# Patient Record
Sex: Female | Born: 1950
Health system: Southern US, Community
[De-identification: ages and names within clinical notes are randomized; demographics above are authoritative.]

## PROBLEM LIST (undated history)

## (undated) ENCOUNTER — Emergency Department (HOSPITAL_BASED_OUTPATIENT_CLINIC_OR_DEPARTMENT_OTHER): Admission: EM | Payer: PPO | Source: Home / Self Care

## (undated) DIAGNOSIS — Z8543 Personal history of malignant neoplasm of ovary: Secondary | ICD-10-CM

## (undated) DIAGNOSIS — F4322 Adjustment disorder with anxiety: Secondary | ICD-10-CM

## (undated) DIAGNOSIS — F419 Anxiety disorder, unspecified: Secondary | ICD-10-CM

## (undated) DIAGNOSIS — M8580 Other specified disorders of bone density and structure, unspecified site: Secondary | ICD-10-CM

## (undated) DIAGNOSIS — R911 Solitary pulmonary nodule: Secondary | ICD-10-CM

## (undated) DIAGNOSIS — K219 Gastro-esophageal reflux disease without esophagitis: Secondary | ICD-10-CM

## (undated) DIAGNOSIS — H269 Unspecified cataract: Secondary | ICD-10-CM

## (undated) DIAGNOSIS — Z8509 Personal history of malignant neoplasm of other digestive organs: Secondary | ICD-10-CM

## (undated) DIAGNOSIS — M1990 Unspecified osteoarthritis, unspecified site: Secondary | ICD-10-CM

## (undated) DIAGNOSIS — K2270 Barrett's esophagus without dysplasia: Secondary | ICD-10-CM

## (undated) DIAGNOSIS — E785 Hyperlipidemia, unspecified: Secondary | ICD-10-CM

## (undated) DIAGNOSIS — C801 Malignant (primary) neoplasm, unspecified: Secondary | ICD-10-CM

## (undated) DIAGNOSIS — R011 Cardiac murmur, unspecified: Secondary | ICD-10-CM

## (undated) HISTORY — DX: Malignant (primary) neoplasm, unspecified: C80.1

## (undated) HISTORY — DX: Gastro-esophageal reflux disease without esophagitis: K21.9

## (undated) HISTORY — DX: Unspecified cataract: H26.9

## (undated) HISTORY — DX: Solitary pulmonary nodule: R91.1

## (undated) HISTORY — PX: ARTHROSCOPIC REPAIR ACL: SUR80

## (undated) HISTORY — DX: Barrett's esophagus without dysplasia: K22.70

## (undated) HISTORY — DX: Unspecified osteoarthritis, unspecified site: M19.90

## (undated) HISTORY — PX: TONSILLECTOMY: SUR1361

## (undated) HISTORY — DX: Cardiac murmur, unspecified: R01.1

## (undated) HISTORY — DX: Hyperlipidemia, unspecified: E78.5

## (undated) HISTORY — PX: BREAST EXCISIONAL BIOPSY: SUR124

## (undated) HISTORY — DX: Other specified disorders of bone density and structure, unspecified site: M85.80

## (undated) HISTORY — PX: ABDOMINAL HYSTERECTOMY: SHX81

## (undated) HISTORY — DX: Personal history of malignant neoplasm of other digestive organs: Z85.09

## (undated) HISTORY — DX: Personal history of malignant neoplasm of ovary: Z85.43

## (undated) HISTORY — DX: Anxiety disorder, unspecified: F41.9

## (undated) HISTORY — PX: FEMUR FRACTURE SURGERY: SHX633

## (undated) HISTORY — DX: Adjustment disorder with anxiety: F43.22

---
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
## 2001-04-09 HISTORY — PX: ANKLE SURGERY: SHX546

## 2007-07-09 HISTORY — PX: VULVAR LESION REMOVAL: SHX5391

## 2007-11-08 HISTORY — PX: INGUINAL HERNIA REPAIR: SHX194

## 2016-04-09 DIAGNOSIS — C569 Malignant neoplasm of unspecified ovary: Secondary | ICD-10-CM

## 2016-04-09 HISTORY — DX: Malignant neoplasm of unspecified ovary: C56.9

## 2016-04-09 HISTORY — PX: APPENDECTOMY: SHX54

## 2016-10-03 LAB — BASIC METABOLIC PANEL
Glucose: 156
Glucose: 172
Potassium: 3.5 (ref 3.4–5.3)
Potassium: 3.9 (ref 3.4–5.3)
Sodium: 137 (ref 137–147)
Sodium: 139 (ref 137–147)

## 2016-10-03 LAB — CBC AND DIFFERENTIAL: Hemoglobin: 12.2 (ref 12.0–16.0)

## 2016-10-04 LAB — CBC AND DIFFERENTIAL
HCT: 36 (ref 36–46)
Hemoglobin: 11.7 — AB (ref 12.0–16.0)
Platelets: 192 (ref 150–399)
WBC: 8.9

## 2016-10-04 LAB — BASIC METABOLIC PANEL
BUN: 12 (ref 4–21)
Creatinine: 0.6 (ref 0.5–1.1)
Glucose: 109
Potassium: 3.6 (ref 3.4–5.3)
Sodium: 137 (ref 137–147)

## 2016-10-05 LAB — BASIC METABOLIC PANEL: Potassium: 3.5 (ref 3.4–5.3)

## 2016-10-06 LAB — BASIC METABOLIC PANEL
BUN: 11 (ref 4–21)
BUN: 11 (ref 4–21)
Creatinine: 0.5 (ref 0.5–1.1)
Creatinine: 0.5 (ref 0.5–1.1)
Glucose: 80
Glucose: 80
Potassium: 3.4 (ref 3.4–5.3)
Sodium: 138 (ref 137–147)
Sodium: 138 (ref 137–147)

## 2016-10-06 LAB — CBC AND DIFFERENTIAL
HCT: 35 — AB (ref 36–46)
Hemoglobin: 11.5 — AB (ref 12.0–16.0)
Neutrophils Absolute: 6
Platelets: 164 (ref 150–399)
WBC: 7.7

## 2016-10-07 LAB — BASIC METABOLIC PANEL
BUN: 11 (ref 4–21)
Creatinine: 0.5 (ref 0.5–1.1)
Glucose: 82
Potassium: 3.4 (ref 3.4–5.3)
Sodium: 138 (ref 137–147)

## 2016-10-07 LAB — CBC AND DIFFERENTIAL
HCT: 35 — AB (ref 36–46)
Hemoglobin: 11.5 — AB (ref 12.0–16.0)
Neutrophils Absolute: 7
Platelets: 202 (ref 150–399)
WBC: 8

## 2016-10-08 LAB — BASIC METABOLIC PANEL: Potassium: 3.7 (ref 3.4–5.3)

## 2016-10-09 LAB — BASIC METABOLIC PANEL: Potassium: 3.3 — AB (ref 3.4–5.3)

## 2017-05-11 LAB — CBC AND DIFFERENTIAL
HCT: 42 (ref 36–46)
Hemoglobin: 13.5 (ref 12.0–16.0)
Neutrophils Absolute: 2
Platelets: 206 (ref 150–399)
WBC: 4.1

## 2017-05-11 LAB — LIPID PANEL
Cholesterol: 215 — AB (ref 0–200)
HDL: 103 — AB (ref 35–70)
LDL Cholesterol: 105
LDl/HDL Ratio: 2.1
Triglycerides: 37 — AB (ref 40–160)

## 2017-05-11 LAB — HEPATIC FUNCTION PANEL
ALT: 22 (ref 7–35)
AST: 26 (ref 13–35)
Alkaline Phosphatase: 54 (ref 25–125)
Bilirubin, Total: 0.7

## 2017-05-11 LAB — BASIC METABOLIC PANEL
BUN: 21 (ref 4–21)
Creatinine: 0.7 (ref 0.5–1.1)
Glucose: 111
Potassium: 4.4 (ref 3.4–5.3)
Sodium: 141 (ref 137–147)

## 2017-05-11 LAB — HEMOGLOBIN A1C: Hemoglobin A1C: 6

## 2018-05-21 LAB — BASIC METABOLIC PANEL
BUN: 19 (ref 4–21)
Creatinine: 0.8 (ref 0.5–1.1)
Glucose: 101
Potassium: 4.1 (ref 3.4–5.3)
Sodium: 142 (ref 137–147)

## 2018-05-21 LAB — HEPATIC FUNCTION PANEL
ALT: 12 (ref 7–35)
AST: 18 (ref 13–35)
Alkaline Phosphatase: 47 (ref 25–125)
Bilirubin, Total: 0.7

## 2018-05-21 LAB — HEMOGLOBIN A1C: Hemoglobin A1C: 6.1

## 2018-05-21 LAB — CBC AND DIFFERENTIAL
HCT: 43 (ref 36–46)
Hemoglobin: 13.6 (ref 12.0–16.0)
Neutrophils Absolute: 3
Platelets: 218 (ref 150–399)
WBC: 4.5

## 2018-09-11 ENCOUNTER — Telehealth: Payer: Self-pay

## 2018-09-11 NOTE — Telephone Encounter (Signed)
Okay to schedule patient for a new patient appointment with Dr. Deborra Medina, thank you!

## 2018-09-11 NOTE — Telephone Encounter (Signed)
Copied from [place] [id number]. Topic: Appointment Scheduling - Scheduling Inquiry for Clinic >> Sep 10, 2018  4:03 PM Erick Blinks wrote: Reason for CRM: Pt is requesting to be scheduled with Dr. Deborra Medina, she is a medicare Health advantage, VM available. Virtual visit is  [phone]

## 2018-09-11 NOTE — Telephone Encounter (Signed)
Yes okay to schedule next week.

## 2018-09-16 ENCOUNTER — Encounter: Payer: Self-pay | Admitting: Family Medicine

## 2018-09-16 ENCOUNTER — Ambulatory Visit (INDEPENDENT_AMBULATORY_CARE_PROVIDER_SITE_OTHER): Payer: PPO | Admitting: Family Medicine

## 2018-09-16 VITALS — BP 110/90 | Ht 64.0 in | Wt 138.0 lb

## 2018-09-16 DIAGNOSIS — E782 Mixed hyperlipidemia: Secondary | ICD-10-CM | POA: Insufficient documentation

## 2018-09-16 DIAGNOSIS — C181 Malignant neoplasm of appendix: Secondary | ICD-10-CM | POA: Diagnosis not present

## 2018-09-16 DIAGNOSIS — E785 Hyperlipidemia, unspecified: Secondary | ICD-10-CM | POA: Diagnosis not present

## 2018-09-16 DIAGNOSIS — K219 Gastro-esophageal reflux disease without esophagitis: Secondary | ICD-10-CM | POA: Diagnosis not present

## 2018-09-16 DIAGNOSIS — Z8543 Personal history of malignant neoplasm of ovary: Secondary | ICD-10-CM | POA: Diagnosis not present

## 2018-09-16 DIAGNOSIS — F419 Anxiety disorder, unspecified: Secondary | ICD-10-CM | POA: Diagnosis not present

## 2018-09-16 MED ORDER — SERTRALINE HCL 25 MG PO TABS: ORAL_TABLET | ORAL | 0 refills | Status: DC

## 2018-09-16 NOTE — Assessment & Plan Note (Signed)
?   Ovarian and appendical CA.  Referral placed to oncology.  Awaiting records.

## 2018-09-16 NOTE — Assessment & Plan Note (Signed)
Compliant with statin- had blood work done in January.  Awaiting records.

## 2018-09-16 NOTE — Assessment & Plan Note (Signed)
She would like to wean completely off zoloft as she felt her anxiety was situational- I advised the following- since she has several 100 mg tablets of zoloft at home that are scored and easy to cut in half- she will take 50 mg (1/2 of her 100 mg tabs) every other day for 2 weeks. I then sent in 25 mg tablets of zoloft and advised that she take 1 tablet every other day for 1 weeks, then 1/2 tablet every other day for one week and stop.  She will call me mid wean and let me know how she is doing. The patient indicates understanding of these issues and agrees with the plan.'

## 2018-09-16 NOTE — Progress Notes (Signed)
Virtual Visit via Video   Due to the COVID-19 pandemic, this visit was completed with telemedicine (audio/video) technology to reduce patient and provider exposure as well as to preserve personal protective equipment.   I connected with Alta Corning by a video enabled telemedicine application and verified that I am speaking with the correct person using two identifiers. Location patient: Home Location provider: [facility], Office Persons participating in the virtual visit: Denetta Fei, Arnette Norris, MD   I discussed the limitations of evaluation and management by telemedicine and the availability of in person appointments. The patient expressed understanding and agreed to proceed.  Care Team   Patient Care Team: Lucille Passy, MD as PCP - General (Family Medicine)  Subjective:   HPI:   Very pleasant 68 year old female with h/o ovarian CA, HLD and anxiety who would like to establish care.  Moved to the area from [place] 3 weeks ago because she retired.  Her last CPE was in January and her labs were ok and we will get those records. Mammogram UTD. Last BMD was 10-years-ago. Colonoscopy is UTD. Will wait for records for blood work and immunizations.   She has a H/O Ovarian CA,  This was diagnosed in 06/2016.  S/p TAH/BSO.  Then determined the appendix was involved and appendectomy was done after that was found. She  will need a referral to Oncology for September. We have mailed records request to her.  Anxiety- She has anxiety and is stable taking Sertraline 100mg  1qd and would like to initiate weaning process.  She started this last year when work changed which was very stressful.    GAD 7 : Generalized Anxiety Score 09/16/2018  Nervous, Anxious, on Edge 0  Control/stop worrying 0  Worry too much - different things 0  Trouble relaxing 0  Restless 0  Easily annoyed or irritable 0  Afraid - awful might happen 0  Total GAD 7 Score 0  Anxiety Difficulty Not difficult at  all    Depression screen [facility] 2/9 09/16/2018  Decreased Interest 0  Down, Depressed, Hopeless 0  PHQ - 2 Score 0   HLD-   Taking Simvastatin 20mg .  No results found for: CHOL, HDL, LDLCALC, LDLDIRECT, TRIG, CHOLHDL    GERD- She would like to see about weaning off of Omeprazole as well.  ROS   Patient Active Problem List   Diagnosis Date Noted  . Anxiety 09/16/2018  . History of ovarian cancer 09/16/2018  . HLD (hyperlipidemia) 09/16/2018    Social History   Tobacco Use  . Smoking status: Former Smoker    Types: Cigarettes    Last attempt to quit: 1993    Years since quitting: 27.4  . Smokeless tobacco: Never Used  Substance Use Topics  . Alcohol use: Yes    Comment: Occas    Current Outpatient Medications:  .  omeprazole (PRILOSEC) 20 MG capsule, Take 20 mg by mouth daily., Disp: , Rfl:  .  sertraline (ZOLOFT) 100 MG tablet, Take 100 mg by mouth daily., Disp: , Rfl:  .  simvastatin (ZOCOR) 20 MG tablet, Take 20 mg by mouth at bedtime., Disp: , Rfl:   No Known Allergies  Objective:  BP 110/90 (Cuff Size: Normal)   Ht 5\' 4"  (1.626 m)   Wt 138 lb (62.6 kg)   BMI 23.69 kg/m   VITALS: Per patient if applicable, see vitals. GENERAL: Alert, appears well and in no acute distress. HEENT: Atraumatic, conjunctiva clear, no obvious  abnormalities on inspection of external nose and ears. NECK: Normal movements of the head and neck. CARDIOPULMONARY: No increased WOB. Speaking in clear sentences. I:E ratio WNL.  MS: Moves all visible extremities without noticeable abnormality. PSYCH: Pleasant and cooperative, well-groomed. Speech normal rate and rhythm. Affect is appropriate. Insight and judgement are appropriate. Attention is focused, linear, and appropriate.  NEURO: CN grossly intact. Oriented as arrived to appointment on time with no prompting. Moves both UE equally.  SKIN: No obvious lesions, wounds, erythema, or cyanosis noted on face or hands.  Depression screen PHQ  2/9 09/16/2018  Decreased Interest 0  Down, Depressed, Hopeless 0  PHQ - 2 Score 0    Assessment and Plan:   Hermila was seen today for new patient (initial visit) and addendum.  Diagnoses and all orders for this visit:  Anxiety  History of ovarian cancer  Hyperlipidemia, unspecified hyperlipidemia type    . COVID-19 Education: The signs and symptoms of COVID-19 were discussed with the patient and how to seek care for testing if needed. The importance of social distancing was discussed today. . Reviewed expectations re: course of current medical issues. . Discussed self-management of symptoms. . Outlined signs and symptoms indicating need for more acute intervention. . Patient verbalized understanding and all questions were answered. Marland Kitchen Health Maintenance issues including appropriate healthy diet, exercise, and smoking avoidance were discussed with patient. . See orders for this visit as documented in the electronic medical record.  Arnette Norris, MD  Records requested if needed. Time spent: 30 minutes, of which >50% was spent in obtaining information about her symptoms, reviewing her previous labs, evaluations, and treatments, counseling her about her condition (please see the discussed topics above), and developing a plan to further investigate it; she had a number of questions which I addressed.

## 2018-09-16 NOTE — Assessment & Plan Note (Signed)
Discussed GERD friendly diet.  And H2 blockers as first choice.  She would like to stop all acid reducers now that she is not under stress and eating better and keep me updated.

## 2018-10-01 ENCOUNTER — Telehealth: Payer: Self-pay | Admitting: Family Medicine

## 2018-10-01 NOTE — Telephone Encounter (Signed)
Can you get more information?  How does she feel crummy?

## 2018-10-01 NOTE — Telephone Encounter (Signed)
Dr. Aron - please advise 

## 2018-10-01 NOTE — Telephone Encounter (Signed)
Patient is calling stating she was suppose to let PCP know how she felt on new medication after 2 weeks: sertraline (ZOLOFT) 25 MG  Patient has felt okay, but the past two days patient states she has felt crummy.  Patient Call back # [phone]

## 2018-10-03 NOTE — Telephone Encounter (Signed)
Pt will try to take 1/2 tab qd x14d then 1/2 tab qod x14d for a slower titration and a more stable consistency of a low dose for the first 14d/thx dmf

## 2018-10-30 ENCOUNTER — Encounter: Payer: Self-pay | Admitting: Family Medicine

## 2018-10-30 NOTE — Progress Notes (Signed)
[facility] Provider Based License Group/thx dmf

## 2018-10-30 NOTE — Progress Notes (Signed)
[facility] Provider Based License Group/thx dmf

## 2018-10-31 ENCOUNTER — Encounter: Payer: Self-pay | Admitting: Family Medicine

## 2018-10-31 ENCOUNTER — Telehealth: Payer: Self-pay

## 2018-10-31 DIAGNOSIS — Z1159 Encounter for screening for other viral diseases: Secondary | ICD-10-CM

## 2018-10-31 DIAGNOSIS — E785 Hyperlipidemia, unspecified: Secondary | ICD-10-CM

## 2018-10-31 NOTE — Progress Notes (Signed)
[facility]/thx dmf

## 2018-10-31 NOTE — Progress Notes (Signed)
[facility]/thx dmf

## 2018-10-31 NOTE — Progress Notes (Signed)
[facility]/thx dmf

## 2018-10-31 NOTE — Progress Notes (Signed)
[facility]/thx dmf

## 2018-10-31 NOTE — Telephone Encounter (Signed)
I advised pt that we received all of her records and that we need to schedule the following in this order:  1.)AWV with [facility] - PNV-23 vaccine &  fasting labs same day (orders entered) 2.)Follow-up with Dr. Deborra Medina within 2 weeks to discuss labs  She will call back to schedule/thx dmf

## 2018-10-31 NOTE — Progress Notes (Signed)
[facility]/thx dmf

## 2018-10-31 NOTE — Progress Notes (Signed)
[facility]/thx dmf

## 2018-10-31 NOTE — Progress Notes (Signed)
[facility]/thx dmf

## 2018-10-31 NOTE — Progress Notes (Signed)
[facility]/thx dmf

## 2018-11-11 ENCOUNTER — Telehealth: Payer: Self-pay | Admitting: Family Medicine

## 2018-11-11 DIAGNOSIS — C181 Malignant neoplasm of appendix: Secondary | ICD-10-CM

## 2018-11-11 DIAGNOSIS — D373 Neoplasm of uncertain behavior of appendix: Secondary | ICD-10-CM

## 2018-11-11 DIAGNOSIS — Z8543 Personal history of malignant neoplasm of ovary: Secondary | ICD-10-CM

## 2018-11-11 NOTE — Telephone Encounter (Signed)
Relation to pt: self  Call back number: [phone]    Reason for call:  Patient states due to her being a cancer survivor CT checks are done every few months, patient requesting CT orders, please advise

## 2018-11-13 NOTE — Telephone Encounter (Signed)
TA-Pt requesting CT orders to monitor and be certain her cancer is still in remission/plz advise/thx dmf

## 2018-11-14 ENCOUNTER — Encounter: Payer: Self-pay | Admitting: Family Medicine

## 2018-11-14 ENCOUNTER — Telehealth: Payer: Self-pay | Admitting: Family Medicine

## 2018-11-14 NOTE — Telephone Encounter (Signed)
I did refer her in June, not sure what happened.  I will place another referral and ordered the CT scan.

## 2018-11-14 NOTE — Telephone Encounter (Signed)
Copied from [place] [id number]. Topic: General - Call Back - No Documentation >> Nov 14, 2018  3:36 PM Erick Blinks wrote: Reason for CRM: Pt is requesting call back, working on receiving medical records. Hep C, code required from office/PCP for Health team advantage. [phone] Shingles shot needed -prescription needed for major price difference. Best contact: [phone]

## 2018-11-14 NOTE — Telephone Encounter (Signed)
TA-Pt does not have an Oncologist in this area since moving here in March during COVID/but the Shepherd in [place] said that she needs a CT chest with Abd & Pelvis/plz advise/thx dmf

## 2018-11-14 NOTE — Telephone Encounter (Signed)
The oncologist's typically want to order and follow these.  I know we referred her months ago to oncology but I don't see any notes.  Can you help me with this? Thanks so much!

## 2018-11-14 NOTE — Addendum Note (Signed)
Addended by: Lucille Passy on: 11/14/2018 10:03 AM   Modules accepted: Orders

## 2018-11-18 ENCOUNTER — Encounter: Payer: Self-pay | Admitting: *Deleted

## 2018-11-18 NOTE — Progress Notes (Signed)
Received referral for this patient who has been treated in [place], but has moved to this area and needs to establish care here. We received referral from her new PCP and did not receive any oncology records.  Reached out to Alta Corning to introduce myself as the office RN Navigator and explain our new patient process. Reviewed the reason for their referral. Provided address and directions to the office including call back phone number. Reviewed with patient any concerns they may have or any possible barriers to attending their appointment.   Informed patient about my role as a navigator and that I will meet with them prior to their New Patient appointment and more fully discuss what services I can provide.   Instructed patient to reach out to her physician in [place] to have medical records faxed here. Once we have those records we will schedule patient with Dr Maylon Peppers. She understood and collected all the needed information necessary to complete the release of information form. I will continue to follow up for medical records, and then appointment scheduling.

## 2018-11-19 ENCOUNTER — Encounter: Payer: Self-pay | Admitting: Family Medicine

## 2018-11-20 ENCOUNTER — Other Ambulatory Visit: Payer: Self-pay | Admitting: Family Medicine

## 2018-11-20 MED ORDER — PNEUMOCOCCAL VAC POLYVALENT 25 MCG/0.5ML IJ INJ: 0.5000 mL | INJECTION | Freq: Once | INTRAMUSCULAR | 0 refills | Status: AC

## 2018-11-20 MED ORDER — ZOSTAVAX 19400 UNT/0.65ML ~~LOC~~ SUSR: 0.6500 mL | Freq: Once | SUBCUTANEOUS | 0 refills | Status: AC

## 2018-11-21 ENCOUNTER — Telehealth: Payer: Self-pay

## 2018-11-21 NOTE — Telephone Encounter (Signed)

## 2018-11-21 NOTE — Telephone Encounter (Signed)
Copied from [place] [id number]. Topic: General - Other >> Nov 21, 2018 11:12 AM Keene Breath wrote: Reason for CRM: [place] is calling on behalf of patient to find out the benefits screening code for the HEP C.  She stated that they do not have the coding for this particular screening.  Please call the Benefits and Eligibility Line to get this for the patient.  # is [phone].  Once this is obtained, please call patient back at [phone]

## 2018-11-24 ENCOUNTER — Other Ambulatory Visit (INDEPENDENT_AMBULATORY_CARE_PROVIDER_SITE_OTHER): Payer: PPO

## 2018-11-24 ENCOUNTER — Encounter: Payer: Self-pay | Admitting: Family Medicine

## 2018-11-24 DIAGNOSIS — Z1159 Encounter for screening for other viral diseases: Secondary | ICD-10-CM | POA: Diagnosis not present

## 2018-11-24 DIAGNOSIS — E785 Hyperlipidemia, unspecified: Secondary | ICD-10-CM | POA: Diagnosis not present

## 2018-11-24 LAB — LIPID PANEL
Cholesterol: 183 mg/dL (ref 0–200)
HDL: 86.6 mg/dL (ref >39.00)
LDL Cholesterol: 78 mg/dL (ref 0–99)
NonHDL: 96.34
Total CHOL/HDL Ratio: 2
Triglycerides: 91 mg/dL (ref 0.0–149.0)
VLDL: 18.2 mg/dL (ref 0.0–40.0)

## 2018-11-25 ENCOUNTER — Encounter: Payer: Self-pay | Admitting: *Deleted

## 2018-11-25 NOTE — Progress Notes (Signed)
This office still has not received medical records. Patient states she spoke with her old MD office and they stated they would mail them last week. Will wait until end of week and reach out to them. Patient provided the following information:  [facility] Dr Orvan Falconer [phone]  Martin Majestic ahead and scheduled patient for new patient appointment with Dr Maylon Peppers. Will continue to follow for receipt of old patient records.

## 2018-11-25 NOTE — Telephone Encounter (Signed)
You're the best!

## 2018-11-25 NOTE — Telephone Encounter (Signed)
Hey there,   Z11.59, which is on the order for the Hep C screen this is if she is born between [place] If patient is considered high risk , it would be Z72.89 and F19.20  Let me know if I can help further.   Thanks,  [employer]

## 2018-11-25 NOTE — Telephone Encounter (Signed)
I have no idea.  Will route to [facility], she is amazing and she can either help [place] or send [place] to the right person.  Thanks!

## 2018-11-25 NOTE — Telephone Encounter (Signed)
TA-Do you know what this "benefits screening code for the Hep-C" is?

## 2018-11-26 ENCOUNTER — Encounter: Payer: Self-pay | Admitting: Family Medicine

## 2018-11-26 LAB — HEPATITIS C ANTIBODY
Hepatitis C Ab: NONREACTIVE
SIGNAL TO CUT-OFF: 0.01 (ref <1.00)

## 2018-11-27 ENCOUNTER — Encounter: Payer: Self-pay | Admitting: Family Medicine

## 2018-11-27 ENCOUNTER — Encounter: Payer: Self-pay | Admitting: *Deleted

## 2018-11-27 MED ORDER — SHINGRIX 50 MCG/0.5ML IM SUSR: 0.5000 mL | Freq: Once | INTRAMUSCULAR | 0 refills | Status: AC

## 2018-11-27 NOTE — Telephone Encounter (Signed)
shingrix eRX sent to pharmacy on file.

## 2018-11-27 NOTE — Progress Notes (Signed)
This office still has not received medical records from her previous office.  Called: [facility] Dr Orvan Falconer [phone]  Spoke to the Medical Record Department and they will fax records.   Received medical records via fax. Notified patient that records have been received.

## 2018-11-27 NOTE — Telephone Encounter (Signed)
TA-I sent pt a MyChart message with the Hep-C code that you were able to get/She is also requesting that a shingrix Rx be sent to the pharmacy/She did just receive a PNV & Flu today and sees and Oncologist/plz advise/thx dmf

## 2018-11-27 NOTE — Telephone Encounter (Signed)
Sent a pt the message as she will call her ins company/thx dmf

## 2018-11-28 NOTE — Telephone Encounter (Signed)
Pt aware/thx dmf 

## 2018-12-05 DIAGNOSIS — D373 Neoplasm of uncertain behavior of appendix: Secondary | ICD-10-CM | POA: Insufficient documentation

## 2018-12-05 NOTE — Progress Notes (Signed)
Kristen Stephenson NOTE  Patient Care Team: Lucille Passy, MD as PCP - General (Family Medicine)  HEME/ONC OVERVIEW: 1. 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 (Previously treated at [facility]) -07/2016: surgery (see below) found a low-grade mucinous neoplasm of the appendix, Grade 1, well-differentiated, involving the adnexal structure w/ an 8cm right pelvic mass (mucinous and cystic components, extensive extruded mucin); no gross evidence of disease within the peritoneal cavity  -09/2016: tumor debulking with HIPEC  Path showed microscopic foci of mucin in the omentum, areas of mucin with granulomatous and giant cell reactions in peritoneal implants; no evidence of tumor identified  -06/2017: NED except subcentimeter pulmonary nodules on CT -On observation   TREATMENT REGIMEN:  08/02/2016: robotic-assisted right ovary resection, lap hysterectomy, BSO, and appendectomy   10/03/2016: ex lap with tumor debulking, omentectomy, partial cecectomy, diaphragmatic peritoneal stripping, and HIPEC with mitomycin   ASSESSMENT & PLAN:   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  Pathology showed an incidental low-grade mucinous neoplasm of the appendix, well-differentiated, involving the adnexal structure with an 8cm right pelvic mass containing mucinous and cystic components and extensive extruded mucin.  There was no evidence of gross disease within the peritoneal cavity.  Patient then underwent ex lap with tumor debulking, omentectomy, partial cecectomy, diaphragmatic peritoneal stripping, and  HIPEC with mitomycin.  She recovered very nicely from the surgery, and her surveillance scans in 06/2017 was negative for any recurrent disease except for subcentimeter pulmonary nodules. -I reviewed the records in detail with the patient -We discussed the diagnosis, treatment options, and prognosis of low-grade mucinous neoplasm with appendix.  We also reviewed NCCN guidelines detail -In general, the overall survival for low-grade mucinous neoplasm of the appendix after complete the resection is excellent, with a rate of disease recurrence ranging from 37% in patients with acellular mucin deposits on the peritoneal surface. -There is no consensus on the frequency or duration of imaging surveillance after complete resection.  In light of the patient's T4 disease, she would benefit from at least yearly CT x 4 years, followed by every 2 years until 10 years. -Therefore, I have ordered CT CAP tentatively in early 12/2018 to monitor for disease recurrence, and if NED, then we will repeat the scan in 12/2019   Orders Placed This Encounter  Procedures  . CT CHEST W CONTRAST    Standing Status:   Future    Standing Expiration Date:   12/09/2019    Order Specific Question:   ** REASON FOR EXAM (FREE TEXT)    Answer:   Mucinous neoplasm of appendix, surveillance; previous pulmonary nodules    Order Specific Question:   If indicated for the ordered procedure, I authorize the administration of contrast media per Radiology protocol    Answer:   Yes    Order Specific Question:   Preferred imaging location?    Answer:   [facility] Specific Question:   Radiology Contrast Protocol - do NOT remove file path    Answer:   \\[email or web address].pdf  . CT ABDOMEN PELVIS W CONTRAST    Standing Status:   Future    Standing Expiration Date:  12/09/2019    Order Specific Question:   ** REASON FOR EXAM (FREE TEXT)    Answer:   Mucinous neoplasm of the appendix, on surveillance     Order Specific Question:   If indicated for the ordered procedure, I authorize the administration of contrast media per Radiology protocol    Answer:   Yes    Order Specific Question:   Preferred imaging location?    Answer:   [facility] Specific Question:   Is Oral Contrast requested for this exam?    Answer:   Yes, Per Radiology protocol    Order Specific Question:   Radiology Contrast Protocol - do NOT remove file path    Answer:   \\[email or web address].pdf  . CBC with Differential ([facility] Only)    Standing Status:   Future    Standing Expiration Date:   01/13/2020  . CMP ([facility] only)    Standing Status:   Future    Standing Expiration Date:   01/13/2020   A total of more than 45 minutes were spent face-to-face with the patient during this encounter and over half of that time was spent on counseling and coordination of care as outlined above.    All questions were answered. The patient knows to call the clinic with any problems, questions or concerns.  Tentatively return in 1 year for labs, imaging results and clinic appt.   Tish Men, MD 12/09/2018 11:48 AM   CHIEF COMPLAINTS/PURPOSE OF CONSULTATION:  "I am feeling good"  HISTORY OF PRESENTING ILLNESS:  Kristen Stephenson 68 y.o. female is here because of history of low-grade mucinous neoplasm of the appendix.  Patient received her previous oncology care at [facility], and recently moved to [place].  patient was found with an abnormality in the adnexa imaging in 2018, and she underwent robotic-assisted right ovary resection, laparoscopic hysterectomy, BSO, and appendectomy.  Pathology showed an incidental low-grade mucinous neoplasm of the appendix, well-differentiated, involving the adnexal structure with an 8cm right pelvic mass containing mucinous and cystic components and extensive extruded mucin.  There was no evidence of gross disease within the peritoneal  cavity.  Patient then underwent ex lap with tumor debulking, omentectomy, partial septectomy, diaphragmatic peritoneal stripping, and HIPEC with mitomycin.  She recovered very nicely from the surgery, and her surveillance scans in 06/2017 was negative for any recurrent disease except for subcentimeter pulmonary nodules.  She reports having had one more scan in 12/2017 that showed stable pulmonary nodules, but no records were available.  Today, patient reports that she feels very well, and denies any complaint.  She still has occasional lower abdominal discomfort, but it lasts only a few seconds and resolves without any intervention.  She has remote history of tobacco use (approximately 15 pack years), but she quit smoking in early 1990s.   I have reviewed her chart and materials related to her cancer extensively and collaborated history with the patient. Summary of oncologic history is as follows: Oncology History   No history exists.    MEDICAL HISTORY:  Past Medical History:  Diagnosis Date  . Adjustment disorder with anxiety   . Anxiety   . Barrett's esophagus    [facility] Provider Based [facility]p-Needs Endoscopy in 2023  . H/O ovarian cancer   . History of malignant neoplasm of appendix   . Hyperlipidemia   . Hyperlipidemia   . Lung nodule   . Osteopenia     SURGICAL HISTORY: Past  Surgical History:  Procedure Laterality Date  . ABDOMINAL HYSTERECTOMY    . ANKLE SURGERY  2003  . APPENDECTOMY  2018   Cancer/S/P resection and chemo  . ARTHROSCOPIC REPAIR ACL Left   . CYST EXCISION Right 1972   Right breast/benign  . FEMUR FRACTURE SURGERY Right    Plate with 5 screws  . INGUINAL HERNIA REPAIR  11/2007  . TONSILLECTOMY    . VULVAR LESION REMOVAL  07/2007   benign    SOCIAL HISTORY: Social History   Socioeconomic History  . Marital status: Married    Spouse name: Not on file  . Number of children: Not on file  . Years of education: Not on file  . Highest education level: Not  on file  Occupational History  . Not on file  Social Needs  . Financial resource strain: Not on file  . Food insecurity    Worry: Not on file    Inability: Not on file  . Transportation needs    Medical: Not on file    Non-medical: Not on file  Tobacco Use  . Smoking status: Former Smoker    Types: Cigarettes    Quit date: 1993    Years since quitting: 27.6  . Smokeless tobacco: Never Used  Substance and Sexual Activity  . Alcohol use: Yes    Comment: Occas  . Drug use: Never  . Sexual activity: Yes    Partners: Male    Birth control/protection: None, Post-menopausal  Lifestyle  . Physical activity    Days per week: Not on file    Minutes per session: Not on file  . Stress: Not on file  Relationships  . Social Herbalist on phone: Not on file    Gets together: Not on file    Attends religious service: Not on file    Active member of club or organization: Not on file    Attends meetings of clubs or organizations: Not on file    Relationship status: Not on file  . Intimate partner violence    Fear of current or ex partner: Not on file    Emotionally abused: Not on file    Physically abused: Not on file    Forced sexual activity: Not on file  Other Topics Concern  . Not on file  Social History Narrative  . Not on file    FAMILY HISTORY: Family History  Problem Relation Age of Onset  . CAD Mother   . Osteoporosis Mother   . Diabetes Father   . Rheum arthritis Sister   . Asthma Sister   . Diabetes Maternal Grandmother        amputation    ALLERGIES:  has No Known Allergies.  MEDICATIONS:  Current Outpatient Medications  Medication Sig Dispense Refill  . simvastatin (ZOCOR) 20 MG tablet Take 20 mg by mouth at bedtime.     No current facility-administered medications for this visit.     REVIEW OF SYSTEMS:   Constitutional: ( - ) fevers, ( - )  chills , ( - ) night sweats Eyes: ( - ) blurriness of vision, ( - ) double vision, ( - ) watery  eyes Ears, nose, mouth, throat, and face: ( - ) mucositis, ( - ) sore throat Respiratory: ( - ) cough, ( - ) dyspnea, ( - ) wheezes Cardiovascular: ( - ) palpitation, ( - ) chest discomfort, ( - ) lower extremity swelling Gastrointestinal:  ( - ) nausea, ( - )  heartburn, ( - ) change in bowel habits Skin: ( - ) abnormal skin rashes Lymphatics: ( - ) new lymphadenopathy, ( - ) easy bruising Neurological: ( - ) numbness, ( - ) tingling, ( - ) new weaknesses Behavioral/Psych: ( - ) mood change, ( - ) new changes  All other systems were reviewed with the patient and are negative.  PHYSICAL EXAMINATION: ECOG PERFORMANCE STATUS: 0 - Asymptomatic  Vitals:   12/09/18 1109  BP: 102/66  Pulse: 78  Resp: 18  Temp: 97.6 F (36.4 C)  SpO2: 100%   Filed Weights   12/09/18 1109  Weight: 144 lb 6.4 oz (65.5 kg)    GENERAL: alert, no distress and comfortable SKIN: skin color, texture, turgor are normal, no rashes or significant lesions EYES: conjunctiva are pink and non-injected, sclera clear OROPHARYNX: no exudate, no erythema; lips, buccal mucosa, and tongue normal  NECK: supple, non-tender LYMPH:  no palpable lymphadenopathy in the cervical LUNGS: clear to auscultation with normal breathing effort HEART: regular rate & rhythm, no murmurs, no lower extremity edema ABDOMEN: soft, non-tender, non-distended, normal bowel sounds Musculoskeletal: no cyanosis of digits and no clubbing  PSYCH: alert & oriented x 3, fluent speech NEURO: no focal motor/sensory deficits  LABORATORY DATA:  I have reviewed the data as listed Lab Results  Component Value Date   WBC 6.0 12/09/2018   HGB 13.5 12/09/2018   HCT 41.3 12/09/2018   MCV 93.4 12/09/2018   PLT 277 12/09/2018   Lab Results  Component Value Date   NA 144 12/09/2018   K 4.6 12/09/2018   CL 105 12/09/2018   CO2 32 12/09/2018    RADIOGRAPHIC STUDIES: I have personally reviewed the radiological images as listed and agreed with the  findings in the report.  PATHOLOGY: I have reviewed the pathology reports as documented in the oncologist history.

## 2018-12-09 ENCOUNTER — Encounter: Payer: Self-pay | Admitting: Hematology

## 2018-12-09 ENCOUNTER — Other Ambulatory Visit: Payer: Self-pay

## 2018-12-09 ENCOUNTER — Inpatient Hospital Stay: Payer: PPO | Attending: Hematology

## 2018-12-09 ENCOUNTER — Encounter: Payer: Self-pay | Admitting: *Deleted

## 2018-12-09 ENCOUNTER — Telehealth: Payer: Self-pay | Admitting: Hematology

## 2018-12-09 ENCOUNTER — Inpatient Hospital Stay: Payer: PPO | Admitting: Hematology

## 2018-12-09 ENCOUNTER — Other Ambulatory Visit: Payer: Self-pay | Admitting: Hematology

## 2018-12-09 VITALS — BP 102/66 | HR 78 | Temp 97.6°F | Resp 18 | Ht 64.0 in | Wt 144.4 lb

## 2018-12-09 DIAGNOSIS — Z90722 Acquired absence of ovaries, bilateral: Secondary | ICD-10-CM

## 2018-12-09 DIAGNOSIS — Z90710 Acquired absence of both cervix and uterus: Secondary | ICD-10-CM | POA: Diagnosis not present

## 2018-12-09 DIAGNOSIS — D373 Neoplasm of uncertain behavior of appendix: Secondary | ICD-10-CM

## 2018-12-09 DIAGNOSIS — Z87891 Personal history of nicotine dependence: Secondary | ICD-10-CM

## 2018-12-09 LAB — CBC WITH DIFFERENTIAL (CANCER CENTER ONLY)
Abs Immature Granulocytes: 0.01 10*3/uL (ref 0.00–0.07)
Basophils Absolute: 0 10*3/uL (ref 0.0–0.1)
Basophils Relative: 1 %
Eosinophils Absolute: 0.2 10*3/uL (ref 0.0–0.5)
Eosinophils Relative: 3 %
HCT: 41.3 % (ref 36.0–46.0)
Hemoglobin: 13.5 g/dL (ref 12.0–15.0)
Immature Granulocytes: 0 %
Lymphocytes Relative: 22 %
Lymphs Abs: 1.3 10*3/uL (ref 0.7–4.0)
MCH: 30.5 pg (ref 26.0–34.0)
MCHC: 32.7 g/dL (ref 30.0–36.0)
MCV: 93.4 fL (ref 80.0–100.0)
Monocytes Absolute: 0.5 10*3/uL (ref 0.1–1.0)
Monocytes Relative: 8 %
Neutro Abs: 4 10*3/uL (ref 1.7–7.7)
Neutrophils Relative %: 66 %
Platelet Count: 277 10*3/uL (ref 150–400)
RBC: 4.42 MIL/uL (ref 3.87–5.11)
RDW: 12 % (ref 11.5–15.5)
WBC Count: 6 10*3/uL (ref 4.0–10.5)
nRBC: 0 % (ref 0.0–0.2)

## 2018-12-09 LAB — CMP (CANCER CENTER ONLY)
ALT: 8 U/L (ref 0–44)
AST: 14 U/L — ABNORMAL LOW (ref 15–41)
Albumin: 4.2 g/dL (ref 3.5–5.0)
Alkaline Phosphatase: 54 U/L (ref 38–126)
Anion gap: 7 (ref 5–15)
BUN: 19 mg/dL (ref 8–23)
CO2: 32 mmol/L (ref 22–32)
Calcium: 10.1 mg/dL (ref 8.9–10.3)
Chloride: 105 mmol/L (ref 98–111)
Creatinine: 0.78 mg/dL (ref 0.44–1.00)
GFR, Est AFR Am: >60 mL/min (ref >60)
GFR, Estimated: >60 mL/min (ref >60)
Glucose, Bld: 105 mg/dL — ABNORMAL HIGH (ref 70–99)
Potassium: 4.6 mmol/L (ref 3.5–5.1)
Sodium: 144 mmol/L (ref 135–145)
Total Bilirubin: 0.6 mg/dL (ref 0.3–1.2)
Total Protein: 6.6 g/dL (ref 6.5–8.1)

## 2018-12-09 NOTE — Telephone Encounter (Signed)
Spoke with patient to confirm appts per 9/1 LOS

## 2018-12-09 NOTE — Progress Notes (Signed)
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 Gave patient "Your Patient Navigator" handout which explains my role, areas in which I am able to help, and all the contact information for myself and the office. Also gave patient MD and Navigator business card. Reviewed with patient the general overview of expected course after initial diagnosis and time frame for all steps to be completed.  New patient packet given to patient which includes staff and office introductions,campus directory, and education on Advance Directives and My Chart.   Patient completed visit with Dr. Zhao  Revisited with patient after MD visit. Patient will need:  CT of CAP/Chest - Scheduled for 12/11/18 at 10am. Patient notified of time, date, location and CT education including NPO for 4 hrs and coming in to get contrast prior to scan. Patient stated she already had contrast.  Patient has already been diagnosed and treated for her cancer. Our office will continue with surveillance. Will discontinue active navigation, but patient is aware they can reach out at any time.   Patient understands all follow up procedures and expectations. They have my number to reach out for any further clarification or additional needs.    

## 2018-12-11 ENCOUNTER — Encounter (HOSPITAL_BASED_OUTPATIENT_CLINIC_OR_DEPARTMENT_OTHER): Payer: Self-pay

## 2018-12-11 ENCOUNTER — Other Ambulatory Visit (HOSPITAL_BASED_OUTPATIENT_CLINIC_OR_DEPARTMENT_OTHER): Payer: PPO

## 2018-12-11 ENCOUNTER — Ambulatory Visit (HOSPITAL_BASED_OUTPATIENT_CLINIC_OR_DEPARTMENT_OTHER)
Admission: RE | Admit: 2018-12-11 | Discharge: 2018-12-11 | Disposition: A | Discharge status: Home / Self Care | Payer: PPO | Source: Ambulatory Visit | Attending: Hematology | Admitting: Hematology

## 2018-12-11 ENCOUNTER — Encounter: Payer: Self-pay | Admitting: *Deleted

## 2018-12-11 ENCOUNTER — Other Ambulatory Visit: Payer: Self-pay

## 2018-12-11 ENCOUNTER — Other Ambulatory Visit: Payer: Self-pay | Admitting: Hematology

## 2018-12-11 DIAGNOSIS — D373 Neoplasm of uncertain behavior of appendix: Secondary | ICD-10-CM | POA: Insufficient documentation

## 2018-12-11 DIAGNOSIS — S22049D Unspecified fracture of fourth thoracic vertebra, subsequent encounter for fracture with routine healing: Secondary | ICD-10-CM | POA: Diagnosis not present

## 2018-12-11 DIAGNOSIS — R195 Other fecal abnormalities: Secondary | ICD-10-CM | POA: Diagnosis not present

## 2018-12-11 DIAGNOSIS — N2889 Other specified disorders of kidney and ureter: Secondary | ICD-10-CM | POA: Diagnosis not present

## 2018-12-11 DIAGNOSIS — J984 Other disorders of lung: Secondary | ICD-10-CM | POA: Diagnosis not present

## 2018-12-11 DIAGNOSIS — I700 Atherosclerosis of aorta: Secondary | ICD-10-CM | POA: Diagnosis not present

## 2018-12-11 DIAGNOSIS — R911 Solitary pulmonary nodule: Secondary | ICD-10-CM | POA: Diagnosis not present

## 2018-12-11 DIAGNOSIS — S2242XD Multiple fractures of ribs, left side, subsequent encounter for fracture with routine healing: Secondary | ICD-10-CM | POA: Diagnosis not present

## 2018-12-11 DIAGNOSIS — Z90721 Acquired absence of ovaries, unilateral: Secondary | ICD-10-CM | POA: Diagnosis not present

## 2018-12-11 DIAGNOSIS — E278 Other specified disorders of adrenal gland: Secondary | ICD-10-CM | POA: Diagnosis not present

## 2018-12-11 DIAGNOSIS — Z90710 Acquired absence of both cervix and uterus: Secondary | ICD-10-CM | POA: Diagnosis not present

## 2018-12-11 MED ORDER — IOHEXOL 300 MG/ML  SOLN
100.0000 mL | Freq: Once | INTRAMUSCULAR | Status: AC | PRN
  Administered 2018-12-11: 100 mL via INTRAVENOUS

## 2018-12-11 NOTE — Progress Notes (Signed)
Called patient and notified her of the following results:   Can you let [name] know that her CT did not show any evidence of disease recurrence? She still has a few subcentimeter pulmonary nodules, but they remain very small and no interval change. We will repeat CT in a year (chest and abdomen) to monitor for any changes.   Thanks.   Kristen Stephenson

## 2018-12-12 NOTE — Telephone Encounter (Signed)
Pt had labs done.

## 2018-12-19 ENCOUNTER — Encounter: Payer: Self-pay | Admitting: Family Medicine

## 2019-04-05 DIAGNOSIS — Z0000 Encounter for general adult medical examination without abnormal findings: Secondary | ICD-10-CM | POA: Insufficient documentation

## 2019-04-05 NOTE — Progress Notes (Signed)
Virtual Visit via Video   Due to the COVID-19 pandemic, this visit was completed with telemedicine (audio/video) technology to reduce patient and provider exposure as well as to preserve personal protective equipment.   I connected with Kristen Stephenson by a video enabled telemedicine application and verified that I am speaking with the correct person using two identifiers. Location patient: Home Location provider: [facility], Office Persons participating in the virtual visit: Brittinie Sare, Arnette Norris, MD   I discussed the limitations of evaluation and management by telemedicine and the availability of in person appointments. The patient expressed understanding and agreed to proceed.  Care Team   Patient Care Team: Lucille Passy, MD as PCP - General (Family Medicine) Tish Men, MD as Consulting Physician (Hematology)  Subjective:   HPI:   Phone: [phone]   Subjective:  Patient presents today for their Welcome to Medicare Exam    I last saw patient when she established care with me on 09/16/18.  Note reviewed.  She had just moved here from [place] 3 weeks prior.   Health Maintenance  Topic Date Due  . MAMMOGRAM  03/13/2020  . COLONOSCOPY  10/08/2022  . TETANUS/TDAP  05/15/2026  . INFLUENZA VACCINE  Completed  . DEXA SCAN  Completed  . Hepatitis C Screening  Completed  . PNA vac Low Risk Adult  Completed   She has a H/O Ovarian CA,  This was diagnosed in 06/2016.  S/p TAH/BSO.  Then determined the appendix was involved and appendectomy was done after that was found.  Now followed locally by Dr. Maylon Peppers.  Preventive Screening-Counseling & Management   Advanced directives: yes  Smoking Status: former smoker- stopped in her 81s Second Hand Smoking status: No smokers in home Alcohol Intake: 2 per week  Risk Factors Regular exercise: walks and hikes almost daily. Diet: tries to eat lowfat Mobility assessment: independent Fall Risk: None  Fall Risk  09/16/2018    Falls in the past year? 0  Follow up Falls evaluation completed     BMI monitoring- elevated BMI noted: Body mass index is 24.72 kg/m. Encouraged need for healthy eating, regular exercise, weight loss.     Cardiac risk factors:  advanced age (older than 39 for men, 62 for women) yes Hyperlipidemia yes No diabetes.  Family History: yes  The 10-year ASCVD risk score Mikey Bussing DC Brooke Bonito., et al., 2013) is: 3.2%   Values used to calculate the score:     Age: 68 years     Sex: Female     Is Non-Hispanic African American: No     Diabetic: No     Tobacco smoker: No     Systolic Blood Pressure: [id number] mmHg     Is BP treated: No     HDL Cholesterol: 86.6 mg/dL     Total Cholesterol: 183 mg/dL  Depression Screen  Depression screen [facility] 2/9 04/06/2019 09/16/2018  Decreased Interest 0 0  Down, Depressed, Hopeless 0 0  PHQ - 2 Score 0 0  Altered sleeping 1 -  Tired, decreased energy 0 -  Change in appetite 0 -  Feeling bad or failure about yourself  0 -  Trouble concentrating 0 -  Moving slowly or fidgety/restless 0 -  Suicidal thoughts 0 -  PHQ-9 Score 1 -    Activities of Daily Living Independent ADLs (toileting, bathing, dressing, transferring, eating) and IADLs (shopping, housekeeping, managing own medications, and handling finances)  Hearing Difficulties:-patient declines    List the Names of  Other Physician/Practitioners you currently use:  Dr. Maylon Peppers, oncology  Immunization History  Administered Date(s) Administered  . Influenza-Unspecified 04/28/2014, 05/09/2015, 05/15/2016, 05/20/2017, 05/22/2018, 11/21/2018  . Pneumococcal Conjugate-13 10/01/2017  . Pneumococcal Polysaccharide-23 11/21/2018  . Tdap 05/15/2016  . Zoster 04/28/2014     Review of Systems  Constitutional: Negative.  Negative for fever and malaise/fatigue.  HENT: Negative.  Negative for congestion and hearing loss.   Eyes: Negative for blurred vision, discharge and redness.  Respiratory: Negative.   Negative for cough and shortness of breath.   Cardiovascular: Negative.  Negative for chest pain, palpitations and leg swelling.  Gastrointestinal: Negative.  Negative for abdominal pain and heartburn.  Genitourinary: Negative.  Negative for dysuria.  Musculoskeletal: Negative.  Negative for falls.  Skin: Negative.  Negative for rash.  Neurological: Negative.  Negative for loss of consciousness and headaches.  Endo/Heme/Allergies: Negative.  Does not bruise/bleed easily.  Psychiatric/Behavioral: Negative.  Negative for depression.  All other systems reviewed and are negative.     Patient Active Problem List   Diagnosis Date Noted  . Welcome to Medicare preventive visit 04/05/2019  . 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 Anxiety 09/16/2018  . History of ovarian cancer 09/16/2018  . HLD (hyperlipidemia) 09/16/2018  . GERD (gastroesophageal reflux disease) 09/16/2018    Social History   Tobacco Use  . Smoking status: Former Smoker    Types: Cigarettes    Quit date: 1993    Years since quitting: 28.0  . Smokeless tobacco: Never Used  Substance Use Topics  . Alcohol use: Yes    Comment: Occas    Current Outpatient Medications:  .  simvastatin (ZOCOR) 20 MG tablet, Take 20 mg by mouth at bedtime., Disp: , Rfl:   No Known Allergies  Objective:  Wt 144 lb (65.3 kg) Comment: per pt.  BMI 24.72 kg/m   VITALS: Per patient if applicable, see vitals. GENERAL: Alert, appears well and in no acute distress. HEENT: Atraumatic, conjunctiva clear, no obvious abnormalities on inspection of external nose and ears. NECK: Normal movements of the head and neck. CARDIOPULMONARY: No increased WOB. Speaking in clear sentences. I:E ratio WNL.  MS: Moves all visible extremities without noticeable abnormality. PSYCH: Pleasant and cooperative, well-groomed. Speech normal rate and rhythm. Affect is appropriate. Insight and judgement are appropriate. Attention is focused, linear,  and appropriate.  NEURO: CN grossly intact. Oriented as arrived to appointment on time with no prompting. Moves both UE equally.  SKIN: No obvious lesions, wounds, erythema, or cyanosis noted on face or hands.  Depression screen [facility] 2/9 04/06/2019 09/16/2018  Decreased Interest 0 0  Down, Depressed, Hopeless 0 0  PHQ - 2 Score 0 0  Altered sleeping 1 -  Tired, decreased energy 0 -  Change in appetite 0 -  Feeling bad or failure about yourself  0 -  Trouble concentrating 0 -  Moving slowly or fidgety/restless 0 -  Suicidal thoughts 0 -  PHQ-9 Score 1 -     . COVID-19 Education: The signs and symptoms of COVID-19 were discussed with the patient and how to seek care for testing if needed. The importance of social distancing was discussed today. . Reviewed expectations re: course of current medical issues. . Discussed self-management of symptoms. . Outlined signs and symptoms indicating need for more acute intervention. . Patient verbalized understanding and all questions were answered. Marland Kitchen Health Maintenance issues including appropriate healthy diet, exercise, and smoking avoidance were discussed with patient. . See orders for  this visit as documented in the electronic medical record.  Arnette Norris, MD    Lab Results  Component Value Date   WBC 6.0 12/09/2018   HGB 13.5 12/09/2018   HCT 41.3 12/09/2018   PLT 277 12/09/2018   GLUCOSE 105 (H) 12/09/2018   CHOL 183 11/24/2018   TRIG 91.0 11/24/2018   HDL 86.60 11/24/2018   LDLCALC 78 11/24/2018   ALT 8 12/09/2018   AST 14 (L) 12/09/2018   NA 144 12/09/2018   K 4.6 12/09/2018   CL 105 12/09/2018   CREATININE 0.78 12/09/2018   BUN 19 12/09/2018   CO2 32 12/09/2018   HGBA1C 6.1 05/21/2018    No results found for: TSH Lab Results  Component Value Date   WBC 6.0 12/09/2018   HGB 13.5 12/09/2018   HCT 41.3 12/09/2018   MCV 93.4 12/09/2018   PLT 277 12/09/2018   Lab Results  Component Value Date   NA 144 12/09/2018   K  4.6 12/09/2018   CO2 32 12/09/2018   GLUCOSE 105 (H) 12/09/2018   BUN 19 12/09/2018   CREATININE 0.78 12/09/2018   BILITOT 0.6 12/09/2018   ALKPHOS 54 12/09/2018   AST 14 (L) 12/09/2018   ALT 8 12/09/2018   PROT 6.6 12/09/2018   ALBUMIN 4.2 12/09/2018   CALCIUM 10.1 12/09/2018   ANIONGAP 7 12/09/2018   Lab Results  Component Value Date   CHOL 183 11/24/2018   Lab Results  Component Value Date   HDL 86.60 11/24/2018   Lab Results  Component Value Date   LDLCALC 78 11/24/2018   Lab Results  Component Value Date   TRIG 91.0 11/24/2018   Lab Results  Component Value Date   CHOLHDL 2 11/24/2018   Lab Results  Component Value Date   HGBA1C 6.1 05/21/2018       Assessment & Plan:   Problem List Items Addressed This Visit      Active Problems   Anxiety     She has anxiety and it was stable taking Sertraline 100mg  1qd.  When she established care with me, she wanted to wean off of it.    Assessment: Current symptoms:. No current suicidal and homicidal ideation. Doing well without orx.  Plan: 1. Medications: none.Marland Kitchen  GAD 7 : Generalized Anxiety Score 04/06/2019 09/16/2018  Nervous, Anxious, on Edge 0 0  Control/stop worrying 0 0  Worry too much - different things 0 0  Trouble relaxing 0 0  Restless 0 0  Easily annoyed or irritable 0 0  Afraid - awful might happen 0 0  Total GAD 7 Score 0 0  Anxiety Difficulty Not difficult at all Not difficult at all          History of ovarian cancer    Last saw oncology, Dr. Maylon Peppers on 12/09/18.  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  Pathology showed an incidental low-grade mucinous neoplasm of the appendix, well-differentiated, involving the adnexal structure with an 8cm right pelvic mass containing mucinous and cystic components and extensive extruded mucin.  There was no evidence of gross disease within the peritoneal cavity.  Patient then  underwent ex lap with tumor debulking, omentectomy, partial cecectomy, diaphragmatic peritoneal stripping, and HIPEC with mitomycin.  She recovered very nicely from the surgery, and her surveillance scans in 06/2017 was negative for any recurrent disease except for subcentimeter pulmonary nodules. -I reviewed the records in detail with the patient -We discussed the diagnosis, treatment options, and prognosis of low-grade mucinous neoplasm with appendix.  We also reviewed NCCN guidelines detail -In general, the overall survival for low-grade mucinous neoplasm of the appendix after complete the resection is excellent, with a rate of disease recurrence ranging from 37% in patients with acellular mucin deposits on the peritoneal surface. -There is no consensus on the frequency or duration of imaging surveillance after complete resection.  In light of the patient's T4 disease, she would benefit from at least yearly CT x 4 years, followed by every 2 years until 10 years. -Therefore, I have ordered CT CAP tentatively in early 12/2018 to monitor for disease recurrence, and if NED, then we will repeat the scan in 12/2019       HLD (hyperlipidemia)    Is the patient taking medications without problems? Yes. Does the patient complain of muscle aches?  No. Trying to exercise on a regular basis? Yes. Compliant with diet? Yes.  Lab Results  Component Value Date   CHOL 183 11/24/2018   HDL 86.60 11/24/2018   LDLCALC 78 11/24/2018   TRIG 91.0 11/24/2018   CHOLHDL 2 11/24/2018   Lab Results  Component Value Date   ALT 8 12/09/2018   AST 14 (L) 12/09/2018   ALKPHOS 54 12/09/2018     BILITOT 0.6 12/09/2018     Dyslipidemia under good control. Continue dietary measures. Continue regular exercise, goal being an average of 40 minutes of moderate to vigorous-intensity aerobic activity 3 or 4 times per week OR as tolerated. Lipid-lowering medications: see medication list.   Due for labs.      Relevant Orders   Lipid panel   TSH   Vitamin D 1,25 dihydroxy   CBC with Differential/Platelet   TSH   GERD (gastroesophageal reflux disease)    Weaned off of omeprazole as well.      Low grade mucinous neoplasm of appendix    Has CTs scheduled for 12/2019.      Welcome to Medicare preventive visit - Primary    Future Appointments  Date Time Provider [place]  04/06/2019  8:40 AM Lucille Passy, MD [facility]  12/10/2019  8:30 AM [facility] None  12/10/2019  9:00 AM [facility]  12/10/2019  9:30 AM [facility]  12/10/2019 10:00 AM Tish Men, MD [facility] None   Welcome to [facility] exam completed- discussed recommended screenings and documented any personalized health advice and referrals for preventive counseling.           I am having Kristen Stephenson. Kristen "Jalysa-Esther" maintain her simvastatin.  No orders of the defined types were placed in this encounter.    Arnette Norris, MD

## 2019-04-05 NOTE — Assessment & Plan Note (Signed)
Is the patient taking medications without problems? Yes. Does the patient complain of muscle aches?  No. Trying to exercise on a regular basis? Yes. Compliant with diet? Yes.  Lab Results  Component Value Date   CHOL 183 11/24/2018   HDL 86.60 11/24/2018   LDLCALC 78 11/24/2018   TRIG 91.0 11/24/2018   CHOLHDL 2 11/24/2018   Lab Results  Component Value Date   ALT 8 12/09/2018   AST 14 (L) 12/09/2018   ALKPHOS 54 12/09/2018   BILITOT 0.6 12/09/2018     Dyslipidemia under good control. Continue dietary measures. Continue regular exercise, goal being an average of 40 minutes of moderate to vigorous-intensity aerobic activity 3 or 4 times per week OR as tolerated. Lipid-lowering medications: see medication list.   Due for labs.

## 2019-04-05 NOTE — Assessment & Plan Note (Signed)
Weaned off of omeprazole as well.

## 2019-04-05 NOTE — Assessment & Plan Note (Addendum)
She has anxiety and it was stable taking Sertraline 100mg  1qd.  When she established care with me, she wanted to wean off of it.    Assessment: Current symptoms:. No current suicidal and homicidal ideation. Doing well without orx.  Plan: 1. Medications: none.Marland Kitchen  GAD 7 : Generalized Anxiety Score 04/06/2019 09/16/2018  Nervous, Anxious, on Edge 0 0  Control/stop worrying 0 0  Worry too much - different things 0 0  Trouble relaxing 0 0  Restless 0 0  Easily annoyed or irritable 0 0  Afraid - awful might happen 0 0  Total GAD 7 Score 0 0  Anxiety Difficulty Not difficult at all Not difficult at all

## 2019-04-05 NOTE — Assessment & Plan Note (Addendum)
Future Appointments  Date Time Provider [place]  04/06/2019  8:40 AM Lucille Passy, MD [facility]  12/10/2019  8:30 AM [facility] None  12/10/2019  9:00 AM [facility]  12/10/2019  9:30 AM [facility]  12/10/2019 10:00 AM Tish Men, MD [facility] None   Welcome to [facility] exam completed- discussed recommended screenings and documented any personalized health advice and referrals for preventive counseling.

## 2019-04-05 NOTE — Assessment & Plan Note (Signed)
Last saw oncology, Dr. Maylon Peppers on 12/09/18.  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  Pathology showed an incidental low-grade mucinous neoplasm of the appendix, well-differentiated, involving the adnexal structure with an 8cm right pelvic mass containing mucinous and cystic components and extensive extruded mucin.  There was no evidence of gross disease within the peritoneal cavity.  Patient then underwent ex lap with tumor debulking, omentectomy, partial cecectomy, diaphragmatic peritoneal stripping, and HIPEC with mitomycin.  She recovered very nicely from the surgery, and her surveillance scans in 06/2017 was negative for any recurrent disease except for subcentimeter pulmonary nodules. -I reviewed the records in detail with the patient -We discussed the diagnosis, treatment options, and prognosis of low-grade mucinous neoplasm with appendix.  We also reviewed NCCN guidelines detail -In general, the overall survival for low-grade mucinous neoplasm of the appendix after complete the resection is excellent, with a rate of disease recurrence ranging from 37% in patients with acellular mucin deposits on the peritoneal surface. -There is no consensus on the frequency or duration of imaging surveillance after complete resection.  In light of the patient's T4 disease, she would benefit from at least yearly CT x 4 years, followed by every 2 years until 10 years. -Therefore, I have ordered CT CAP tentatively in early 12/2018 to monitor for disease recurrence, and if NED, then we will repeat the scan in  12/2019

## 2019-04-05 NOTE — Assessment & Plan Note (Signed)
Has CTs scheduled for 12/2019.

## 2019-04-06 ENCOUNTER — Other Ambulatory Visit: Payer: Self-pay

## 2019-04-06 ENCOUNTER — Ambulatory Visit (INDEPENDENT_AMBULATORY_CARE_PROVIDER_SITE_OTHER): Payer: PPO | Admitting: Family Medicine

## 2019-04-06 VITALS — Wt 144.0 lb

## 2019-04-06 DIAGNOSIS — K219 Gastro-esophageal reflux disease without esophagitis: Secondary | ICD-10-CM

## 2019-04-06 DIAGNOSIS — E785 Hyperlipidemia, unspecified: Secondary | ICD-10-CM

## 2019-04-06 DIAGNOSIS — Z8543 Personal history of malignant neoplasm of ovary: Secondary | ICD-10-CM

## 2019-04-06 DIAGNOSIS — Z0000 Encounter for general adult medical examination without abnormal findings: Secondary | ICD-10-CM | POA: Diagnosis not present

## 2019-04-06 DIAGNOSIS — F419 Anxiety disorder, unspecified: Secondary | ICD-10-CM

## 2019-04-06 DIAGNOSIS — D373 Neoplasm of uncertain behavior of appendix: Secondary | ICD-10-CM

## 2019-04-08 ENCOUNTER — Ambulatory Visit: Payer: PPO | Admitting: *Deleted

## 2019-04-08 ENCOUNTER — Ambulatory Visit: Payer: PPO | Admitting: Family Medicine

## 2019-04-08 ENCOUNTER — Other Ambulatory Visit: Payer: Self-pay

## 2019-04-09 ENCOUNTER — Other Ambulatory Visit (INDEPENDENT_AMBULATORY_CARE_PROVIDER_SITE_OTHER): Payer: PPO

## 2019-04-09 DIAGNOSIS — E785 Hyperlipidemia, unspecified: Secondary | ICD-10-CM | POA: Diagnosis not present

## 2019-04-09 LAB — LIPID PANEL
Cholesterol: 203 mg/dL — ABNORMAL HIGH (ref 0–200)
HDL: 77.5 mg/dL (ref >39.00)
LDL Cholesterol: 99 mg/dL (ref 0–99)
NonHDL: 125.16
Total CHOL/HDL Ratio: 3
Triglycerides: 130 mg/dL (ref 0.0–149.0)
VLDL: 26 mg/dL (ref 0.0–40.0)

## 2019-04-09 LAB — CBC WITH DIFFERENTIAL/PLATELET
Basophils Absolute: 0 10*3/uL (ref 0.0–0.1)
Basophils Relative: 0.7 % (ref 0.0–3.0)
Eosinophils Absolute: 0.2 10*3/uL (ref 0.0–0.7)
Eosinophils Relative: 4.8 % (ref 0.0–5.0)
HCT: 43 % (ref 36.0–46.0)
Hemoglobin: 13.9 g/dL (ref 12.0–15.0)
Lymphocytes Relative: 32.6 % (ref 12.0–46.0)
Lymphs Abs: 1.3 10*3/uL (ref 0.7–4.0)
MCHC: 32.4 g/dL (ref 30.0–36.0)
MCV: 91.7 fl (ref 78.0–100.0)
Monocytes Absolute: 0.4 10*3/uL (ref 0.1–1.0)
Monocytes Relative: 9.8 % (ref 3.0–12.0)
Neutro Abs: 2 10*3/uL (ref 1.4–7.7)
Neutrophils Relative %: 52.1 % (ref 43.0–77.0)
Platelets: 235 10*3/uL (ref 150.0–400.0)
RBC: 4.69 Mil/uL (ref 3.87–5.11)
RDW: 13 % (ref 11.5–15.5)
WBC: 3.9 10*3/uL — ABNORMAL LOW (ref 4.0–10.5)

## 2019-04-09 LAB — TSH: TSH: 5.07 u[IU]/mL — ABNORMAL HIGH (ref 0.35–4.50)

## 2019-04-14 LAB — VITAMIN D 1,25 DIHYDROXY
Vitamin D 1, 25 (OH)2 Total: 49 pg/mL (ref 18–72)
Vitamin D2 1, 25 (OH)2: <8 pg/mL
Vitamin D3 1, 25 (OH)2: 49 pg/mL

## 2019-04-22 ENCOUNTER — Telehealth: Payer: PPO | Admitting: Nurse Practitioner

## 2019-04-22 DIAGNOSIS — M545 Low back pain, unspecified: Secondary | ICD-10-CM

## 2019-04-22 DIAGNOSIS — R300 Dysuria: Secondary | ICD-10-CM

## 2019-04-22 NOTE — Progress Notes (Signed)
Based on what you shared with me it looks like you have urinary problems with back pain,that should be evaluated in a face to face office visit. ecause you are experiencing back pain with this you need a facce to facc evisit for urinalysis and urine culture so that you receive proper treatment    NOTE: If you entered your credit card information for this eVisit, you will not be charged. You may see a "hold" on your card for the $35 but that hold will drop off and you will not have a charge processed.  If you are having a true medical emergency please call 911.     For an urgent face to face visit, [place] has four urgent care centers for your convenience:   . [facility]    [phone]                  Get Driving Directions  [id number] [place], [place] [address] . 10 am to 8 pm Monday-Friday . 12 pm to 8 pm Saturday-Sunday   . [facility] at [place]                  Get Driving Directions  [phone] [address], [place], [place] [address] . 8 am to 8 pm Monday-Friday . 9 am to 6 pm Saturday . 11 am to 6 pm Sunday   . [facility] at [place]                  Get Driving Directions   [address] [place], [place] [address] . 8 am to 8 pm Monday-Friday . 8 am to 4 pm Saturday-Sunday    . [facility] at [place]                    Get Driving Directions  [id number]  [address] [place], [place] [address]  . Monday-Friday, 12 PM to 6 PM    Your e-visit answers were reviewed by a board certified advanced clinical practitioner to complete your personal care plan.  Thank you for using e-Visits.

## 2019-05-13 ENCOUNTER — Encounter: Payer: Self-pay | Admitting: Family Medicine

## 2019-05-20 ENCOUNTER — Other Ambulatory Visit: Payer: Self-pay

## 2019-05-20 DIAGNOSIS — E039 Hypothyroidism, unspecified: Secondary | ICD-10-CM

## 2019-05-21 ENCOUNTER — Other Ambulatory Visit (INDEPENDENT_AMBULATORY_CARE_PROVIDER_SITE_OTHER): Payer: PPO

## 2019-05-21 DIAGNOSIS — E039 Hypothyroidism, unspecified: Secondary | ICD-10-CM

## 2019-05-21 LAB — T4, FREE: Free T4: 0.8 ng/dL (ref 0.60–1.60)

## 2019-05-21 LAB — T3: T3, Total: 133 ng/dL (ref 76–181)

## 2019-05-21 LAB — TSH: TSH: 6.48 u[IU]/mL — ABNORMAL HIGH (ref 0.35–4.50)

## 2019-05-21 NOTE — Addendum Note (Signed)
Addended by: Lynnea Ferrier on: 05/21/2019 09:33 AM   Modules accepted: Orders

## 2019-05-22 ENCOUNTER — Encounter: Payer: Self-pay | Admitting: Family Medicine

## 2019-05-22 NOTE — Telephone Encounter (Signed)
Jasmine,  This is not my patient.  Patient sent this to me in error. Please forward to the ordering provider.   Thank you, Dr. [name]

## 2019-05-24 ENCOUNTER — Ambulatory Visit: Payer: PPO | Attending: Internal Medicine

## 2019-05-24 DIAGNOSIS — Z23 Encounter for immunization: Secondary | ICD-10-CM | POA: Insufficient documentation

## 2019-05-24 NOTE — Progress Notes (Signed)
   Covid-19 Vaccination Clinic  Name:  Kristen Stephenson    MRN: [id number] DOB: [birth year]  05/24/2019  [name] was observed post Covid-19 immunization for 15 minutes without incidence. She was provided with Vaccine Information Sheet and instruction to access the V-Safe system.   [name] was instructed to call 911 with any severe reactions post vaccine: Marland Kitchen Difficulty breathing  . Swelling of your face and throat  . A fast heartbeat  . A bad rash all over your body  . Dizziness and weakness    Immunizations Administered    Name Date Dose VIS Date Route   Pfizer COVID-19 Vaccine 05/24/2019 10:38 AM 0.3 mL 03/20/2019 Intramuscular   Manufacturer: [place]   Lot: [id number]   [place]: [id number]

## 2019-06-15 ENCOUNTER — Encounter: Payer: Self-pay | Admitting: Family Medicine

## 2019-06-15 ENCOUNTER — Ambulatory Visit (INDEPENDENT_AMBULATORY_CARE_PROVIDER_SITE_OTHER): Payer: PPO | Admitting: Family Medicine

## 2019-06-15 ENCOUNTER — Other Ambulatory Visit: Payer: Self-pay

## 2019-06-15 VITALS — BP 124/88 | HR 84 | Temp 98.0°F | Ht 64.0 in | Wt 148.0 lb

## 2019-06-15 DIAGNOSIS — N3000 Acute cystitis without hematuria: Secondary | ICD-10-CM

## 2019-06-15 DIAGNOSIS — E2839 Other primary ovarian failure: Secondary | ICD-10-CM

## 2019-06-15 DIAGNOSIS — Z1231 Encounter for screening mammogram for malignant neoplasm of breast: Secondary | ICD-10-CM | POA: Diagnosis not present

## 2019-06-15 DIAGNOSIS — E039 Hypothyroidism, unspecified: Secondary | ICD-10-CM | POA: Diagnosis not present

## 2019-06-15 DIAGNOSIS — E038 Other specified hypothyroidism: Secondary | ICD-10-CM

## 2019-06-15 DIAGNOSIS — R3989 Other symptoms and signs involving the genitourinary system: Secondary | ICD-10-CM | POA: Diagnosis not present

## 2019-06-15 HISTORY — DX: Other specified hypothyroidism: E03.8

## 2019-06-15 LAB — POCT URINALYSIS DIPSTICK
Bilirubin, UA: NEGATIVE
Blood, UA: POSITIVE
Glucose, UA: NEGATIVE
Ketones, UA: NEGATIVE
Nitrite, UA: NEGATIVE
Protein, UA: NEGATIVE
Spec Grav, UA: 1.01 (ref 1.010–1.025)
Urobilinogen, UA: 0.2 E.U./dL
pH, UA: 6 (ref 5.0–8.0)

## 2019-06-15 MED ORDER — SULFAMETHOXAZOLE-TRIMETHOPRIM 800-160 MG PO TABS: 1.0000 | ORAL_TABLET | Freq: Two times a day (BID) | ORAL | 0 refills | Status: DC

## 2019-06-15 NOTE — Patient Instructions (Addendum)
Please follow up as scheduled for your next visit with me: 06/22/2019   I have sent in an antibiotic for you to take. This should help.   If you have any questions or concerns, please don't hesitate to send me a message via MyChart or call the office at [phone]. Thank you for visiting with [place] today! It's our pleasure caring for you. I have ordered a mammogram and/or bone density for you as we discussed today: [x]   Mammogram  [x]   Bone Density  Please call the office checked below to schedule your appointment: Your appointment will at the following location  [x]   [facility]      [place], [place]         []   [facility]  [place], [place]  Make sure to wear two peace clothing  No lotions powders or deodorants the day of the appointment Make sure to bring picture ID and insurance card.  Bring list of medications you are currently taking including any supplements.    Urinary Tract Infection, Adult  A urinary tract infection (UTI) is an infection of any part of the urinary tract. The urinary tract includes the kidneys, ureters, bladder, and urethra. These organs make, store, and get rid of urine in the body. Your health care provider may use other names to describe the infection. An upper UTI affects the ureters and kidneys (pyelonephritis). A lower UTI affects the bladder (cystitis) and urethra (urethritis). What are the causes? Most urinary tract infections are caused by bacteria in your genital area, around the entrance to your urinary tract (urethra). These bacteria grow and cause inflammation of your urinary tract. What increases the risk? You are more likely to develop this condition if:  You have a urinary catheter that stays in place (indwelling).  You are not able to control when you urinate or have a bowel movement (you have incontinence).  You are female  and you: ? Use a spermicide or diaphragm for birth control. ? Have low estrogen levels. ? Are pregnant.  You have certain genes that increase your risk (genetics).  You are sexually active.  You take antibiotic medicines.  You have a condition that causes your flow of urine to slow down, such as: ? An enlarged prostate, if you are female. ? Blockage in your urethra (stricture). ? A kidney stone. ? A nerve condition that affects your bladder control (neurogenic bladder). ? Not getting enough to drink, or not urinating often.  You have certain medical conditions, such as: ? Diabetes. ? A weak disease-fighting system (immunesystem). ? Sickle cell disease. ? Gout. ? Spinal cord injury. What are the signs or symptoms? Symptoms of this condition include:  Needing to urinate right away (urgently).  Frequent urination or passing small amounts of urine frequently.  Pain or burning with urination.  Blood in the urine.  Urine that smells bad or unusual.  Trouble urinating.  Cloudy urine.  Vaginal discharge, if you are female.  Pain in the abdomen or the lower back. You may also have:  Vomiting or a decreased appetite.  Confusion.  Irritability or tiredness.  A fever.  Diarrhea. The first symptom in older adults may be confusion. In some cases, they may not have any symptoms until the infection has worsened. How is this diagnosed? This condition is diagnosed based  on your medical history and a physical exam. You may also have other tests, including:  Urine tests.  Blood tests.  Tests for sexually transmitted infections (STIs). If you have had more than one UTI, a cystoscopy or imaging studies may be done to determine the cause of the infections. How is this treated? Treatment for this condition includes:  Antibiotic medicine.  Over-the-counter medicines to treat discomfort.  Drinking enough water to stay hydrated. If you have frequent infections or have  other conditions such as a kidney stone, you may need to see a health care provider who specializes in the urinary tract (urologist). In rare cases, urinary tract infections can cause sepsis. Sepsis is a life-threatening condition that occurs when the body responds to an infection. Sepsis is treated in the hospital with IV antibiotics, fluids, and other medicines. Follow these instructions at home:  Medicines  Take over-the-counter and prescription medicines only as told by your health care provider.  If you were prescribed an antibiotic medicine, take it as told by your health care provider. Do not stop using the antibiotic even if you start to feel better. General instructions  Make sure you: ? Empty your bladder often and completely. Do not hold urine for long periods of time. ? Empty your bladder after sex. ? Wipe from front to back after a bowel movement if you are female. Use each tissue one time when you wipe.  Drink enough fluid to keep your urine pale yellow.  Keep all follow-up visits as told by your health care provider. This is important. Contact a health care provider if:  Your symptoms do not get better after 1-2 days.  Your symptoms go away and then return. Get help right away if you have:  Severe pain in your back or your lower abdomen.  A fever.  Nausea or vomiting. Summary  A urinary tract infection (UTI) is an infection of any part of the urinary tract, which includes the kidneys, ureters, bladder, and urethra.  Most urinary tract infections are caused by bacteria in your genital area, around the entrance to your urinary tract (urethra).  Treatment for this condition often includes antibiotic medicines.  If you were prescribed an antibiotic medicine, take it as told by your health care provider. Do not stop using the antibiotic even if you start to feel better.  Keep all follow-up visits as told by your health care provider. This is important. This  information is not intended to replace advice given to you by your health care provider. Make sure you discuss any questions you have with your health care provider. Document Revised: 03/13/2018 Document Reviewed: 10/03/2017 Elsevier Patient Education  2020 [employer].

## 2019-06-15 NOTE — Progress Notes (Signed)
Subjective  Same day acute visit; PCP not available. New pt to me. Chart reviewed.   CC:  Chief Complaint  Patient presents with  . Urinary Tract Infection    had uti in January and came back again last Thursday.  a lot of pressure    HPI: Kristen Stephenson is a 69 y.o. female who presents to the office today to address the problems listed above in the chief complaint.  Patient reports 4 day h/o of persistent urinary frequency.  She has sensation of increased urinary pressure.  She denies fevers flank pain nausea vomiting or gross hematuria.  Symptoms have been present for several hours to days.  She denies history of interstitial cystitis.  She denies vaginal symptoms including vaginal discharge or pelvic pain.   Reviewed tsh and tfts.  CPE: due mammo and dexa  Has TOC appt with me soon.   Assessment  1. Acute cystitis without hematuria   2. Subclinical hypothyroidism   3. Encounter for screening mammogram for breast cancer   4. Hypoestrogenism      Plan   UTI: await culture and take septra x 5days. Supportive care.   Discussed thyroid results and reassured  Ordered mammo and dexa.   rec f/u with me in June or July for toc and repeat TFTs. Last cpe was in December.   Follow up: June or July for toc  Orders Placed This Encounter  Procedures  . Urine Culture  . DG Bone Density  . MM DIGITAL SCREENING BILATERAL  . POCT Urinalysis Dipstick   Meds ordered this encounter  Medications  . sulfamethoxazole-trimethoprim (BACTRIM DS) 800-160 MG tablet    Sig: Take 1 tablet by mouth 2 (two) times daily.    Dispense:  10 tablet    Refill:  0      I reviewed the patients updated PMH, FH, and SocHx.    Patient Active Problem List   Diagnosis Date Noted  . Subclinical hypothyroidism 06/15/2019  . 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 Anxiety 09/16/2018  . History of ovarian cancer 09/16/2018  . HLD (hyperlipidemia) 09/16/2018  . GERD  (gastroesophageal reflux disease) 09/16/2018   Current Meds  Medication Sig  . aspirin EC 81 MG tablet Take 81 mg by mouth daily.  . cholecalciferol (VITAMIN D3) 25 MCG (1000 UNIT) tablet Take 1,000 Units by mouth daily.  . simvastatin (ZOCOR) 20 MG tablet Take 20 mg by mouth at bedtime.    Review of Systems: Cardiovascular: negative for chest pain Respiratory: negative for SOB or persistent cough Gastrointestinal: negative for abdominal pain Constitutional: Negative for fever malaise or anorexia  Objective  Vitals: BP 124/88 (BP Location: Left Arm, Patient Position: Sitting, Cuff Size: Normal)   Pulse 84   Temp 98 F (36.7 C) (Temporal)   Ht 5\' 4"  (1.626 m)   Wt 148 lb (67.1 kg)   SpO2 98%   BMI 25.40 kg/m  General: no acute distress  Psych:  Alert and oriented, normal mood and affect Gastrointestinal: soft, flat abdomen, normal active bowel sounds, no palpable masses, no hepatosplenomegaly, no appreciated hernias, NO CVAT, mild suprapubic ttp w/o rebound or guarding Skin:  Warm, no rashes Neurologic:   Mental status is normal. normal gait Office Visit on 06/15/2019  Component Date Value Ref Range Status  . Color, UA 06/15/2019 Yellow   Final  . Clarity, UA 06/15/2019 Cloudy   Final  . Glucose, UA 06/15/2019 Negative  Negative Final  . Bilirubin, UA  06/15/2019 Negative   Final  . Ketones, UA 06/15/2019 Negative   Final  . Spec Grav, UA 06/15/2019 1.010  1.010 - 1.025 Final  . Blood, UA 06/15/2019 Positive   Final  . pH, UA 06/15/2019 6.0  5.0 - 8.0 Final  . Protein, UA 06/15/2019 Negative  Negative Final  . Urobilinogen, UA 06/15/2019 0.2  0.2 or 1.0 E.U./dL Final  . Nitrite, UA 06/15/2019 Negative   Final  . Leukocytes, UA 06/15/2019 Large (3+)* Negative Final    Commons side effects, risks, benefits, and alternatives for medications and treatment plan prescribed today were discussed, and the patient expressed understanding of the given instructions. Patient is  instructed to call or message via MyChart if he/she has any questions or concerns regarding our treatment plan. No barriers to understanding were identified. We discussed Red Flag symptoms and signs in detail. Patient expressed understanding regarding what to do in case of urgent or emergency type symptoms.   Medication list was reconciled, printed and provided to the patient in AVS. Patient instructions and summary information was reviewed with the patient as documented in the AVS. This note was prepared with assistance of Dragon voice recognition software. Occasional wrong-word or sound-a-like substitutions may have occurred due to the inherent limitations of voice recognition software

## 2019-06-16 ENCOUNTER — Ambulatory Visit: Payer: PPO | Attending: Internal Medicine

## 2019-06-16 DIAGNOSIS — Z23 Encounter for immunization: Secondary | ICD-10-CM | POA: Insufficient documentation

## 2019-06-16 LAB — URINE CULTURE
MICRO NUMBER:: 10226060
SPECIMEN QUALITY:: ADEQUATE

## 2019-06-16 NOTE — Progress Notes (Signed)
   Covid-19 Vaccination Clinic  Name:  Kristen Stephenson    MRN: [id number] DOB: 1950/07/21  06/16/2019  [name] was observed post Covid-19 immunization for 15 minutes without incident. She was provided with Vaccine Information Sheet and instruction to access the V-Safe system.   [name] was instructed to call 911 with any severe reactions post vaccine: Marland Kitchen Difficulty breathing  . Swelling of face and throat  . A fast heartbeat  . A bad rash all over body  . Dizziness and weakness   Immunizations Administered    Name Date Dose VIS Date Route   Pfizer COVID-19 Vaccine 06/16/2019 12:39 PM 0.3 mL 03/20/2019 Intramuscular   Manufacturer: [place]   Lot: [id number]   [place]: [id number]

## 2019-06-22 ENCOUNTER — Encounter: Payer: PPO | Admitting: Family Medicine

## 2019-07-28 ENCOUNTER — Encounter: Payer: Self-pay | Admitting: Family Medicine

## 2019-07-29 ENCOUNTER — Other Ambulatory Visit: Payer: Self-pay

## 2019-07-29 MED ORDER — SIMVASTATIN 20 MG PO TABS: 20.0000 mg | ORAL_TABLET | Freq: Every day | ORAL | 0 refills | Status: DC

## 2019-09-01 ENCOUNTER — Encounter: Payer: Self-pay | Admitting: Family Medicine

## 2019-09-01 ENCOUNTER — Ambulatory Visit
Admission: RE | Admit: 2019-09-01 | Discharge: 2019-09-01 | Disposition: A | Discharge status: Home / Self Care | Payer: PPO | Source: Ambulatory Visit | Attending: Family Medicine | Admitting: Family Medicine

## 2019-09-01 ENCOUNTER — Other Ambulatory Visit: Payer: Self-pay

## 2019-09-01 DIAGNOSIS — M8589 Other specified disorders of bone density and structure, multiple sites: Secondary | ICD-10-CM | POA: Diagnosis not present

## 2019-09-01 DIAGNOSIS — Z780 Asymptomatic menopausal state: Secondary | ICD-10-CM | POA: Diagnosis not present

## 2019-09-01 DIAGNOSIS — Z1231 Encounter for screening mammogram for malignant neoplasm of breast: Secondary | ICD-10-CM

## 2019-09-01 DIAGNOSIS — E2839 Other primary ovarian failure: Secondary | ICD-10-CM

## 2019-09-01 DIAGNOSIS — M8580 Other specified disorders of bone density and structure, unspecified site: Secondary | ICD-10-CM

## 2019-09-01 HISTORY — DX: Other specified disorders of bone density and structure, unspecified site: M85.80

## 2019-09-01 HISTORY — DX: Asymptomatic menopausal state: Z78.0

## 2019-09-02 ENCOUNTER — Encounter: Payer: Self-pay | Admitting: Family Medicine

## 2019-09-02 ENCOUNTER — Encounter: Payer: PPO | Admitting: Family Medicine

## 2019-09-20 ENCOUNTER — Other Ambulatory Visit: Payer: Self-pay | Admitting: Family Medicine

## 2019-09-23 ENCOUNTER — Encounter: Payer: Self-pay | Admitting: Family Medicine

## 2019-09-23 ENCOUNTER — Other Ambulatory Visit: Payer: Self-pay

## 2019-09-23 ENCOUNTER — Ambulatory Visit (INDEPENDENT_AMBULATORY_CARE_PROVIDER_SITE_OTHER): Payer: PPO | Admitting: Family Medicine

## 2019-09-23 VITALS — BP 122/64 | HR 64 | Temp 98.6°F | Ht 64.0 in | Wt 140.0 lb

## 2019-09-23 DIAGNOSIS — R918 Other nonspecific abnormal finding of lung field: Secondary | ICD-10-CM | POA: Diagnosis not present

## 2019-09-23 DIAGNOSIS — D373 Neoplasm of uncertain behavior of appendix: Secondary | ICD-10-CM | POA: Diagnosis not present

## 2019-09-23 DIAGNOSIS — Z8543 Personal history of malignant neoplasm of ovary: Secondary | ICD-10-CM | POA: Diagnosis not present

## 2019-09-23 DIAGNOSIS — E039 Hypothyroidism, unspecified: Secondary | ICD-10-CM

## 2019-09-23 DIAGNOSIS — M8580 Other specified disorders of bone density and structure, unspecified site: Secondary | ICD-10-CM | POA: Diagnosis not present

## 2019-09-23 DIAGNOSIS — E782 Mixed hyperlipidemia: Secondary | ICD-10-CM

## 2019-09-23 DIAGNOSIS — Z780 Asymptomatic menopausal state: Secondary | ICD-10-CM

## 2019-09-23 DIAGNOSIS — H9113 Presbycusis, bilateral: Secondary | ICD-10-CM | POA: Diagnosis not present

## 2019-09-23 DIAGNOSIS — E038 Other specified hypothyroidism: Secondary | ICD-10-CM

## 2019-09-23 MED ORDER — SHINGRIX 50 MCG/0.5ML IM SUSR
0.5000 mL | Freq: Once | INTRAMUSCULAR | 0 refills | Status: AC
Start: 2019-09-23 — End: 2019-09-23

## 2019-09-23 NOTE — Patient Instructions (Addendum)
Please return in 3-4 months for your annual complete physical; please come fasting.  Please take the prescription for Shingrix to the pharmacy so they may administer the vaccinations. Your insurance will then cover the injections.   You can see an audiologist to discuss your hearing loss further.   If you have any questions or concerns, please don't hesitate to send me a message via MyChart or call the office at [phone]. Thank you for visiting with [place] today! It's our pleasure caring for you.   Calcium Intake Recommendations You can take Caltrate Plus twice a day or get it through your diet or other OTC supplements (Viactiv, OsCal etc)  Calcium is a mineral that affects many functions in the body, including:  Blood clotting.  Blood vessel function.  Nerve impulse conduction.  Hormone secretion.  Muscle contraction.  Bone and teeth functions.  Most of your body's calcium supply is stored in your bones and teeth. When your calcium stores are low, you may be at risk for low bone mass, bone loss, and bone fractures. Consuming enough calcium helps to grow healthy bones and teeth and to prevent breakdown over time. It is very important that you get enough calcium if you are:  A child undergoing rapid growth.  An adolescent girl.  A pre- or post-menopausal woman.  A woman whose menstrual cycle has stopped due to anorexia nervosa or regular intense exercise.  An individual with lactose intolerance or a milk allergy.  A vegetarian.  What is my plan? Try to consume the recommended amount of calcium daily based on your age. Depending on your overall health, your health care provider may recommend increased calcium intake.General daily calcium intake recommendations by age are:  Birth to 6 months: 200 mg.  Infants 7 to 12 months: 260 mg.  Children 1 to 3 years: 700 mg.  Children 4 to 8 years: 1,000 mg.  Children 9 to 13 years: 1,300 mg.  Teens 14 to 18 years: 1,300  mg.  Adults 19 to 50 years: 1,000 mg.  Adult women 51 to 70 years: 1,200 mg.  Adult men 51 to 70 years: 1,000 mg.  Adults 71 years and older: 1,200 mg.  Pregnant and breastfeeding teens: 1,300 mg.  Pregnant and breastfeeding adults: 1,000 mg.  What do I need to know about calcium intake?  In order for the body to absorb calcium, it needs vitamin D. You can get vitamin D through (we recommend getting [id number] units of Vitamin D daily) ? Direct exposure of the skin to sunlight. ? Foods, such as egg yolks, liver, saltwater fish, and fortified milk. ? Supplements.  Consuming too much calcium may cause: ? Constipation. ? Decreased absorption of iron and zinc. ? Kidney stones.  Calcium supplements may interact with certain medicines. Check with your health care provider before starting any calcium supplements.  Try to get most of your calcium from food. What foods can I eat? Grains  Fortified oatmeal. Fortified ready-to-eat cereals. Fortified frozen waffles. Vegetables Turnip greens. Broccoli. Fruits Fortified orange juice. Meats and Other Protein Sources Canned sardines with bones. Canned salmon with bones. Soy beans. Tofu. Baked beans. Almonds. [place] nuts. Sunflower seeds. Dairy Milk. Yogurt. Cheese. Cottage cheese. Beverages Fortified soy milk. Fortified rice milk. Sweets/Desserts Pudding. Ice Cream. Milkshakes. Blackstrap molasses. The items listed above may not be a complete list of recommended foods or beverages. Contact your dietitian for more options. What foods can affect my calcium intake? It may be more difficult for your body  to use calcium or calcium may leave your body more quickly if you consume large amounts of:  Sodium.  Protein.  Caffeine.  Alcohol.  This information is not intended to replace advice given to you by your health care provider. Make sure you discuss any questions you have with your health care provider. Document Released:  11/08/2003 Document Revised: 10/14/2015 Document Reviewed: 09/01/2013 Elsevier Interactive Patient Education  2018 [employer].

## 2019-09-23 NOTE — Progress Notes (Signed)
Subjective  CC:  Chief Complaint  Patient presents with  . Establish Care  . Cerumen Impaction    both ears     HPI: Kristen Stephenson is a 69 y.o. female who presents to [place] at [facility] today to establish care with me as a new patient.   She has the following concerns or needs:  Very pleasant 69 yo with HLD, h/o ovarian and appendiceal cancers, subclinical hypothyroidism and osteopenia here to establish care. Doing very well overall. I reviewed chart in detail.   Labs from feb show subclinical hypothyroidism.   Reviewed dexa results with pt: stable osteopenia. On cal and D supplements. Active.   HLD on statin. No myalgias  Cancer: has f/u with onc. Annual surveillance ct, next in august  Pulmonary nodules: f/u ct in august. May warrant dedicated chest ct pending results.   C/o hearin difficulties: can't get any wax out. No pain.   Assessment  1. Mixed hyperlipidemia   2. Low grade mucinous neoplasm of appendix   3. History of ovarian cancer   4. Osteopenia after menopause   5. Subclinical hypothyroidism   6. Pulmonary nodules   7. Presbycusis of both ears      Plan   HLD: will check fasting lipids at cpe visit. Continue statin  Cancers: has f/u with oncology. Has done well.   Osteopenia; reviewed recs  Recheck thyroid function at next visit. Clinically euthyroid  pulm nodules for ct soon  HOH. No wax. Refer to audiology.  HM: shingrix due.   Follow up:  Return in about 3 months (around 12/24/2019) for complete physical. No orders of the defined types were placed in this encounter.  Meds ordered this encounter  Medications  . Zoster Vaccine Adjuvanted [facility]) injection    Sig: Inject 0.5 mLs into the muscle once for 1 dose. Please give 2nd dose 2-6 months after first dose    Dispense:  2 each    Refill:  0     Depression screen [facility] 2/9 04/06/2019 09/16/2018  Decreased Interest 0 0  Down, Depressed, Hopeless 0 0  PHQ - 2  Score 0 0  Altered sleeping 1 -  Tired, decreased energy 0 -  Change in appetite 0 -  Feeling bad or failure about yourself  0 -  Trouble concentrating 0 -  Moving slowly or fidgety/restless 0 -  Suicidal thoughts 0 -  PHQ-9 Score 1 -    We updated and reviewed the patient's past history in detail and it is documented below.  Patient Active Problem List   Diagnosis Date Noted  . 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    Priority: High  . History of ovarian cancer 09/16/2018    Priority: High  . Mixed hyperlipidemia 09/16/2018    Priority: High  . Osteopenia after menopause 09/01/2019    Priority: Medium    dexa 08/2019   . Subclinical hypothyroidism 06/15/2019    Priority: Medium  . Anxiety 09/16/2018    Priority: Medium  . GERD (gastroesophageal reflux disease) 09/16/2018    Priority: Medium  . Pulmonary nodules 09/23/2019  . Presbycusis of both ears 09/23/2019   Health Maintenance  Topic Date Due  . INFLUENZA VACCINE  11/08/2019  . MAMMOGRAM  08/31/2020  . DEXA SCAN  09/01/2022  . COLONOSCOPY  10/08/2022  . TETANUS/TDAP  05/15/2026  . COVID-19 Vaccine  Completed  . Hepatitis C Screening  Completed  . PNA vac Low Risk Adult  Completed   Immunization History  Administered Date(s) Administered  . Influenza-Unspecified 04/28/2014, 05/09/2015, 05/15/2016, 05/20/2017, 05/22/2018, 11/21/2018  . PFIZER SARS-COV-2 Vaccination 05/24/2019, 06/16/2019  . Pneumococcal Conjugate-13 10/01/2017  . Pneumococcal Polysaccharide-23 11/21/2018  . Tdap 05/15/2016  . Zoster 04/28/2014   Current Meds  Medication Sig  . aspirin EC 81 MG tablet Take 81 mg by mouth daily.  . cholecalciferol (VITAMIN D3) 25 MCG (1000 UNIT) tablet Take 1,000 Units by mouth daily.  . simvastatin (ZOCOR) 20 MG tablet TAKE 1 TABLET BY MOUTH EVERYDAY AT BEDTIME    Allergies: Patient has No Known Allergies. Past Medical History Patient  has a past medical history of Adjustment disorder with  anxiety, Anxiety, Barrett's esophagus, H/O ovarian cancer, History of malignant neoplasm of appendix, Hyperlipidemia, Hyperlipidemia, Lung nodule, Osteopenia, Osteopenia after menopause (09/01/2019), and Subclinical hypothyroidism (06/15/2019). Past Surgical History Patient  has a past surgical history that includes Abdominal hysterectomy; Appendectomy (2018); Arthroscopic repair ACL (Left); Femur fracture surgery (Right); Cyst excision (Right, 1972); Tonsillectomy; Ankle surgery (2003); Vulvar lesion removal (07/2007); Inguinal hernia repair (11/2007); and Breast excisional biopsy. Family History: Patient family history includes Asthma in her sister; CAD in her mother; Diabetes in her father and maternal grandmother; Osteoporosis in her mother; Rheum arthritis in her sister. Social History:  Patient  reports that she quit smoking about 28 years ago. Her smoking use included cigarettes. She has never used smokeless tobacco. She reports current alcohol use. She reports that she does not use drugs.  Review of Systems: Constitutional: negative for fever or malaise Ophthalmic: negative for photophobia, double vision or loss of vision Cardiovascular: negative for chest pain, dyspnea on exertion, or new LE swelling Respiratory: negative for SOB or persistent cough Gastrointestinal: negative for abdominal pain, change in bowel habits or melena Genitourinary: negative for dysuria or gross hematuria Musculoskeletal: negative for new gait disturbance or muscular weakness Integumentary: negative for new or persistent rashes Neurological: negative for TIA or stroke symptoms Psychiatric: negative for SI or delusions Allergic/Immunologic: negative for hives  Patient Care Team    Relationship Specialty Notifications Start End  [name], MD PCP - General Family Medicine  09/23/19   Tish Men, MD Consulting Physician Hematology  04/05/19     Objective  Vitals: BP 122/64   Pulse 64   Temp 98.6 F (37  C) (Temporal)   Ht 5\' 4"  (1.626 m)   Wt 140 lb (63.5 kg)   SpO2 97%   BMI 24.03 kg/m  General:  Well developed, well nourished, no acute distress  Psych:  Alert and oriented,normal mood and affect HEENT:  Normocephalic, atraumatic, non-icteric sclera, supple neck without adenopathy, mass or thyromegaly Cardiovascular:  RRR without gallop, rub or murmur Respiratory:  Good breath sounds bilaterally, CTAB with normal respiratory effort   Hearing Screening   125Hz  250Hz  500Hz  1000Hz  2000Hz  3000Hz  4000Hz  6000Hz  8000Hz   Right ear:           Left ear:           Comments: Fail both ears     Commons side effects, risks, benefits, and alternatives for medications and treatment plan prescribed today were discussed, and the patient expressed understanding of the given instructions. Patient is instructed to call or message via MyChart if he/she has any questions or concerns regarding our treatment plan. No barriers to understanding were identified. We discussed Red Flag symptoms and signs in detail. Patient expressed understanding regarding what to do in case of urgent or emergency type symptoms.  Medication list was reconciled, printed and provided to the patient in AVS. Patient instructions and summary information was reviewed with the patient as documented in the AVS. This note was prepared with assistance of Dragon voice recognition software. Occasional wrong-word or sound-a-like substitutions may have occurred due to the inherent limitations of voice recognition software  This visit occurred during the SARS-CoV-2 public health emergency.  Safety protocols were in place, including screening questions prior to the visit, additional usage of staff PPE, and extensive cleaning of exam room while observing appropriate contact time as indicated for disinfecting solutions.

## 2019-09-29 ENCOUNTER — Other Ambulatory Visit: Payer: Self-pay

## 2019-09-29 ENCOUNTER — Encounter: Payer: Self-pay | Admitting: Family Medicine

## 2019-09-29 MED ORDER — SIMVASTATIN 20 MG PO TABS: ORAL_TABLET | ORAL | 0 refills | Status: DC

## 2019-10-13 ENCOUNTER — Encounter: Payer: Self-pay | Admitting: Family Medicine

## 2019-10-14 ENCOUNTER — Ambulatory Visit (INDEPENDENT_AMBULATORY_CARE_PROVIDER_SITE_OTHER): Payer: PPO | Admitting: Family Medicine

## 2019-10-14 ENCOUNTER — Encounter: Payer: Self-pay | Admitting: Family Medicine

## 2019-10-14 ENCOUNTER — Other Ambulatory Visit: Payer: Self-pay

## 2019-10-14 VITALS — BP 112/70 | HR 86 | Temp 97.8°F | Resp 18 | Ht 64.0 in | Wt 139.4 lb

## 2019-10-14 DIAGNOSIS — R350 Frequency of micturition: Secondary | ICD-10-CM

## 2019-10-14 LAB — POCT URINALYSIS DIPSTICK
Glucose, UA: NEGATIVE
Ketones, UA: NEGATIVE
Nitrite, UA: NEGATIVE
Protein, UA: POSITIVE — AB
Spec Grav, UA: >=1.03 — AB (ref 1.010–1.025)
Urobilinogen, UA: 1 E.U./dL
pH, UA: 5.5 (ref 5.0–8.0)

## 2019-10-14 LAB — URINALYSIS, MICROSCOPIC ONLY

## 2019-10-14 MED ORDER — NITROFURANTOIN MONOHYD MACRO 100 MG PO CAPS: 100.0000 mg | ORAL_CAPSULE | Freq: Two times a day (BID) | ORAL | 0 refills | Status: DC

## 2019-10-14 NOTE — Progress Notes (Signed)
Subjective   CC:  Chief Complaint  Patient presents with  . Urinary Tract Infection    Since Friday she has been having some urinary frequency, urgency and some back pain. She stated that there is no pain with urination.     HPI: Kristen Stephenson is a 69 y.o. female who presents to the office today to address the problems listed above in the chief complaint.  Patient reports 3 to 4 days of urinary frequency.  She has sensation of increased urinary pressure.  She denies fevers flank pain nausea vomiting or gross hematuria.  She has no dysuria.  She denies history of interstitial cystitis.  She denies vaginal symptoms including vaginal discharge or pelvic pain.  She was recently sexually active.  The symptoms started abruptly and are similar to what she experienced back in March, February and January.  In March her urine culture was negative but the antibiotics did not resolve her symptoms within 24 hours.  She denies chronic overactive symptoms she does have urgency as well.  Her urine can be dark in color as well.  No prior history of recurrent UTIs.  No constipation.  Assessment  1. Urinary frequency      Plan   Urinary frequency: Check urine microscopy and urine culture.  Treat with Macrobid and follow-up on culture.  If negative for infection, refer to neurology for further evaluation due to recurrent symptoms.  Patient agrees.  Follow up: No follow-ups on file.  Orders Placed This Encounter  Procedures  . Urine Culture  . Urine Microscopic Only  . POCT Urinalysis Dipstick   Meds ordered this encounter  Medications  . nitrofurantoin, macrocrystal-monohydrate, (MACROBID) 100 MG capsule    Sig: Take 1 capsule (100 mg total) by mouth 2 (two) times daily.    Dispense:  10 capsule    Refill:  0      I reviewed the patients updated PMH, FH, and SocHx.    Patient Active Problem List   Diagnosis Date Noted  . 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    Priority:  High  . History of ovarian cancer 09/16/2018    Priority: High  . Mixed hyperlipidemia 09/16/2018    Priority: High  . Osteopenia after menopause 09/01/2019    Priority: Medium  . Subclinical hypothyroidism 06/15/2019    Priority: Medium  . Anxiety 09/16/2018    Priority: Medium  . GERD (gastroesophageal reflux disease) 09/16/2018    Priority: Medium  . Pulmonary nodules 09/23/2019  . Presbycusis of both ears 09/23/2019   Current Meds  Medication Sig  . aspirin EC 81 MG tablet Take 81 mg by mouth daily.  . cholecalciferol (VITAMIN D3) 25 MCG (1000 UNIT) tablet Take 1,000 Units by mouth daily.  . simvastatin (ZOCOR) 20 MG tablet TAKE 1 TABLET BY MOUTH EVERYDAY AT BEDTIME    Review of Systems: Cardiovascular: negative for chest pain Respiratory: negative for SOB or persistent cough Gastrointestinal: negative for abdominal pain Constitutional: Negative for fever malaise or anorexia  Objective  Vitals: BP 112/70   Pulse 86   Temp 97.8 F (36.6 C) (Temporal)   Resp 18   Ht 5\' 4"  (1.626 m)   Wt 139 lb 6.4 oz (63.2 kg)   SpO2 97%   BMI 23.93 kg/m  General: no acute distress  Psych:  Alert and oriented, normal mood and affect Cardiovascular:  RRR without murmur or gallop. no peripheral edema Respiratory:  Good breath sounds bilaterally, CTAB with normal respiratory  effort Gastrointestinal: soft, flat abdomen, normal active bowel sounds, no palpable masses, no hepatosplenomegaly, no appreciated hernias, NO CVAT, mild suprapubic ttp w/o rebound or guarding Skin:  Warm, no rashes Neurologic:   Mental status is normal. normal gait Office Visit on 10/14/2019  Component Date Value Ref Range Status  . Color, UA 10/14/2019 yellow   Final  . Clarity, UA 10/14/2019 clear   Final  . Glucose, UA 10/14/2019 Negative  Negative Final  . Bilirubin, UA 10/14/2019 1+   Final  . Ketones, UA 10/14/2019 negative   Final  . Spec Grav, UA 10/14/2019 >=1.030* 1.010 - 1.025 Final  . Blood, UA  10/14/2019 1+   Final  . pH, UA 10/14/2019 5.5  5.0 - 8.0 Final  . Protein, UA 10/14/2019 Positive* Negative Final  . Urobilinogen, UA 10/14/2019 1.0  0.2 or 1.0 E.U./dL Final  . Nitrite, UA 10/14/2019 negative   Final  . Leukocytes, UA 10/14/2019 Small (1+)* Negative Final    Commons side effects, risks, benefits, and alternatives for medications and treatment plan prescribed today were discussed, and the patient expressed understanding of the given instructions. Patient is instructed to call or message via MyChart if he/she has any questions or concerns regarding our treatment plan. No barriers to understanding were identified. We discussed Red Flag symptoms and signs in detail. Patient expressed understanding regarding what to do in case of urgent or emergency type symptoms.   Medication list was reconciled, printed and provided to the patient in AVS. Patient instructions and summary information was reviewed with the patient as documented in the AVS. This note was prepared with assistance of Dragon voice recognition software. Occasional wrong-word or sound-a-like substitutions may have occurred due to the inherent limitations of voice recognition software

## 2019-10-15 ENCOUNTER — Ambulatory Visit: Payer: PPO | Admitting: Family Medicine

## 2019-10-16 LAB — URINE CULTURE
MICRO NUMBER:: 10675709
SPECIMEN QUALITY:: ADEQUATE

## 2019-12-07 ENCOUNTER — Other Ambulatory Visit: Payer: Self-pay

## 2019-12-10 ENCOUNTER — Ambulatory Visit (HOSPITAL_BASED_OUTPATIENT_CLINIC_OR_DEPARTMENT_OTHER)
Admission: RE | Admit: 2019-12-10 | Discharge: 2019-12-10 | Disposition: A | Discharge status: Home / Self Care | Payer: PPO | Source: Ambulatory Visit | Attending: Hematology | Admitting: Hematology

## 2019-12-10 ENCOUNTER — Inpatient Hospital Stay (HOSPITAL_BASED_OUTPATIENT_CLINIC_OR_DEPARTMENT_OTHER): Payer: PPO | Admitting: Hematology & Oncology

## 2019-12-10 ENCOUNTER — Other Ambulatory Visit: Payer: Self-pay

## 2019-12-10 ENCOUNTER — Inpatient Hospital Stay: Payer: PPO | Attending: Hematology & Oncology

## 2019-12-10 ENCOUNTER — Encounter: Payer: Self-pay | Admitting: Hematology & Oncology

## 2019-12-10 ENCOUNTER — Ambulatory Visit: Payer: PPO | Admitting: Hematology

## 2019-12-10 VITALS — BP 112/68 | HR 73 | Temp 98.3°F | Resp 19 | Wt 143.0 lb

## 2019-12-10 DIAGNOSIS — Z8509 Personal history of malignant neoplasm of other digestive organs: Secondary | ICD-10-CM | POA: Insufficient documentation

## 2019-12-10 DIAGNOSIS — D373 Neoplasm of uncertain behavior of appendix: Secondary | ICD-10-CM

## 2019-12-10 DIAGNOSIS — C181 Malignant neoplasm of appendix: Secondary | ICD-10-CM | POA: Diagnosis not present

## 2019-12-10 LAB — CMP (CANCER CENTER ONLY)
ALT: 10 U/L (ref 0–44)
AST: 17 U/L (ref 15–41)
Albumin: 4.3 g/dL (ref 3.5–5.0)
Alkaline Phosphatase: 55 U/L (ref 38–126)
Anion gap: 7 (ref 5–15)
BUN: 15 mg/dL (ref 8–23)
CO2: 31 mmol/L (ref 22–32)
Calcium: 9.9 mg/dL (ref 8.9–10.3)
Chloride: 104 mmol/L (ref 98–111)
Creatinine: 0.82 mg/dL (ref 0.44–1.00)
GFR, Est AFR Am: >60 mL/min (ref >60)
GFR, Estimated: >60 mL/min (ref >60)
Glucose, Bld: 90 mg/dL (ref 70–99)
Potassium: 4.4 mmol/L (ref 3.5–5.1)
Sodium: 142 mmol/L (ref 135–145)
Total Bilirubin: 0.6 mg/dL (ref 0.3–1.2)
Total Protein: 6.8 g/dL (ref 6.5–8.1)

## 2019-12-10 LAB — CBC WITH DIFFERENTIAL (CANCER CENTER ONLY)
Abs Immature Granulocytes: 0.01 10*3/uL (ref 0.00–0.07)
Basophils Absolute: 0 10*3/uL (ref 0.0–0.1)
Basophils Relative: 1 %
Eosinophils Absolute: 0.2 10*3/uL (ref 0.0–0.5)
Eosinophils Relative: 6 %
HCT: 42.6 % (ref 36.0–46.0)
Hemoglobin: 13.8 g/dL (ref 12.0–15.0)
Immature Granulocytes: 0 %
Lymphocytes Relative: 36 %
Lymphs Abs: 1.5 10*3/uL (ref 0.7–4.0)
MCH: 30.1 pg (ref 26.0–34.0)
MCHC: 32.4 g/dL (ref 30.0–36.0)
MCV: 92.8 fL (ref 80.0–100.0)
Monocytes Absolute: 0.5 10*3/uL (ref 0.1–1.0)
Monocytes Relative: 12 %
Neutro Abs: 2 10*3/uL (ref 1.7–7.7)
Neutrophils Relative %: 45 %
Platelet Count: 252 10*3/uL (ref 150–400)
RBC: 4.59 MIL/uL (ref 3.87–5.11)
RDW: 12.5 % (ref 11.5–15.5)
WBC Count: 4.3 10*3/uL (ref 4.0–10.5)
nRBC: 0 % (ref 0.0–0.2)

## 2019-12-10 MED ORDER — IOHEXOL 300 MG/ML  SOLN
100.0000 mL | Freq: Once | INTRAMUSCULAR | Status: AC | PRN
  Administered 2019-12-10: 100 mL via INTRAVENOUS

## 2019-12-10 NOTE — Progress Notes (Signed)
Hematology and Oncology Follow Up Visit  Kristen Stephenson [id number] 1951/02/16 69 y.o. 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  She is seen yearly.  She is originally from [place].  She moved down here to retire.  She is with her husband.  She has 2 sons.  She was followed by Dr. Maylon Peppers.  As always, he was very thorough with his follow-up.  She is doing okay.  She had a CT scan done today.  Unfortunately we do not have the results yet.  She has had occasional abdominal pain.  This is been in the right upper quadrant.  She has had no nausea or vomiting.  She had no change in bowel or bladder habits.  She has had no bleeding.  There is been no cough or shortness of breath.  She has had no leg swelling.  She has been quite active.  She just got back from the [employer].  She is nice and tan.  Overall, I would say her performance status is ECOG 0.  Medications:  Current Outpatient Medications:  .  aspirin EC 81 MG tablet, Take 81 mg by mouth daily., Disp: , Rfl:  .  cholecalciferol (VITAMIN D3) 25 MCG (1000 UNIT) tablet, Take 1,000 Units by mouth daily., Disp: , Rfl:  .  simvastatin (ZOCOR) 20 MG tablet, TAKE 1 TABLET BY MOUTH EVERYDAY AT BEDTIME, Disp: 60 tablet, Rfl: 0  Allergies: No Known Allergies  Past Medical History, Surgical history, Social history, and Family History were reviewed and updated.  Review of Systems: Review of Systems  Constitutional: Negative.   HENT:  Negative.   Eyes: Negative.   Respiratory: Negative.   Cardiovascular: Negative.   Gastrointestinal: Positive for abdominal pain.  Endocrine: Negative.   Genitourinary: Negative.    Musculoskeletal: Negative.   Skin: Negative.   Neurological: Negative.   Hematological: Negative.     Psychiatric/Behavioral: Negative.     Physical Exam:  weight is 143 lb (64.9 kg). Her oral temperature is 98.3 F (36.8 C). Her blood pressure is 112/68 and her pulse is 73. Her respiration is 19 and oxygen saturation is 98%.   Wt Readings from Last 3 Encounters:  12/10/19 143 lb (64.9 kg)  10/14/19 139 lb 6.4 oz (63.2 kg)  09/23/19 140 lb (63.5 kg)    Physical Exam Vitals reviewed.  HENT:     Head: Normocephalic and atraumatic.  Eyes:     Pupils: Pupils are equal, round, and reactive to light.  Cardiovascular:     Rate and Rhythm: Normal rate and regular rhythm.     Heart sounds: Normal heart sounds.  Pulmonary:     Effort: Pulmonary effort is normal.     Breath sounds: Normal breath sounds.  Abdominal:     General: Bowel sounds are normal.     Palpations: Abdomen is soft.     Comments: Abdominal exam is soft.  She has good bowel sounds.  She has well-healed laparoscopy scar in the midline.  She has had no fluid wave.  There is no guarding or rebound tenderness.  There is no palpable abdominal mass.  Is no palpable liver or spleen tip.  Musculoskeletal:        General: No tenderness or deformity. Normal range of  motion.     Cervical back: Normal range of motion.  Lymphadenopathy:     Cervical: No cervical adenopathy.  Skin:    General: Skin is warm and dry.     Findings: No erythema or rash.  Neurological:     Mental Status: She is alert and oriented to person, place, and time.  Psychiatric:        Behavior: Behavior normal.        Thought Content: Thought content normal.        Judgment: Judgment normal.      Lab Results  Component Value Date   WBC 4.3 12/10/2019   HGB 13.8 12/10/2019   HCT 42.6 12/10/2019   MCV 92.8 12/10/2019   PLT 252 12/10/2019     Chemistry      Component Value Date/Time   NA 142 12/10/2019 0850   NA 142 05/21/2018 0000   K 4.4 12/10/2019 0850   CL 104 12/10/2019 0850   CO2 31 12/10/2019 0850   BUN 15 12/10/2019 0850   BUN 19  05/21/2018 0000   CREATININE 0.82 12/10/2019 0850   GLU 101 05/21/2018 0000      Component Value Date/Time   CALCIUM 9.9 12/10/2019 0850   ALKPHOS 55 12/10/2019 0850   AST 17 12/10/2019 0850   ALT 10 12/10/2019 0850   BILITOT 0.6 12/10/2019 0850      Impression and Plan: Kristen Stephenson is a very nice 69 year old white female.  She has a very interesting history.  She has low-grade mucinous neoplasm of the appendix.  It was not metastatic.  It was locally advanced.  There did not appear to be any adenopathy.  She underwent an incredibly aggressive resection followed by the intraperitoneal chemotherapy with mitomycin C.  There is certainly was a very aggressive way of treating this.  She was in apparently very good shape to be able to have this type of treatment.  We will have to see what the CT scan shows.  I cannot imagine that she would have recurrent disease.  However, with these low-grade neoplasms, recurrence is always possible.  For right now, we will plan to get her back in 1 year.  If there is some that we see on the CT scan that is suspicious, we will have to get her back much sooner.  It was a pleasure to see her.  It was fun talking to her about [place].  She really is very funny.  She is very active.  She definitely enjoys being down in the [place].     Volanda Napoleon, MD 9/2/202112:09 PM

## 2019-12-16 ENCOUNTER — Other Ambulatory Visit: Payer: Self-pay

## 2019-12-16 ENCOUNTER — Telehealth: Payer: Self-pay | Admitting: *Deleted

## 2019-12-16 ENCOUNTER — Encounter: Payer: Self-pay | Admitting: Family Medicine

## 2019-12-16 MED ORDER — SIMVASTATIN 20 MG PO TABS: ORAL_TABLET | ORAL | 3 refills | Status: DC

## 2019-12-16 NOTE — Telephone Encounter (Signed)
Message received from patient wanting to know if this office has received her records from [place].  Call placed back to patient and message left to notify her that records have not been received at this time.  Pt instructed to have records faxed to [phone] and to call office back with any other questions.

## 2019-12-24 ENCOUNTER — Telehealth: Payer: Self-pay

## 2019-12-24 NOTE — Telephone Encounter (Signed)
Pt is looking for a psychologist and she would like your recommendation. These are the 2 she is looking at.  Kristen Stephenson

## 2019-12-24 NOTE — Telephone Encounter (Signed)
Please advise 

## 2019-12-25 NOTE — Telephone Encounter (Signed)
Patient aware of recommendations per PCP  

## 2019-12-25 NOTE — Telephone Encounter (Signed)
I am not familiar with either of these counselors. There are MANY to choose from in the area and finding the right fit can be important.   I like ours: Please call [place] [facility] to schedule an appointment with Dr. Trey Paula; she is a therapist here at our [place] office.  The phone number is: [phone]

## 2020-02-01 ENCOUNTER — Encounter: Payer: PPO | Admitting: Family Medicine

## 2020-02-02 ENCOUNTER — Other Ambulatory Visit: Payer: Self-pay | Admitting: *Deleted

## 2020-02-02 NOTE — Progress Notes (Signed)
Pt called to verify records from [place] have been received. Media tab has imaging from [facility] and Dr. Dolores Frame office. Pt aware, thanked me for the call. No further concerns.

## 2020-02-03 ENCOUNTER — Ambulatory Visit (INDEPENDENT_AMBULATORY_CARE_PROVIDER_SITE_OTHER): Payer: PPO | Admitting: Family Medicine

## 2020-02-03 ENCOUNTER — Other Ambulatory Visit: Payer: Self-pay

## 2020-02-03 ENCOUNTER — Other Ambulatory Visit: Payer: Self-pay | Admitting: Family Medicine

## 2020-02-03 ENCOUNTER — Encounter: Payer: Self-pay | Admitting: Family Medicine

## 2020-02-03 ENCOUNTER — Telehealth: Payer: Self-pay

## 2020-02-03 VITALS — BP 118/78 | HR 69 | Temp 98.4°F | Wt 143.4 lb

## 2020-02-03 DIAGNOSIS — E782 Mixed hyperlipidemia: Secondary | ICD-10-CM | POA: Diagnosis not present

## 2020-02-03 DIAGNOSIS — N390 Urinary tract infection, site not specified: Secondary | ICD-10-CM

## 2020-02-03 DIAGNOSIS — R399 Unspecified symptoms and signs involving the genitourinary system: Secondary | ICD-10-CM

## 2020-02-03 DIAGNOSIS — Z780 Asymptomatic menopausal state: Secondary | ICD-10-CM

## 2020-02-03 DIAGNOSIS — Z0000 Encounter for general adult medical examination without abnormal findings: Secondary | ICD-10-CM | POA: Diagnosis not present

## 2020-02-03 DIAGNOSIS — E038 Other specified hypothyroidism: Secondary | ICD-10-CM | POA: Diagnosis not present

## 2020-02-03 DIAGNOSIS — M8580 Other specified disorders of bone density and structure, unspecified site: Secondary | ICD-10-CM

## 2020-02-03 DIAGNOSIS — K219 Gastro-esophageal reflux disease without esophagitis: Secondary | ICD-10-CM

## 2020-02-03 DIAGNOSIS — Z8543 Personal history of malignant neoplasm of ovary: Secondary | ICD-10-CM

## 2020-02-03 DIAGNOSIS — R918 Other nonspecific abnormal finding of lung field: Secondary | ICD-10-CM

## 2020-02-03 LAB — POCT URINALYSIS DIPSTICK
Bilirubin, UA: NEGATIVE
Glucose, UA: NEGATIVE
Ketones, UA: NEGATIVE
Nitrite, UA: NEGATIVE
Protein, UA: POSITIVE — AB
Spec Grav, UA: 1.015 (ref 1.010–1.025)
Urobilinogen, UA: 0.2 E.U./dL
pH, UA: 6 (ref 5.0–8.0)

## 2020-02-03 MED ORDER — ESTRADIOL 0.1 MG/GM VA CREA: TOPICAL_CREAM | VAGINAL | 5 refills | Status: DC

## 2020-02-03 MED ORDER — NITROFURANTOIN MACROCRYSTAL 50 MG PO CAPS: 50.0000 mg | ORAL_CAPSULE | Freq: Every day | ORAL | 2 refills | Status: DC | PRN

## 2020-02-03 MED ORDER — NITROFURANTOIN MONOHYD MACRO 100 MG PO CAPS: 100.0000 mg | ORAL_CAPSULE | Freq: Two times a day (BID) | ORAL | 0 refills | Status: DC

## 2020-02-03 NOTE — Telephone Encounter (Signed)
Pt went to pick up meds that were ordered for her this morning. The meds cost $85. She is asking if you can resend them to her other pharmacy, [facility] and she can see if they are cheaper there.

## 2020-02-03 NOTE — Telephone Encounter (Signed)
I asked her and all she said was the medication Dr. [name] prescribed her this morning.

## 2020-02-03 NOTE — Telephone Encounter (Signed)
Pt called back following up on prescription to be sent to [facility]. Please advise.

## 2020-02-03 NOTE — Telephone Encounter (Signed)
Please call patient and verify which medication. Always get this information before routing a message

## 2020-02-03 NOTE — Patient Instructions (Signed)
Please return in 12 months for your annual complete physical; please come fasting.  I will release your lab results to you on your MyChart account with further instructions. Please reply with any questions.    If you have any questions or concerns, please don't hesitate to send me a message via MyChart or call the office at [phone]. Thank you for visiting with [place] today! It's our pleasure caring for you.  Please call [place] [facility] to schedule an appointment with Dr. Trey Paula; she is a therapist here at our [place] office.  The phone number is: [phone]

## 2020-02-03 NOTE — Progress Notes (Signed)
Subjective  Chief Complaint  Patient presents with  . Annual Exam    non-fasting  . Urinary Tract Infection    frequency started Thursday  . Referral    requesting to start therapy    HPI: Kristen Stephenson is a 69 y.o. female who presents to [facility] [place] today for a Female Wellness Visit. She also has the concerns and/or needs as listed above in the chief complaint. These will be addressed in addition to the Health Maintenance Visit.   Wellness Visit: annual visit with health maintenance review and exam without Pap   HM: screens up to date. dexa w stable osteopenia now on calcium daily. imms up to date.  Chronic disease f/u and/or acute problem visit: (deemed necessary to be done in addition to the wellness visit):  Urinary frequency with odor changes x 4 days w/o f/c/s. H/o recurrent UTI. Postcoital. 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   Low mood:marital conflict. Struggling with irritablity of her husband; would like counseling to see if things can be better. Denies depression.   Stable pulm nodules.   Assessment  1. Annual physical exam   2. Mixed hyperlipidemia   3. History of ovarian cancer   4. Gastroesophageal reflux disease, unspecified whether esophagitis present   5. Osteopenia after menopause   6. Subclinical hypothyroidism   7. Pulmonary nodules   8. Recurrent UTI   9. UTI symptoms   10. Postcoital UTI - recurrent      Plan  Female Wellness Visit:  Age appropriate Health Maintenance and Prevention measures were discussed with patient. Included topics are cancer screening recommendations, ways to keep healthy (see AVS) including dietary and exercise recommendations, regular eye and dental care, use of seat belts, and avoidance of moderate alcohol use and tobacco use.   BMI: discussed  patient's BMI and encouraged positive lifestyle modifications to help get to or maintain a target BMI.  HM needs and immunizations were addressed and ordered. See below for orders. See HM and immunization section for updates.  Routine labs and screening tests ordered including cmp, cbc and lipids where appropriate.  Discussed recommendations regarding Vit D and calcium supplementation (see AVS)  Chronic disease management visit and/or acute problem visit:  Postcoital uti: treat, culture then start prophylaxis with macrobid 50 with intercourse; and vaginal estrogen cream prn. To urology if persistent recurrent infections.   HLD and thryoid: recheck. Had recent nl cmp and cbc. Tolerating statin.   Cancer sureveillance: stable and reviewed.   Mood: rec psychotherapy. Pt to schedule.   Follow up: 12 mo for cpe  Orders Placed This Encounter  Procedures  . Urine Culture  . T4, free  . T3  . TSH  . Lipid panel  . POCT Urinalysis Dipstick   Meds ordered this encounter  Medications  . nitrofurantoin, macrocrystal-monohydrate, (MACROBID) 100 MG capsule    Sig: Take 1 capsule (100 mg total) by mouth 2 (two) times daily.    Dispense:  10 capsule    Refill:  0  . estradiol (ESTRACE) 0.1 MG/GM vaginal cream    Sig: 1 applicator full nightly x 5 nights, then twice weekly    Dispense:  42.5 g    Refill:  5  . nitrofurantoin (MACRODANTIN) 50 MG capsule    Sig: Take 1 capsule (50 mg total) by  mouth daily as needed (for UTI prevention).    Dispense:  30 capsule    Refill:  2      Lifestyle: Body mass index is 24.61 kg/m. Wt Readings from Last 3 Encounters:  02/03/20 143 lb 6.4 oz (65 kg)  12/10/19 143 lb (64.9 kg)  10/14/19 139 lb 6.4 oz (63.2 kg)   Diet:   Patient Active Problem List   Diagnosis Date Noted  . 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    Priority: High  . History of ovarian cancer 09/16/2018    Priority: High  . Mixed hyperlipidemia 09/16/2018     Priority: High  . Osteopenia after menopause 09/01/2019    Priority: Medium    dexa 08/2019   . Subclinical hypothyroidism 06/15/2019    Priority: Medium  . Anxiety 09/16/2018    Priority: Medium  . GERD (gastroesophageal reflux disease) 09/16/2018    Priority: Medium  . Postcoital UTI - recurrent 02/03/2020    Started macrobid prophylaxis oct 2021 with premarin vaginal cream   . Pulmonary nodules 09/23/2019    Stable on fu chest CT 12/2019   . Presbycusis of both ears 09/23/2019   Health Maintenance  Topic Date Due  . MAMMOGRAM  08/31/2020  . DEXA SCAN  09/01/2022  . COLONOSCOPY  10/08/2022  . TETANUS/TDAP  05/15/2026  . INFLUENZA VACCINE  Completed  . COVID-19 Vaccine  Completed  . Hepatitis C Screening  Completed  . PNA vac Low Risk Adult  Completed   Immunization History  Administered Date(s) Administered  . Influenza, High Dose Seasonal PF 12/21/2019  . Influenza-Unspecified 04/28/2014, 05/09/2015, 05/15/2016, 05/20/2017, 05/22/2018, 11/21/2018  . PFIZER SARS-COV-2 Vaccination 05/24/2019, 06/16/2019  . Pneumococcal Conjugate-13 10/01/2017  . Pneumococcal Polysaccharide-23 11/21/2018  . Tdap 05/15/2016  . Zoster 04/28/2014  . Zoster Recombinat (Shingrix) 09/30/2019, 12/21/2019   We updated and reviewed the patient's past history in detail and it is documented below. Allergies: Patient has No Known Allergies. Past Medical History Patient  has a past medical history of Adjustment disorder with anxiety, Anxiety, Barrett's esophagus, H/O ovarian cancer, History of malignant neoplasm of appendix, Hyperlipidemia, Hyperlipidemia, Lung nodule, Osteopenia, Osteopenia after menopause (09/01/2019), and Subclinical hypothyroidism (06/15/2019). Past Surgical History Patient  has a past surgical history that includes Abdominal hysterectomy; Appendectomy (2018); Arthroscopic repair ACL (Left); Femur fracture surgery (Right); Cyst excision (Right, 1972); Tonsillectomy; Ankle surgery  (2003); Vulvar lesion removal (07/2007); Inguinal hernia repair (11/2007); and Breast excisional biopsy. Family History: Patient family history includes Asthma in her sister; CAD in her mother; Diabetes in her father and maternal grandmother; Osteoporosis in her mother; Rheum arthritis in her sister. Social History:  Patient  reports that she quit smoking about 28 years ago. Her smoking use included cigarettes. She has never used smokeless tobacco. She reports current alcohol use. She reports that she does not use drugs.  Review of Systems: Constitutional: negative for fever or malaise Ophthalmic: negative for photophobia, double vision or loss of vision Cardiovascular: negative for chest pain, dyspnea on exertion, or new LE swelling Respiratory: negative for SOB or persistent cough Gastrointestinal: negative for abdominal pain, change in bowel habits or melena Genitourinary: negative for dysuria or gross hematuria, no abnormal uterine bleeding or disharge Musculoskeletal: negative for new gait disturbance or muscular weakness Integumentary: negative for new or persistent rashes, no breast lumps Neurological: negative for TIA or stroke symptoms Psychiatric: negative for SI or delusions Allergic/Immunologic: negative for hives  Patient Care Team    Relationship Specialty Notifications  Start End  [name], MD PCP - General Family Medicine  09/23/19   Tish Men, MD (Inactive) Consulting Physician Hematology  04/05/19     Objective  Vitals: BP 118/78   Pulse 69   Temp 98.4 F (36.9 C) (Temporal)   Wt 143 lb 6.4 oz (65 kg)   SpO2 97%   BMI 24.61 kg/m  General:  Well developed, well nourished, no acute distress  Psych:  Alert and orientedx3,normal mood and affect HEENT:  Normocephalic, atraumatic, non-icteric sclera,  supple neck without adenopathy, mass or thyromegaly Cardiovascular:  Normal S1, S2, RRR without gallop, rub or murmur Respiratory:  Good breath sounds bilaterally,  CTAB with normal respiratory effort Gastrointestinal: normal bowel sounds, soft, non-tender, no noted masses. No HSM, no cva or bladder ttp MSK: no deformities, contusions. Joints are without erythema or swelling.  Skin:  Warm, no rashes or suspicious lesions noted Neurologic:    Mental status is normal. CN 2-11 are normal. Gross motor and sensory exams are normal. Normal gait. No tremor Breast Exam: No mass, skin retraction or nipple discharge is appreciated in either breast. No axillary adenopathy. Fibrocystic changes are not noted     Commons side effects, risks, benefits, and alternatives for medications and treatment plan prescribed today were discussed, and the patient expressed understanding of the given instructions. Patient is instructed to call or message via MyChart if he/she has any questions or concerns regarding our treatment plan. No barriers to understanding were identified. We discussed Red Flag symptoms and signs in detail. Patient expressed understanding regarding what to do in case of urgent or emergency type symptoms.   Medication list was reconciled, printed and provided to the patient in AVS. Patient instructions and summary information was reviewed with the patient as documented in the AVS. This note was prepared with assistance of Dragon voice recognition software. Occasional wrong-word or sound-a-like substitutions may have occurred due to the inherent limitations of voice recognition software  This visit occurred during the SARS-CoV-2 public health emergency.  Safety protocols were in place, including screening questions prior to the visit, additional usage of staff PPE, and extensive cleaning of exam room while observing appropriate contact time as indicated for disinfecting solutions.

## 2020-02-03 NOTE — Telephone Encounter (Signed)
All meds ordered today sent to [facility] per patient request

## 2020-02-04 LAB — LIPID PANEL
Cholesterol: 199 mg/dL (ref <200)
HDL: 90 mg/dL (ref >=50)
LDL Cholesterol (Calc): 94 mg/dL (calc)
Non-HDL Cholesterol (Calc): 109 mg/dL (calc) (ref <130)
Total CHOL/HDL Ratio: 2.2 (calc) (ref <5.0)
Triglycerides: 68 mg/dL (ref <150)

## 2020-02-04 LAB — T4, FREE: Free T4: 1.1 ng/dL (ref 0.8–1.8)

## 2020-02-04 LAB — TSH: TSH: 3.66 mIU/L (ref 0.40–4.50)

## 2020-02-04 LAB — T3: T3, Total: 96 ng/dL (ref 76–181)

## 2020-02-06 LAB — URINE CULTURE
MICRO NUMBER:: 11125514
SPECIMEN QUALITY:: ADEQUATE

## 2020-05-12 ENCOUNTER — Ambulatory Visit: Payer: PPO

## 2020-05-13 ENCOUNTER — Ambulatory Visit: Payer: PPO

## 2020-05-16 ENCOUNTER — Telehealth: Payer: Self-pay

## 2020-05-16 NOTE — Telephone Encounter (Signed)
error 

## 2020-05-16 NOTE — Telephone Encounter (Signed)
  LAST APPOINTMENT DATE:02/03/2020   NEXT APPOINTMENT DATE:@Visit  date not found  MEDICATION:nitrofurantoin, macrocrystal-monohydrate, (MACROBID) 100 MG capsule  PHARMACY: [facility] [place], [place] [address]    COMMENTS: Patient is current is [place] for a death in family and thinks she may have a UTI

## 2020-05-17 ENCOUNTER — Telehealth: Payer: Self-pay

## 2020-05-17 NOTE — Telephone Encounter (Signed)
..  Patient states she is not in [place] at the moment, and will just take amoxicillin to help the issue.

## 2020-05-17 NOTE — Telephone Encounter (Signed)
Please schedule pt for an office visit.  Thank You

## 2020-05-17 NOTE — Telephone Encounter (Signed)
nitrofurantoin, macrocrystal-monohydrate, (MACROBID) 100 MG capsule  Pt is in [place] for her sister's funeral. She believes she has another UTI and left her medication back in [place]. She is asking if [facility] that [facility] get [facility] and pick up there. Pt is requesting just enough to get through the funeral ( 4 days worth)

## 2020-05-17 NOTE — Telephone Encounter (Signed)
[facility] [place], [place]

## 2020-05-17 NOTE — Telephone Encounter (Signed)
Pt states she has refills at [facility] here for the medication, but there is not a Public house manager near her in [place]. She is asking if [facility]

## 2020-05-17 NOTE — Telephone Encounter (Signed)
Pt will need to be seen before prescribing this medication.

## 2020-05-17 NOTE — Telephone Encounter (Signed)
Contacted Yarissa-Esther and she states that

## 2020-05-17 NOTE — Telephone Encounter (Signed)
I spoke with the pt to make her aware that medication cannot be prescribed, unless seen in the office or in Urgent Care. Pt voiced understanding.

## 2020-07-25 ENCOUNTER — Telehealth: Payer: Self-pay | Admitting: Family Medicine

## 2020-07-25 NOTE — Telephone Encounter (Signed)
Left message for patient to call back and schedule Medicare Annual Wellness Visit (AWV) either virtually OR in office.   Welcome to medicare exam 04/06/19; please schedule at anytime with [facility]Nurse Health Advisor at [facility].  This should be a 45 minute visit.

## 2020-07-26 ENCOUNTER — Telehealth: Payer: Self-pay | Admitting: Family Medicine

## 2020-07-26 NOTE — Chronic Care Management (AMB) (Signed)
  Chronic Care Management   Outreach Note  07/26/2020 Name: Kristen Stephenson MRN: [id number] DOB: Aug 01, 1950  Referred by: [name], MD Reason for referral : No chief complaint on file.   An unsuccessful telephone outreach was attempted today. The patient was referred to the pharmacist for assistance with care management and care coordination.   Follow Up Plan:   Lauretta Grill Upstream Scheduler

## 2020-07-26 NOTE — Chronic Care Management (AMB) (Signed)
  Chronic Care Management   Note  07/26/2020 Name: Quenisha Lovins MRN: [id number] DOB: April 11, 1950  Erskine Speed Kraker is a 70 y.o. year old female who is a primary care patient of [name], MD. I reached out to Alta Corning by phone today in response to a referral sent by Ms. Erskine Speed Bougie's PCP, [name], MD.   Ms. Woolford was given information about Chronic Care Management services today including:  1. CCM service includes personalized support from designated clinical staff supervised by her physician, including individualized plan of care and coordination with other care providers 2. 24/7 contact phone numbers for assistance for urgent and routine care needs. 3. Service will only be billed when office clinical staff spend 20 minutes or more in a month to coordinate care. 4. Only one practitioner may furnish and bill the service in a calendar month. 5. The patient may stop CCM services at any time (effective at the end of the month) by phone call to the office staff.   Patient agreed to services and verbal consent obtained.   Follow up plan:   Lauretta Grill Upstream Scheduler

## 2020-08-02 ENCOUNTER — Other Ambulatory Visit: Payer: Self-pay | Admitting: Family Medicine

## 2020-08-02 DIAGNOSIS — Z1231 Encounter for screening mammogram for malignant neoplasm of breast: Secondary | ICD-10-CM

## 2020-08-17 ENCOUNTER — Telehealth: Payer: PPO

## 2020-08-23 ENCOUNTER — Encounter: Payer: Self-pay | Admitting: Family Medicine

## 2020-08-29 ENCOUNTER — Telehealth: Payer: PPO

## 2020-09-16 ENCOUNTER — Encounter (HOSPITAL_COMMUNITY): Payer: Self-pay

## 2020-09-16 ENCOUNTER — Ambulatory Visit (HOSPITAL_COMMUNITY)
Admission: EM | Admit: 2020-09-16 | Discharge: 2020-09-16 | Disposition: A | Discharge status: Home / Self Care | Payer: PPO

## 2020-09-16 ENCOUNTER — Other Ambulatory Visit: Payer: Self-pay

## 2020-09-16 ENCOUNTER — Telehealth: Payer: Self-pay

## 2020-09-16 DIAGNOSIS — S70362A Insect bite (nonvenomous), left thigh, initial encounter: Secondary | ICD-10-CM | POA: Diagnosis not present

## 2020-09-16 DIAGNOSIS — W57XXXA Bitten or stung by nonvenomous insect and other nonvenomous arthropods, initial encounter: Secondary | ICD-10-CM | POA: Diagnosis not present

## 2020-09-16 NOTE — ED Provider Notes (Signed)
[place]    CSN: [id number] Arrival date & time: 09/16/20  1328      History   Chief Complaint Chief Complaint  Patient presents with   Animal Bite    HPI Kristen Stephenson is a 70 y.o. female.   Patient here for tick bite to left upper thigh.  Reports putting on lotion and felt something on her upper thigh.  She removed it and noticed it that was a tick.  Patient did bring tick in a plastic bag.  No rash noted.  Reports going hiking yesterday.  Denies any trauma, injury, or other precipitating event.  Denies any specific alleviating or aggravating factors.  Denies any fevers, chest pain, shortness of breath, N/V/D, numbness, tingling, weakness, abdominal pain, or headaches.    The history is provided by the patient.  Animal Bite  Past Medical History:  Diagnosis Date   Adjustment disorder with anxiety    Anxiety    Barrett's esophagus    [facility] Provider Based [facility]Needs Endoscopy in 2023   H/O ovarian cancer    History of malignant neoplasm of appendix    Hyperlipidemia    Hyperlipidemia    Lung nodule    Osteopenia    Osteopenia after menopause 09/01/2019   dexa 08/2019   Subclinical hypothyroidism 06/15/2019    Patient Active Problem List   Diagnosis Date Noted   Postcoital UTI - recurrent 02/03/2020   Pulmonary nodules 09/23/2019   Presbycusis of both ears 09/23/2019   Osteopenia after menopause 09/01/2019   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   Anxiety 09/16/2018   History of ovarian cancer 09/16/2018   Mixed hyperlipidemia 09/16/2018   GERD (gastroesophageal reflux disease) 09/16/2018    Past Surgical History:  Procedure Laterality Date   ABDOMINAL HYSTERECTOMY     ANKLE SURGERY  2003   APPENDECTOMY  2018   Cancer/S/P resection and chemo   ARTHROSCOPIC REPAIR ACL Left    BREAST EXCISIONAL BIOPSY     CYST EXCISION Right 1972   Right breast/benign   FEMUR FRACTURE SURGERY Right    Plate with 5 screws   INGUINAL HERNIA  REPAIR  11/2007   TONSILLECTOMY     VULVAR LESION REMOVAL  07/2007   benign    OB History   No obstetric history on file.      Home Medications    Prior to Admission medications   Medication Sig Start Date End Date Taking? Authorizing Provider  aspirin EC 81 MG tablet Take 81 mg by mouth daily.    [provider]  Calcium Carbonate-Vit D-Min (CALCIUM 1200 PO) Take by mouth.    [provider]  cholecalciferol (VITAMIN D3) 25 MCG (1000 UNIT) tablet Take 1,000 Units by mouth daily.    [provider]  estradiol (ESTRACE) 0.1 MG/GM vaginal cream 1 applicator full nightly x 5 nights, then twice weekly 02/03/20   [name], MD  nitrofurantoin (MACRODANTIN) 50 MG capsule Take 1 capsule (50 mg total) by mouth daily as needed (for UTI prevention). 02/03/20   [name], MD  nitrofurantoin, macrocrystal-monohydrate, (MACROBID) 100 MG capsule Take 1 capsule (100 mg total) by mouth 2 (two) times daily. 02/03/20   [name], MD  omeprazole (PRILOSEC) 20 MG capsule Take 20 mg by mouth daily.    [provider]  simvastatin (ZOCOR) 20 MG tablet TAKE 1 TABLET BY MOUTH EVERYDAY AT BEDTIME 12/16/19   [name], MD    Family  History Family History  Problem Relation Age of Onset   CAD Mother    Osteoporosis Mother    Diabetes Father    Rheum arthritis Sister    Asthma Sister    Diabetes Maternal Grandmother        amputation    Social History Social History   Tobacco Use   Smoking status: Former    Pack years: 0.00    Types: Cigarettes    Quit date: 1993    Years since quitting: 29.4   Smokeless tobacco: Never  Vaping Use   Vaping Use: Never used  Substance Use Topics   Alcohol use: Yes    Comment: Occas   Drug use: Never     Allergies   Patient has no known allergies.   Review of Systems Review of Systems  Skin:  Positive for wound.  All other systems reviewed and are negative.   Physical Exam Triage Vital  Signs ED Triage Vitals  Enc Vitals Group     BP 09/16/20 1345 126/77     Pulse Rate 09/16/20 1345 64     Resp 09/16/20 1345 16     Temp 09/16/20 1345 98.2 F (36.8 C)     Temp Source 09/16/20 1345 Oral     SpO2 09/16/20 1345 98 %     Weight --      Height --      Head Circumference --      Peak Flow --      Pain Score 09/16/20 1343 0     Pain Loc --      Pain Edu? --      Excl. in [place]? --    No data found.  Updated Vital Signs BP 126/77 (BP Location: Right Arm)   Pulse 64   Temp 98.2 F (36.8 C) (Oral)   Resp 16   SpO2 98%   Visual Acuity Right Eye Distance:   Left Eye Distance:   Bilateral Distance:    Right Eye Near:   Left Eye Near:    Bilateral Near:     Physical Exam Vitals and nursing note reviewed.  Constitutional:      General: She is not in acute distress.    Appearance: Normal appearance. She is not ill-appearing, toxic-appearing or diaphoretic.  HENT:     Head: Normocephalic and atraumatic.  Eyes:     Conjunctiva/sclera: Conjunctivae normal.  Cardiovascular:     Rate and Rhythm: Normal rate.     Pulses: Normal pulses.  Pulmonary:     Effort: Pulmonary effort is normal.  Abdominal:     General: Abdomen is flat.  Musculoskeletal:        General: Normal range of motion.     Cervical back: Normal range of motion.  Skin:    General: Skin is warm and dry.     Findings: Wound (small red area to left upper thigh where patient reports that she removed the tick, no rash noted) present.  Neurological:     General: No focal deficit present.     Mental Status: She is alert and oriented to person, place, and time.  Psychiatric:        Mood and Affect: Mood normal.     UC Treatments / Results  Labs (all labs ordered are listed, but only abnormal results are displayed) Labs Reviewed - No data to display  EKG   Radiology No results found.  Procedures Procedures (including critical care time)  Medications Ordered in UC  Medications - No data  to display  Initial Impression / Assessment and Plan / UC Course  I have reviewed the triage vital signs and the nursing notes.  Pertinent labs & imaging results that were available during my care of the patient were reviewed by me and considered in my medical decision making (see chart for details).    Assessment negative for red flags or concerns.  Wash bite twice a day with warm soapy water.  You can apply antibiotic ointment as needed.  Follow up for any signs of infection or rash.  Patient given information on tick born illnesses and told to follow up if she develops any symptoms.  Follow up with primary care as needed.   Final Clinical Impressions(s) / UC Diagnoses   Final diagnoses:  Tick bite of left thigh, initial encounter     Discharge Instructions      Wash the bite twice a day with warm soapy water.  You can apply neosporin or antibiotic ointment twice a day as needed.    Return or go to the Emergency Department if you develop a rash or any new symptoms or concerns.        ED Prescriptions   None    PDMP not reviewed this encounter.   Pearson Forster, NP 09/16/20 1420

## 2020-09-16 NOTE — ED Triage Notes (Signed)
Pt reports she removed a tick from the left thigh 1 hr ago. Denies fever; redness swelling itching in the area.

## 2020-09-16 NOTE — Chronic Care Management (AMB) (Signed)
Chronic Care Management Pharmacy Assistant   Name: Kristen Stephenson  MRN: [id number] DOB: [birth year]  Kristen Stephenson is an 70 y.o. year old female who presents for his initial CCM visit with the clinical pharmacist.  Primary concerns for visit include:   Recent office visits:  None  Recent consult visits:  None  Hospital visits:  Medication Reconciliation was completed by comparing discharge summary, patient's EMR and Pharmacy list, and upon discussion with patient.  Admitted to the hospital on 09/16/20 due to [place]. Discharge date was 09/16/20. Discharged from [place]?Medications Started at [facility] Discharge:?? -started NONE   Medication Changes at Hospital Discharge: -Changed NONE  Medications Discontinued at Hospital Discharge: -Stopped NONE  Medications that remain the same after Hospital Discharge:??  -All other medications will remain the same.    Medications: Outpatient Encounter Medications as of 09/16/2020  Medication Sig   aspirin EC 81 MG tablet Take 81 mg by mouth daily.   Calcium Carbonate-Vit D-Min (CALCIUM 1200 PO) Take by mouth.   cholecalciferol (VITAMIN D3) 25 MCG (1000 UNIT) tablet Take 1,000 Units by mouth daily.   estradiol (ESTRACE) 0.1 MG/GM vaginal cream 1 applicator full nightly x 5 nights, then twice weekly   nitrofurantoin (MACRODANTIN) 50 MG capsule Take 1 capsule (50 mg total) by mouth daily as needed (for UTI prevention).   nitrofurantoin, macrocrystal-monohydrate, (MACROBID) 100 MG capsule Take 1 capsule (100 mg total) by mouth 2 (two) times daily.   omeprazole (PRILOSEC) 20 MG capsule Take 20 mg by mouth daily.   simvastatin (ZOCOR) 20 MG tablet TAKE 1 TABLET BY MOUTH EVERYDAY AT BEDTIME   No facility-administered encounter medications on file as of 09/16/2020.   Current Documented Medications  Aspirin 81mg  OTC (DISCONTINUED) Calcium Carbonate Vit D OTC Vitamin D3  OTC Esradionl 0.1mg /gm   Nitrofurantoin 100mg   Last filled 10/14/19  5DS Omeprazole 20mg      Have you seen any other providers since your last visit? Patient went to Urgent care last Friday for a tick bite.   Any changes in your medications or health?  Patient denies recent changes.  Per patient, Dr [name] told her to DISCONTINUE Aspirin at the last visit.  Patient stated she is not good about taking calcium and vitamin D, but plans to do better on taking those daily.  Any side effects from any medications?  Patient denies side effects from medications.   Do you have an symptoms or problems not managed by your medications?  Patient denies unmanaged symptoms or problems.   Any concerns about your health right now?  Patient does not have concerns about her health right now.   Has your provider asked that you check blood pressure, blood sugar, or follow special diet at home?  Patient does not monitor BP, BS or follow a special diet.   Do you get any type of exercise on a regular basis?  Patient stated she and her husband bike or hike everyday, 7 days per week.  Last week they completed 57 miles for the week.   Can you think of a goal you would like to reach for your health?  Patient does not have a goal in mind.   Do you have any problems getting your medications?  Patient does not have problems obtaining medications.   Is there anything that you would like to discuss during the appointment?  Patient does not have anything specific to discuss.   Please bring  medications and supplements to appointment  Appt confirmed   Star Rating Drugs:  Simvastatin 20mg   last filled 08/17/20  [facility]  [place]

## 2020-09-16 NOTE — Discharge Instructions (Addendum)
Wash the bite twice a day with warm soapy water.  You can apply neosporin or antibiotic ointment twice a day as needed.    Return or go to the Emergency Department if you develop a rash or any new symptoms or concerns.

## 2020-09-20 ENCOUNTER — Ambulatory Visit (INDEPENDENT_AMBULATORY_CARE_PROVIDER_SITE_OTHER): Payer: PPO

## 2020-09-20 DIAGNOSIS — Z780 Asymptomatic menopausal state: Secondary | ICD-10-CM

## 2020-09-20 DIAGNOSIS — E782 Mixed hyperlipidemia: Secondary | ICD-10-CM | POA: Diagnosis not present

## 2020-09-20 DIAGNOSIS — K219 Gastro-esophageal reflux disease without esophagitis: Secondary | ICD-10-CM

## 2020-09-20 NOTE — Progress Notes (Signed)
Chronic Care Management Pharmacy Note  09/20/2020 Name:  Kristen Stephenson MRN:  [id number] DOB:  Dec 15, 1950  Recommendations/Changes made from today's visit: No Rx changes  Subjective: Kristen Stephenson is an 70 y.o. year old female who is a primary patient of [name], MD.  The CCM team was consulted for assistance with disease management and care coordination needs.    Engaged with patient by telephone for initial visit in response to provider referral for pharmacy case management and/or care coordination services.   Consent to Services:  The patient was given the following information about Chronic Care Management services today, agreed to services, and gave verbal consent: 1. CCM service includes personalized support from designated clinical staff supervised by the primary care provider, including individualized plan of care and coordination with other care providers 2. 24/7 contact phone numbers for assistance for urgent and routine care needs. 3. Service will only be billed when office clinical staff spend 20 minutes or more in a month to coordinate care. 4. Only one practitioner may furnish and bill the service in a calendar month. 5.The patient may stop CCM services at any time (effective at the end of the month) by phone call to the office staff. 6. The patient will be responsible for cost sharing (co-pay) of up to 20% of the service fee (after annual deductible is met). Patient agreed to services and consent obtained.  Patient Care Team: [name], MD as PCP - General (Family Medicine) Tish Men, MD (Inactive) as Consulting Physician (Hematology) Madelin Rear, [facility] as Pharmacist (Pharmacist)  Objective:  Lab Results  Component Value Date   CREATININE 0.82 12/10/2019   CREATININE 0.78 12/09/2018   CREATININE 0.8 05/21/2018    Lab Results  Component Value Date   HGBA1C 6.1 05/21/2018   Last diabetic Eye exam: No results found for: HMDIABEYEEXA  Last  diabetic Foot exam: No results found for: HMDIABFOOTEX      Component Value Date/Time   CHOL 199 02/03/2020 1011   TRIG 68 02/03/2020 1011   HDL 90 02/03/2020 1011   CHOLHDL 2.2 02/03/2020 1011   VLDL 26.0 04/09/2019 0848   LDLCALC 94 02/03/2020 1011    Hepatic Function Latest Ref Rng & Units 12/10/2019 12/09/2018 05/21/2018  Total Protein 6.5 - 8.1 g/dL 6.8 6.6 -  Albumin 3.5 - 5.0 g/dL 4.3 4.2 -  AST 15 - 41 U/L 17 14(L) 18  ALT 0 - 44 U/L _0 Alk Phosphatase 38 - 126 U/L 55 54 47  Total Bilirubin 0.3 - 1.2 mg/dL 0.6 0.6 -    Lab Results  Component Value Date/Time   TSH 3.66 02/03/2020 10:11 AM   TSH 6.48 (H) 05/21/2019 08:01 AM   FREET4 1.1 02/03/2020 10:11 AM   FREET4 0.80 05/21/2019 08:01 AM    CBC Latest Ref Rng & Units 12/10/2019 04/09/2019 12/09/2018  WBC 4.0 - 10.5 K/uL 4.3 3.9(L) 6.0  Hemoglobin 12.0 - 15.0 g/dL 13.8 13.9 13.5  Hematocrit 36.0 - 46.0 % 42.6 43.0 41.3  Platelets 150 - 400 K/uL 252 235.0 277    No results found for: VD25OH  Clinical ASCVD:  The 10-year ASCVD risk score Mikey Bussing DC Jr., et al., 2013) is: 6.5%   Values used to calculate the score:     Age: 43 years     Sex: Female     Is Non-Hispanic African American: No     Diabetic: No     Tobacco smoker: No  Systolic Blood Pressure: 169 mmHg     Is BP treated: No     HDL Cholesterol: 90 mg/dL     Total Cholesterol: 199 mg/dL    Social History   Tobacco Use  Smoking Status Former   Pack years: 0.00   Types: Cigarettes   Quit date: 1993   Years since quitting: 29.4  Smokeless Tobacco Never   BP Readings from Last 3 Encounters:  09/16/20 126/77  02/03/20 118/78  12/10/19 112/68   Pulse Readings from Last 3 Encounters:  09/16/20 64  02/03/20 69  12/10/19 73   Wt Readings from Last 3 Encounters:  02/03/20 143 lb 6.4 oz (65 kg)  12/10/19 143 lb (64.9 kg)  10/14/19 139 lb 6.4 oz (63.2 kg)    Assessment: Review of patient past medical history, allergies, medications, health  status, including review of consultants reports, laboratory and other test data, was performed as part of comprehensive evaluation and provision of chronic care management services.   SDOH:  (Social Determinants of Health) assessments and interventions performed: Yes   CCM Care Plan  No Known Allergies  Medications Reviewed Today     Reviewed by Esperanza Sheets, CMA (Certified Medical Assistant) on 09/16/20 at 1345  Med List Status: <None>   Medication Order Taking? Sig Documenting Provider Last Dose Status Informant  aspirin EC 81 MG tablet [id number]  Take 81 mg by mouth daily. [provider]  Active   Calcium Carbonate-Vit D-Min (CALCIUM 1200 PO) [id number]  Take by mouth. [provider]  Consider Medication Status and Discontinue   cholecalciferol (VITAMIN D3) 25 MCG (1000 UNIT) tablet [id number]  Take 1,000 Units by mouth daily. [provider]  Consider Medication Status and Discontinue   estradiol (ESTRACE) 0.1 MG/GM vaginal cream [id number]  1 applicator full nightly x 5 nights, then twice weekly [name], MD  Consider Medication Status and Discontinue   nitrofurantoin (MACRODANTIN) 50 MG capsule [id number]  Take 1 capsule (50 mg total) by mouth daily as needed (for UTI prevention). [name], MD  Consider Medication Status and Discontinue   nitrofurantoin, macrocrystal-monohydrate, (MACROBID) 100 MG capsule [id number]  Take 1 capsule (100 mg total) by mouth 2 (two) times daily. [name], MD  Consider Medication Status and Discontinue   omeprazole (PRILOSEC) 20 MG capsule [id number]  Take 20 mg by mouth daily. 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   Anxiety 09/16/2018   History of ovarian cancer 09/16/2018   Mixed hyperlipidemia 09/16/2018   GERD (gastroesophageal reflux disease) 09/16/2018    Immunization History  Administered Date(s) Administered   Influenza, High Dose Seasonal PF 12/21/2019   Influenza-Unspecified 04/28/2014, 05/09/2015, 05/15/2016, 05/20/2017, 05/22/2018, 11/21/2018   PFIZER Comirnaty(Gray Top)Covid-19 Tri-Sucrose Vaccine 07/26/2020   PFIZER(Purple Top)SARS-COV-2 Vaccination 05/24/2019, 06/16/2019, 03/16/2020   Pneumococcal Conjugate-13 10/01/2017   Pneumococcal Polysaccharide-23 11/21/2018   Tdap 05/15/2016   Zoster Recombinat (Shingrix) 09/30/2019, 12/21/2019   Zoster, Live 04/28/2014    Conditions to be addressed/monitored: Osteopenia, HLD, GERD  Care Plan :  CCM Pharmacy Care Plan  Updates made by Madelin Rear, [facility] since 09/20/2020 12:00 AM     Problem: HLD Osteopenia GERD   Priority: High     Long-Range Goal: Disease Management   Start Date: 09/20/2020  Expected End Date: 09/20/2021  This Visit's Progress: On track  Priority: High  Note:   Current Barriers:  Working towards consolidation/taking less medications every day  Pharmacist Clinical Goal(s):  Patient will contact provider office for questions/concerns as evidenced notation of same in electronic health record through collaboration with PharmD and provider.   Interventions: 1:1 collaboration with [name], MD regarding development and update of comprehensive plan of care as evidenced by provider attestation and co-signature Inter-disciplinary care team collaboration (see longitudinal plan of care) Comprehensive medication review performed; medication list updated in electronic medical record No Rx changes  Hyperlipidemia: (LDL goal < 100) -Controlled -Current treatment: Simvastatin 20 mg once  daily -Medications previously tried: n/a  -Current dietary patterns: minimal process foods. White meat, diary, some chocolate.  -Current exercise habits: active most days with hiking/biking -Educated on Cholesterol goals;  Importance of limiting foods high in cholesterol; -Recommended to continue current medication  Osteopenia (Goal ensure appropriate supplementation) -Controlled -Last DEXA Scan: 08/2019  -Patient is not a candidate for pharmacologic treatment -Current treatment  Calcium-Vitamin D3 supplementation once daily -Recommend [phone] units of vitamin D daily. Recommend 1200 mg of calcium daily from dietary and supplemental sources. Recommend weight-bearing and muscle strengthening exercises for building and maintaining bone density. -Recommended to continue current medication  GERD (Goal: minimize acid reflux symptoms) -Controlled -Current treatment  Omeprazole 20 mg once daily -Reviewed acid reflux triggers in detail - will cut out some dairy, lemon juice, chocolate  -Recommended to continue current medication Plan to take half of omeprazole 20 mg tablet (10 mg) once daily for next two weeks, will further taper as appropriate at that point. Supplement with pepcid 10 mg as needed.   Patient Goals/Self-Care Activities Patient will:  - take medications as prescribed engage in dietary modifications by minimizing acid reflux triggers  Follow Up Plan: CPA telephone call ~2 weeks to review acid reflux symptoms and progress with cutting down on omeprazole 10 mg Pharmacist f/u telephone call 6 months or sooner if needed  Medication Assistance: None required.  Patient affirms current coverage meets needs.    Patient's preferred pharmacy is:  [id number] [place], [place] [id number]: [phone]: [address] [place] [address]Phone: [phone]: [phone] Follow Up:  Patient agrees to Care Plan and Follow-up.  Future Appointments  Date Time Provider DFalling [place] 09/21/2020  1:30 PM [facility] MM 3 [facility] CE  12/09/2020  8:30 AM [facility] LAB [facility] None  12/09/2020 10:30 AM [place], PRudell Cobb MD [facility] None  02/03/2021  9:00 AM ALeamon Arnt MD [facility] PSan German PharmD, CPP Clinical Pharmacist Practitioner  [facility]  [phone]

## 2020-09-20 NOTE — Patient Instructions (Signed)
Ms. Housewright,  Thank you for talking with me today. I have included our care plan/goals in the following pages.   Please review and call me at [phone] with any questions.  Thanks! Ellin Mayhew, Pharm.D., BCGP Clinical Pharmacist [facility] [phone] Patient Care Plan: [place] Plan     Problem Identified: HLD Osteopenia GERD   Priority: High     Long-Range Goal: Disease Management   Start Date: 09/20/2020  Expected End Date: 09/20/2021  This Visit's Progress: On track  Priority: High  Note:   Current Barriers:  Working towards consolidation/taking less medications every day  Pharmacist Clinical Goal(s):  Patient will contact provider office for questions/concerns as evidenced notation of same in electronic health record through collaboration with PharmD and provider.   Interventions: 1:1 collaboration with [name], MD regarding development and update of comprehensive plan of care as evidenced by provider attestation and co-signature Inter-disciplinary care team collaboration (see longitudinal plan of care) Comprehensive medication review performed; medication list updated in electronic medical record No Rx changes  Hyperlipidemia: (LDL goal < 100) -Controlled -Current treatment: Simvastatin 20 mg once daily -Medications previously tried: n/a  -Current dietary patterns: minimal process foods. White meat, diary, some chocolate.  -Current exercise habits: active most days with hiking/biking -Educated on Cholesterol goals;  Importance of limiting foods high in cholesterol; -Recommended to continue current medication  Osteopenia (Goal ensure appropriate supplementation) -Controlled -Last DEXA Scan: 08/2019  -Patient is not a candidate for pharmacologic treatment -Current treatment  Calcium-Vitamin D3 supplementation once daily -Recommend [phone] units of vitamin D daily. Recommend 1200  mg of calcium daily from dietary and supplemental sources. Recommend weight-bearing and muscle strengthening exercises for building and maintaining bone density. -Recommended to continue current medication  GERD (Goal: minimize acid reflux symptoms) -Controlled -Current treatment  Omeprazole 20 mg once daily -Reviewed acid reflux triggers in detail - will cut out some dairy, lemon juice, chocolate  -Recommended to continue current medication Plan to take half of omeprazole 20 mg tablet (10 mg) once daily for next two weeks, will further taper as appropriate at that point. Supplement with pepcid 10 mg as needed.   Patient Goals/Self-Care Activities Patient will:  - take medications as prescribed engage in dietary modifications by minimizing acid reflux triggers  Follow Up Plan: CPA telephone call ~2 weeks to review acid reflux symptoms and progress with cutting down on omeprazole 10 mg Pharmacist f/u telephone call 6 months or sooner if needed  Medication Assistance: None required.  Patient affirms current coverage meets needs.    The patient was given the following information about Chronic Care Management services today, agreed to services, and gave verbal consent: 1. CCM service includes personalized support from designated clinical staff supervised by the primary care provider, including individualized plan of care and coordination with other care providers 2. 24/7 contact phone numbers for assistance for urgent and routine care needs. 3. Service will only be billed when office clinical staff spend 20 minutes or more in a month to coordinate care. 4. Only one practitioner may furnish and bill the service in a calendar month. 5.The patient may stop CCM services at any time (effective at the end of the month) by phone call to the office staff. 6. The patient will be responsible for cost sharing (co-pay) of up to 20% of the service fee (after annual deductible is met). Patient agreed to  services and consent obtained.  The patient verbalized understanding of instructions provided today and agreed to receive a MyChart copy of patient instruction and/or educational materials. Telephone follow up appointment with pharmacy team member scheduled for: See next appointment with "Care Management Staff" under "What's Next" below.

## 2020-09-21 ENCOUNTER — Other Ambulatory Visit: Payer: Self-pay

## 2020-09-21 ENCOUNTER — Ambulatory Visit
Admission: RE | Admit: 2020-09-21 | Discharge: 2020-09-21 | Disposition: A | Discharge status: Home / Self Care | Payer: PPO | Source: Ambulatory Visit | Attending: Family Medicine | Admitting: Family Medicine

## 2020-09-21 DIAGNOSIS — Z1231 Encounter for screening mammogram for malignant neoplasm of breast: Secondary | ICD-10-CM

## 2020-10-14 ENCOUNTER — Telehealth: Payer: Self-pay

## 2020-10-14 NOTE — Chronic Care Management (AMB) (Signed)
    Chronic Care Management Pharmacy Assistant   Name: Kristen Stephenson  MRN: [id number] DOB: November 12, 1950  Reason for Encounter:General Patient Call  Recent office visits:  No visits noted.  Recent consult visits:  No visits noted.  Hospital visits:  None in previous 6 months  Medications: Outpatient Encounter Medications as of 10/14/2020  Medication Sig   Calcium Carbonate-Vit D-Min (CALCIUM 1200 PO) Take by mouth.   nitrofurantoin, macrocrystal-monohydrate, (MACROBID) 100 MG capsule Take 1 capsule (100 mg total) by mouth 2 (two) times daily.   omeprazole (PRILOSEC) 20 MG capsule Take 20 mg by mouth daily.   simvastatin (ZOCOR) 20 MG tablet TAKE 1 TABLET BY MOUTH EVERYDAY AT BEDTIME   No facility-administered encounter medications on file as of 10/14/2020.   Several unsuccessful attempts made to contact patient   Star Rating Drugs: Simvastatin 20mg   last filled 08/17/20  Verndale Badin, CMA

## 2020-11-23 ENCOUNTER — Telehealth: Payer: Self-pay | Admitting: Pharmacist

## 2020-11-23 NOTE — Chronic Care Management (AMB) (Addendum)
    Chronic Care Management Pharmacy Assistant   Name: Kristen Stephenson  MRN: [id number] DOB: May 20, 1950  Reason for Encounter: General Adherence Call    Recent office visits:  None  Recent consult visits:  None  Hospital visits:  None in previous 6 months  Medications: Outpatient Encounter Medications as of 11/23/2020  Medication Sig   Calcium Carbonate-Vit D-Min (CALCIUM 1200 PO) Take by mouth.   nitrofurantoin, macrocrystal-monohydrate, (MACROBID) 100 MG capsule Take 1 capsule (100 mg total) by mouth 2 (two) times daily.   omeprazole (PRILOSEC) 20 MG capsule Take 20 mg by mouth daily.   simvastatin (ZOCOR) 20 MG tablet TAKE 1 TABLET BY MOUTH EVERYDAY AT BEDTIME   No facility-administered encounter medications on file as of 11/23/2020.   Patient Questions: Have you had any problems recently with your health? Patient states she has not had any problems recently with her health.  Have you had any problems with your pharmacy? Patient states she has not had any problems with her pharmacy.  What issues or side effects are you having with your medications? Patient states she has not had any issues or side effects with any of her medications.  What would you like me to pass along to [name], CPP for him to help you with?  Patient states her acid reflux symptoms have improved tremendously. She states she has cut back to Omeprazole 10 mg a day. Patient states she has also started taking Calcium with magnesium and zinc 500 mg tablets (two tablets daily) and 125 mcg of Vitamin D3 daily.  What can we do to take care of you better? Patient did not have any suggestions.  Future Appointments  Date Time Provider [place]  12/09/2020  8:30 AM [facility] None  12/09/2020 10:30 AM [place], Rudell Cobb, MD [facility] None  02/03/2021  9:00 AM [name], MD [facility]  03/28/2021  1:30 PM [facility] CCM PHARMACIST [facility]    Star Rating Drugs: Simvastatin 20 mg  last filled 11/18/2020 90 DS  [name], [place] Pharmacist Assistant [phone]   10 minutes spent in review, coordination, and documentation.  Reviewed by: Beverly Milch, PharmD Clinical Pharmacist [phone]

## 2020-11-30 ENCOUNTER — Encounter: Payer: Self-pay | Admitting: Family Medicine

## 2020-12-02 ENCOUNTER — Encounter: Payer: Self-pay | Admitting: Family Medicine

## 2020-12-09 ENCOUNTER — Encounter: Payer: Self-pay | Admitting: Hematology & Oncology

## 2020-12-09 ENCOUNTER — Inpatient Hospital Stay: Payer: PPO | Attending: Hematology & Oncology

## 2020-12-09 ENCOUNTER — Inpatient Hospital Stay: Payer: PPO | Admitting: Hematology & Oncology

## 2020-12-09 ENCOUNTER — Other Ambulatory Visit: Payer: Self-pay

## 2020-12-09 ENCOUNTER — Telehealth: Payer: Self-pay

## 2020-12-09 VITALS — BP 109/67 | HR 84 | Temp 98.4°F | Resp 18 | Ht 64.17 in | Wt 136.1 lb

## 2020-12-09 DIAGNOSIS — D373 Neoplasm of uncertain behavior of appendix: Secondary | ICD-10-CM | POA: Diagnosis not present

## 2020-12-09 DIAGNOSIS — Z85038 Personal history of other malignant neoplasm of large intestine: Secondary | ICD-10-CM | POA: Diagnosis not present

## 2020-12-09 LAB — CBC WITH DIFFERENTIAL (CANCER CENTER ONLY)
Abs Immature Granulocytes: 0.01 10*3/uL (ref 0.00–0.07)
Basophils Absolute: 0 10*3/uL (ref 0.0–0.1)
Basophils Relative: 1 %
Eosinophils Absolute: 0.1 10*3/uL (ref 0.0–0.5)
Eosinophils Relative: 2 %
HCT: 43.4 % (ref 36.0–46.0)
Hemoglobin: 14.1 g/dL (ref 12.0–15.0)
Immature Granulocytes: 0 %
Lymphocytes Relative: 15 %
Lymphs Abs: 0.8 10*3/uL (ref 0.7–4.0)
MCH: 30.1 pg (ref 26.0–34.0)
MCHC: 32.5 g/dL (ref 30.0–36.0)
MCV: 92.5 fL (ref 80.0–100.0)
Monocytes Absolute: 0.4 10*3/uL (ref 0.1–1.0)
Monocytes Relative: 8 %
Neutro Abs: 4.1 10*3/uL (ref 1.7–7.7)
Neutrophils Relative %: 74 %
Platelet Count: 235 10*3/uL (ref 150–400)
RBC: 4.69 MIL/uL (ref 3.87–5.11)
RDW: 12.4 % (ref 11.5–15.5)
WBC Count: 5.5 10*3/uL (ref 4.0–10.5)
nRBC: 0 % (ref 0.0–0.2)

## 2020-12-09 LAB — CMP (CANCER CENTER ONLY)
ALT: 11 U/L (ref 0–44)
AST: 21 U/L (ref 15–41)
Albumin: 4.5 g/dL (ref 3.5–5.0)
Alkaline Phosphatase: 54 U/L (ref 38–126)
Anion gap: 7 (ref 5–15)
BUN: 21 mg/dL (ref 8–23)
CO2: 31 mmol/L (ref 22–32)
Calcium: 9.9 mg/dL (ref 8.9–10.3)
Chloride: 103 mmol/L (ref 98–111)
Creatinine: 0.8 mg/dL (ref 0.44–1.00)
GFR, Estimated: >60 mL/min (ref >60)
Glucose, Bld: 98 mg/dL (ref 70–99)
Potassium: 4.1 mmol/L (ref 3.5–5.1)
Sodium: 141 mmol/L (ref 135–145)
Total Bilirubin: 1.2 mg/dL (ref 0.3–1.2)
Total Protein: 6.9 g/dL (ref 6.5–8.1)

## 2020-12-09 LAB — LACTATE DEHYDROGENASE: LDH: 183 U/L (ref 98–192)

## 2020-12-09 LAB — CEA (IN HOUSE-CHCC): CEA (CHCC-In House): 1.56 ng/mL (ref 0.00–5.00)

## 2020-12-09 NOTE — Progress Notes (Signed)
Hematology and Oncology Follow Up Visit  Kristen Stephenson [id number] 16-Jun-1950 70 y.o. 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  Kristen Stephenson is seen yearly.  Kristen Stephenson is doing quite nicely.  Kristen Stephenson had a wonderful year.  Kristen Stephenson and Kristen Stephenson husband are getting ready to go to [place].  Apparently, there is a hike that they will be going on.  There will be gone for 3 weeks.  Kristen Stephenson is doing well.  Kristen Stephenson has little bit of discomfort in the right lower quadrant of Kristen Stephenson abdomen.  I suspect this probably is scar tissue.  Kristen Stephenson last CT scan was done a year ago.  The CT scan looked fine.  We will have to repeat another 1.  Kristen Stephenson has had no change in bowel or bladder habits.  There is been no issues with dysuria.  Kristen Stephenson has had no hematuria.  Kristen Stephenson has had no fever.  Kristen Stephenson has avoided the coronavirus.  There is been no rashes.  Kristen Stephenson appetite is good.  Kristen Stephenson has had no nausea or vomiting.  Kristen Stephenson has had no leg swelling.  Overall, I would say Kristen Stephenson performance status is ECOG 0.    Medications:  Current Outpatient Medications:    Calcium Carbonate-Vit D-Min (CALCIUM 1200 PO), Take by mouth., Disp: , Rfl:    nitrofurantoin, macrocrystal-monohydrate, (MACROBID) 100 MG capsule, Take 1 capsule (100 mg total) by mouth 2 (two) times daily., Disp: 10 capsule, Rfl: 0   omeprazole (PRILOSEC) 20 MG capsule, Take 20 mg by mouth daily., Disp: , Rfl:    simvastatin (ZOCOR) 20 MG tablet, TAKE 1 TABLET BY MOUTH EVERYDAY AT BEDTIME, Disp: 90 tablet, Rfl: 3  Allergies: No Known Allergies  Past Medical History, Surgical history, Social history, and Family History were reviewed and updated.  Review of Systems: Review of Systems  Constitutional: Negative.   HENT:  Negative.    Eyes: Negative.   Respiratory: Negative.    Cardiovascular:  Negative.   Gastrointestinal:  Positive for abdominal pain.  Endocrine: Negative.   Genitourinary: Negative.    Musculoskeletal: Negative.   Skin: Negative.   Neurological: Negative.   Hematological: Negative.   Psychiatric/Behavioral: Negative.     Physical Exam:  vitals were not taken for this visit.   Wt Readings from Last 3 Encounters:  02/03/20 143 lb 6.4 oz (65 kg)  12/10/19 143 lb (64.9 kg)  10/14/19 139 lb 6.4 oz (63.2 kg)    Physical Exam Vitals reviewed.  HENT:     Head: Normocephalic and atraumatic.  Eyes:     Pupils: Pupils are equal, round, and reactive to light.  Cardiovascular:     Rate and Rhythm: Normal rate and regular rhythm.     Heart sounds: Normal heart sounds.  Pulmonary:     Effort: Pulmonary effort is normal.     Breath sounds: Normal breath sounds.  Abdominal:     General: Bowel sounds are normal.     Palpations: Abdomen is soft.     Comments: Abdominal exam is soft.  Kristen Stephenson has good bowel sounds.  Kristen Stephenson has well-healed laparoscopy scar in the midline.  Kristen Stephenson has had no fluid wave.  There is no guarding or rebound tenderness.  There is no palpable abdominal mass.  Is no palpable liver or spleen tip.  Musculoskeletal:        General: No tenderness or deformity. Normal range of motion.     Cervical back: Normal range of motion.  Lymphadenopathy:     Cervical: No cervical adenopathy.  Skin:    General: Skin is warm and dry.     Findings: No erythema or rash.  Neurological:     Mental Status: Kristen Stephenson is alert and oriented to person, place, and time.  Psychiatric:        Behavior: Behavior normal.        Thought Content: Thought content normal.        Judgment: Judgment normal.     Lab Results  Component Value Date   WBC 5.5 12/09/2020   HGB 14.1 12/09/2020   HCT 43.4 12/09/2020   MCV 92.5 12/09/2020   PLT 235 12/09/2020     Chemistry      Component Value Date/Time   NA 142 12/10/2019 0850   NA 142 05/21/2018 0000   K 4.4 12/10/2019 0850    CL 104 12/10/2019 0850   CO2 31 12/10/2019 0850   BUN 15 12/10/2019 0850   BUN 19 05/21/2018 0000   CREATININE 0.82 12/10/2019 0850   GLU 101 05/21/2018 0000      Component Value Date/Time   CALCIUM 9.9 12/10/2019 0850   ALKPHOS 55 12/10/2019 0850   AST 17 12/10/2019 0850   ALT 10 12/10/2019 0850   BILITOT 0.6 12/10/2019 0850      Impression and Plan: Kristen Stephenson is a very nice 63 year old white female.  Kristen Stephenson has a very interesting history.  Kristen Stephenson has low-grade mucinous neoplasm of the appendix.  It was not metastatic.  It was locally advanced.  There did not appear to be any adenopathy.  Kristen Stephenson underwent an incredibly aggressive resection followed by the intraperitoneal chemotherapy with mitomycin C.  There is certainly was a very aggressive way of treating this.  Kristen Stephenson was in apparently very good shape to be able to have this type of treatment.  I would like to do another CT scan on Kristen Stephenson.  I really think Kristen Stephenson probably needs scans until next year.  After next year will be 5 years that Kristen Stephenson has had the procedure.  I have no problems with Kristen Stephenson going on Kristen Stephenson hike in [place].  I know Kristen Stephenson will enjoy this.  I did tell Kristen Stephenson to take a baby aspirin.  I think this would help Kristen Stephenson with respect to DVT prevention.  I told Kristen Stephenson to start 2 days before Kristen Stephenson goes on Kristen Stephenson trip.  I would then make sure Kristen Stephenson takes it throughout Kristen Stephenson time in [place] and then stop it the day after Kristen Stephenson gets back to the [place].  Otherwise, I do not see that Kristen Stephenson has to do anything different.  Kristen Stephenson already stays well-hydrated.  It is a lot of fun talking to Kristen Stephenson.  It is so inspiring to see Kristen Stephenson and to know that Kristen Stephenson is so active and is enjoying Kristen Stephenson life.  We will plan to see Kristen Stephenson back in 1 year.   Volanda Napoleon, MD 9/2/20228:33 AM

## 2020-12-21 ENCOUNTER — Telehealth: Payer: Self-pay | Admitting: Pharmacist

## 2020-12-21 NOTE — Chronic Care Management (AMB) (Addendum)
    Chronic Care Management Pharmacy Assistant   Name: Kristen Stephenson  MRN: [id number] DOB: March 07, 1951   Reason for Encounter: General Adherence Call   Recent office visits:  None  Recent consult visits:  12/09/2020 OV (oncology) Volanda Napoleon, MD; stable follow up, no medication changes indicated.  Hospital visits:  None  Medications: Outpatient Encounter Medications as of 12/21/2020  Medication Sig   Calcium Carbonate-Vit D-Min (CALCIUM 1200 PO) Take by mouth.   nitrofurantoin, macrocrystal-monohydrate, (MACROBID) 100 MG capsule Take 1 capsule (100 mg total) by mouth 2 (two) times daily.   omeprazole (PRILOSEC) 20 MG capsule Take 20 mg by mouth daily.   simvastatin (ZOCOR) 20 MG tablet TAKE 1 TABLET BY MOUTH EVERYDAY AT BEDTIME   No facility-administered encounter medications on file as of 12/21/2020.   Patient Questions: Have you had any problems recently with your health? Patient states she has not had any problems recently with her health.  Have you had any problems with your pharmacy? Patient states she has not had any problems recently with her pharmacy.  What issues or side effects are you having with your medications? Patient states she is not currently having any issues or side effects with any of her medications.  What would you like me to pass along to [name], CPP for him to help you with?  Patient does not have anything for me to pass along at this time.  What can we do to take care of you better? Patient did not have any suggestions. She is happy with her current level of care.   Care Gaps: Medicare Annual Wellness: last AWV 04/06/2019 Zoster Vaccines- Shingrix: Completed COVID-19 Vaccination: PNA Vaccination: Completed Influenza Vaccination: Completed Hemoglobin A1C:  Colonoscopy: Every 5 years, Next due on 10/08/2022 Tetanus/DTAP: Next due on 05/15/2026 Dexa Scan: Every 3 years, next due on 09/01/2022 Mammogram: Yearly, Next due on  09/21/2021 HPV Vaccines: Aged Out  Patient scheduled AWV for 02/13/2021 at 1:45 pm.  Future Appointments  Date Time Provider [place]  01/17/2021 10:30 AM [facility]  02/03/2021  9:00 AM [name], MD [facility]  02/13/2021  1:45 PM [facility] HEALTH COACH [facility]  03/28/2021  1:30 PM [facility] CCM PHARMACIST [facility]     Star Rating Drugs: Simvastatin 20 mg last filled 11/18/2020 90 DS  [name], [facility] Pharmacist Assistant [phone]

## 2021-01-13 ENCOUNTER — Telehealth: Payer: Self-pay | Admitting: *Deleted

## 2021-01-13 NOTE — Telephone Encounter (Signed)
Called and lvm to have labs before CT scan - requested callback to confirm

## 2021-01-16 ENCOUNTER — Ambulatory Visit (INDEPENDENT_AMBULATORY_CARE_PROVIDER_SITE_OTHER): Payer: PPO

## 2021-01-16 ENCOUNTER — Ambulatory Visit: Payer: PPO | Admitting: Sports Medicine

## 2021-01-16 ENCOUNTER — Other Ambulatory Visit: Payer: Self-pay

## 2021-01-16 VITALS — BP 118/70 | HR 80 | Ht 64.17 in | Wt 136.0 lb

## 2021-01-16 DIAGNOSIS — M1712 Unilateral primary osteoarthritis, left knee: Secondary | ICD-10-CM

## 2021-01-16 DIAGNOSIS — M25562 Pain in left knee: Secondary | ICD-10-CM | POA: Diagnosis not present

## 2021-01-16 NOTE — Progress Notes (Signed)
Kristen Stephenson Kristen Stephenson Phone: [phone]   Assessment and Plan:     1. Acute pain of left knee 2. Primary osteoarthritis of left knee -Acute on chronic, initial sports medicine visit - Likely an acute flare of chronic osteoarthritis of left knee based on physical exam, HPI, x-ray - Patient elected for CSI at today's visit.  Tolerated well per procedure note below - May restart activity as tolerated - HEP for knee OA provided - X-ray obtained in clinic.  My interpretation: Moderate medial compartment OA with decreased joint space, cortical changes.  Mild cortical changes to patellofemoral joint.  Minimal changes to lateral compartment - DG Knee AP/LAT W/Sunrise Left; Future  Procedure: Knee Joint Injection Side: Left Indication: Acute knee pain  Risks explained and consent was given verbally. The site was cleaned with alcohol prep. A needle was introduced with an anterio-lateral approach. Injection given using 48mL of 1% lidocaine without epinephrine and 83mL of kenalog 40mg /ml. This was well tolerated and resulted in symptomatic relief.  Needle was removed, hemostasis achieved, and post injection instructions were explained.   Pt was advised to call or return to clinic if these symptoms worsen or fail to improve as anticipated.     Pertinent previous records reviewed include none pertinent   Follow Up: In 4 weeks if no improvement or worsening of symptoms.  Could consider HA injection versus formal PT at that time   Subjective:   I, Kristen Stephenson, am serving as a scribe for Dr. Glennon Stephenson  Chief Complaint: Left knee pain   HPI:   01/16/21 Patient is a 70 year old female presenting with left knee pain going on since the 20th of September. Patient was on vacation in [place] and was hiking when she went to turn her foot stayed and her knee turned.  patient had to use the ball of her foot to walk.  Locates pain to medial knee but only when she turns her knee. States today she is feeling good.   Radiates: no Swelling: yes at the time Mechanical symptoms: no Aggravates: turning of the knee, going up and down steps  Treatments tried: brace, tylenol, aleve,    Relevant Historical Information: ACL surgery 2015.  History of cancer currently in remission  Additional pertinent review of systems negative.   Current Outpatient Medications:    Calcium Carbonate-Vit D-Min (CALCIUM 1200 PO), Take by mouth., Disp: , Rfl:    omeprazole (PRILOSEC) 20 MG capsule, Take 20 mg by mouth daily., Disp: , Rfl:    simvastatin (ZOCOR) 20 MG tablet, TAKE 1 TABLET BY MOUTH EVERYDAY AT BEDTIME, Disp: 90 tablet, Rfl: 3   Objective:     Vitals:   01/16/21 1512  BP: 118/70  Pulse: 80  SpO2: 97%  Weight: 136 lb (61.7 kg)  Height: 5' 4.17" (1.63 m)      Body mass index is 23.22 kg/m.    Physical Exam:    General:  awake, alert oriented, no acute distress nontoxic Skin: no suspicious lesions or rashes Neuro:sensation intact, no deficits, strength 5/5 with no deficits, no atrophy, normal muscle tone Psych: No signs of anxiety, depression or other mood disorder  Left knee: No swelling No deformity Neg fluid wave, joint milking ROM Flex 110, Ext 0 TTP medial joint line NTTP over the quad tendon, medial fem condyle, lat fem condyle, patella, plica, patella tendon, tibial tuberostiy, fibular head, posterior fossa, pes anserine bursa, gerdy's  tubercle, lateral jt line Neg anterior and posterior drawer Neg lachman Neg sag sign Negative varus stress Negative valgus stress Negative McMurray Negative Thessaly  Gait normal    Electronically signed by:  Kristen Stephenson D.[facility] 4:08 PM 01/16/21

## 2021-01-16 NOTE — Patient Instructions (Addendum)
Good to see you  Knee injection given in left knee Can take tylenol as needed for pain Home exercises given  See me again as needed if no improvement in 3-4 weeks

## 2021-01-17 ENCOUNTER — Encounter (HOSPITAL_BASED_OUTPATIENT_CLINIC_OR_DEPARTMENT_OTHER): Payer: Self-pay

## 2021-01-17 ENCOUNTER — Inpatient Hospital Stay: Payer: PPO | Attending: Hematology & Oncology

## 2021-01-17 ENCOUNTER — Ambulatory Visit (HOSPITAL_BASED_OUTPATIENT_CLINIC_OR_DEPARTMENT_OTHER)
Admission: RE | Admit: 2021-01-17 | Discharge: 2021-01-17 | Disposition: A | Discharge status: Home / Self Care | Payer: PPO | Source: Ambulatory Visit | Attending: Hematology & Oncology | Admitting: Hematology & Oncology

## 2021-01-17 DIAGNOSIS — K449 Diaphragmatic hernia without obstruction or gangrene: Secondary | ICD-10-CM | POA: Diagnosis not present

## 2021-01-17 DIAGNOSIS — R918 Other nonspecific abnormal finding of lung field: Secondary | ICD-10-CM | POA: Diagnosis not present

## 2021-01-17 DIAGNOSIS — I700 Atherosclerosis of aorta: Secondary | ICD-10-CM | POA: Diagnosis not present

## 2021-01-17 DIAGNOSIS — N281 Cyst of kidney, acquired: Secondary | ICD-10-CM | POA: Diagnosis not present

## 2021-01-17 DIAGNOSIS — D373 Neoplasm of uncertain behavior of appendix: Secondary | ICD-10-CM

## 2021-01-17 DIAGNOSIS — C189 Malignant neoplasm of colon, unspecified: Secondary | ICD-10-CM | POA: Diagnosis not present

## 2021-01-17 LAB — CBC WITH DIFFERENTIAL (CANCER CENTER ONLY)
Abs Immature Granulocytes: 0.04 10*3/uL (ref 0.00–0.07)
Basophils Absolute: 0 10*3/uL (ref 0.0–0.1)
Basophils Relative: 0 %
Eosinophils Absolute: 0 10*3/uL (ref 0.0–0.5)
Eosinophils Relative: 0 %
HCT: 42.6 % (ref 36.0–46.0)
Hemoglobin: 14 g/dL (ref 12.0–15.0)
Immature Granulocytes: 0 %
Lymphocytes Relative: 7 %
Lymphs Abs: 0.7 10*3/uL (ref 0.7–4.0)
MCH: 30.3 pg (ref 26.0–34.0)
MCHC: 32.9 g/dL (ref 30.0–36.0)
MCV: 92.2 fL (ref 80.0–100.0)
Monocytes Absolute: 0.4 10*3/uL (ref 0.1–1.0)
Monocytes Relative: 4 %
Neutro Abs: 9.2 10*3/uL — ABNORMAL HIGH (ref 1.7–7.7)
Neutrophils Relative %: 89 %
Platelet Count: 300 10*3/uL (ref 150–400)
RBC: 4.62 MIL/uL (ref 3.87–5.11)
RDW: 13 % (ref 11.5–15.5)
WBC Count: 10.4 10*3/uL (ref 4.0–10.5)
nRBC: 0 % (ref 0.0–0.2)

## 2021-01-17 LAB — CMP (CANCER CENTER ONLY)
ALT: 24 U/L (ref 0–44)
AST: 24 U/L (ref 15–41)
Albumin: 4.7 g/dL (ref 3.5–5.0)
Alkaline Phosphatase: 42 U/L (ref 38–126)
Anion gap: 7 (ref 5–15)
BUN: 17 mg/dL (ref 8–23)
CO2: 31 mmol/L (ref 22–32)
Calcium: 10.3 mg/dL (ref 8.9–10.3)
Chloride: 103 mmol/L (ref 98–111)
Creatinine: 0.7 mg/dL (ref 0.44–1.00)
GFR, Estimated: >60 mL/min (ref >60)
Glucose, Bld: 135 mg/dL — ABNORMAL HIGH (ref 70–99)
Potassium: 4.2 mmol/L (ref 3.5–5.1)
Sodium: 141 mmol/L (ref 135–145)
Total Bilirubin: 0.4 mg/dL (ref 0.3–1.2)
Total Protein: 6.9 g/dL (ref 6.5–8.1)

## 2021-01-17 LAB — CEA (IN HOUSE-CHCC): CEA (CHCC-In House): 1.32 ng/mL (ref 0.00–5.00)

## 2021-01-17 LAB — LACTATE DEHYDROGENASE: LDH: 212 U/L — ABNORMAL HIGH (ref 98–192)

## 2021-01-17 MED ORDER — IOHEXOL 300 MG/ML  SOLN
100.0000 mL | Freq: Once | INTRAMUSCULAR | Status: AC | PRN
  Administered 2021-01-17: 100 mL via INTRAVENOUS

## 2021-01-18 ENCOUNTER — Telehealth: Payer: Self-pay

## 2021-01-18 NOTE — Telephone Encounter (Signed)
Called and LM informing patient of CY scan results (ok per DPR), requested pt CB with any questions or concerns.

## 2021-01-18 NOTE — Telephone Encounter (Signed)
-----   Message from Volanda Napoleon, MD sent at 01/18/2021  9:04 AM EDT ----- Call - no obvious cancer.  Laurey Arrow

## 2021-02-03 ENCOUNTER — Encounter: Payer: PPO | Admitting: Family Medicine

## 2021-02-13 ENCOUNTER — Ambulatory Visit: Payer: PPO

## 2021-02-16 ENCOUNTER — Other Ambulatory Visit: Payer: Self-pay | Admitting: Family Medicine

## 2021-02-22 ENCOUNTER — Encounter: Payer: Self-pay | Admitting: Sports Medicine

## 2021-02-27 ENCOUNTER — Telehealth: Payer: Self-pay

## 2021-02-27 MED ORDER — SIMVASTATIN 20 MG PO TABS: ORAL_TABLET | ORAL | 0 refills | Status: DC

## 2021-02-27 NOTE — Telephone Encounter (Signed)
MEDICATION: simvastatin (ZOCOR) 20 MG tablet  PHARMACY:  [id number] [place], [place] - [address] Phone:  [phone]  Fax:  [phone]      Comments:   **Let patient know to contact pharmacy at the end of the day to make sure medication is ready. **  ** Please notify patient to allow 48-72 hours to process**  **Encourage patient to contact the pharmacy for refills or they can request refills through [facility]**

## 2021-02-27 NOTE — Telephone Encounter (Signed)
Has been sent in  

## 2021-03-20 NOTE — Progress Notes (Signed)
Chronic Care Management Pharmacy Note  03/29/2021 Name:  Kristen Stephenson MRN:  [id number] DOB:  [birth year]  Recommendations/Changes made from today's visit: No Rx changes  Subjective: Kristen Stephenson is an 70 y.o. year old female who is a primary patient of [name], MD.  The CCM team was consulted for assistance with disease management and care coordination needs.    Engaged with patient by telephone for initial visit in response to provider referral for pharmacy case management and/or care coordination services.   Consent to Services:  The patient was given the following information about Chronic Care Management services today, agreed to services, and gave verbal consent: 1. CCM service includes personalized support from designated clinical staff supervised by the primary care provider, including individualized plan of care and coordination with other care providers 2. 24/7 contact phone numbers for assistance for urgent and routine care needs. 3. Service will only be billed when office clinical staff spend 20 minutes or more in a month to coordinate care. 4. Only one practitioner may furnish and bill the service in a calendar month. 5.The patient may stop CCM services at any time (effective at the end of the month) by phone call to the office staff. 6. The patient will be responsible for cost sharing (co-pay) of up to 20% of the service fee (after annual deductible is met). Patient agreed to services and consent obtained.  Patient Care Team: [name], MD as PCP - General (Family Medicine) Tish Men, MD (Inactive) as Consulting Physician (Hematology) Madelin Rear, [facility] as Pharmacist (Pharmacist)  Objective:  Lab Results  Component Value Date   CREATININE 0.70 01/17/2021   CREATININE 0.80 12/09/2020   CREATININE 0.82 12/10/2019    Lab Results  Component Value Date   HGBA1C 6.1 05/21/2018   Last diabetic Eye exam: No results found for: HMDIABEYEEXA  Last  diabetic Foot exam: No results found for: HMDIABFOOTEX      Component Value Date/Time   CHOL 199 02/03/2020 1011   TRIG 68 02/03/2020 1011   HDL 90 02/03/2020 1011   CHOLHDL 2.2 02/03/2020 1011   VLDL 26.0 04/09/2019 0848   LDLCALC 94 02/03/2020 1011    Hepatic Function Latest Ref Rng & Units 01/17/2021 12/09/2020 12/10/2019  Total Protein 6.5 - 8.1 g/dL 6.9 6.9 6.8  Albumin 3.5 - 5.0 g/dL 4.7 4.5 4.3  AST 15 - 41 U/L _0 ALT 0 - 44 U/L _1 Alk Phosphatase 38 - 126 U/L 42 54 55  Total Bilirubin 0.3 - 1.2 mg/dL 0.4 1.2 0.6    Lab Results  Component Value Date/Time   TSH 3.66 02/03/2020 10:11 AM   TSH 6.48 (H) 05/21/2019 08:01 AM   FREET4 1.1 02/03/2020 10:11 AM   FREET4 0.80 05/21/2019 08:01 AM    CBC Latest Ref Rng & Units 01/17/2021 12/09/2020 12/10/2019  WBC 4.0 - 10.5 K/uL 10.4 5.5 4.3  Hemoglobin 12.0 - 15.0 g/dL 14.0 14.1 13.8  Hematocrit 36.0 - 46.0 % 42.6 43.4 42.6  Platelets 150 - 400 K/uL 300 235 252    No results found for: VD25OH  Clinical ASCVD:  The 10-year ASCVD risk score (Arnett DK, et al., 2019) is: 5.7%   Values used to calculate the score:     Age: 39 years     Sex: Female     Is Non-Hispanic African American: No     Diabetic: No     Tobacco smoker: No  Systolic Blood Pressure: 299 mmHg     Is BP treated: No     HDL Cholesterol: 90 mg/dL     Total Cholesterol: 199 mg/dL    Social History   Tobacco Use  Smoking Status Former   Types: Cigarettes   Quit date: 1993   Years since quitting: 29.9  Smokeless Tobacco Never   BP Readings from Last 3 Encounters:  01/16/21 118/70  12/09/20 109/67  09/16/20 126/77   Pulse Readings from Last 3 Encounters:  01/16/21 80  12/09/20 84  09/16/20 64   Wt Readings from Last 3 Encounters:  01/16/21 136 lb (61.7 kg)  12/09/20 136 lb 1.3 oz (61.7 kg)  02/03/20 143 lb 6.4 oz (65 kg)    Assessment: Review of patient past medical history, allergies, medications, health status, including  review of consultants reports, laboratory and other test data, was performed as part of comprehensive evaluation and provision of chronic care management services.   SDOH:  (Social Determinants of Health) assessments and interventions performed: Yes   CCM Care Plan  No Known Allergies  Medications Reviewed Today     Reviewed by Edythe Clarity, [facility] (Pharmacist) on 03/29/21 at [phone]  Med List Status: <None>   Medication Order Taking? Sig Documenting Provider Last Dose Status Informant  Calcium Carbonate-Vit D-Min (CALCIUM 1200 PO) [id number] Yes Take by mouth. [provider] Taking Active   omeprazole (PRILOSEC) 20 MG capsule [id number] Yes Take 20 mg by mouth daily. [provider] Taking Active   simvastatin (ZOCOR) 20 MG tablet [id number] Yes TAKE 1 TABLET BY MOUTH EVERYDAY AT BEDTIME [name], MD Taking Active             Patient Active Problem List   Diagnosis Date Noted   Postcoital UTI - recurrent 02/03/2020   Pulmonary nodules 09/23/2019   Presbycusis of both ears 09/23/2019   Osteopenia after menopause 09/01/2019   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   Anxiety 09/16/2018   History of ovarian cancer 09/16/2018   Mixed hyperlipidemia 09/16/2018   GERD (gastroesophageal reflux disease) 09/16/2018    Immunization History  Administered Date(s) Administered   Influenza, High Dose Seasonal PF 12/21/2019, 12/08/2020   Influenza-Unspecified 04/28/2014, 05/09/2015, 05/15/2016, 05/20/2017, 05/22/2018, 11/21/2018   PFIZER Comirnaty(Gray Top)Covid-19 Tri-Sucrose Vaccine 07/26/2020   PFIZER(Purple Top)SARS-COV-2 Vaccination 05/24/2019, 06/16/2019, 03/16/2020   Pneumococcal Conjugate-13 10/01/2017   Pneumococcal Polysaccharide-23 11/21/2018   Tdap 05/15/2016   Zoster Recombinat (Shingrix) 09/30/2019, 12/21/2019   Zoster, Live 04/28/2014    Conditions to be addressed/monitored: Osteopenia, HLD, GERD  Care Plan : [place]  Updates made by Edythe Clarity, RPH since 03/29/2021 12:00 AM     Problem: HLD Osteopenia GERD   Priority: High     Long-Range Goal: Disease Management   Start Date: 09/20/2020  Expected End Date: 09/20/2021  Recent Progress: On track  Priority: High  Note:   Current Barriers:  Working towards consolidation/taking less medications every day  Pharmacist Clinical Goal(s):  Patient will contact provider office for questions/concerns as evidenced notation of same in electronic health record through collaboration with PharmD and provider.   Interventions: 1:1 collaboration with [name], MD regarding development and update of comprehensive plan of care as evidenced by provider attestation and co-signature Inter-disciplinary care team collaboration (see longitudinal plan of care) Comprehensive medication review performed; medication list updated in electronic medical record No Rx changes  Hyperlipidemia: (LDL goal < 100) -Controlled -Current treatment: Simvastatin 20 mg  once daily -Medications previously tried: n/a  -Current dietary patterns: minimal process foods. White meat, diary, some chocolate.  -Current exercise habits: active most days with hiking/biking -Educated on Cholesterol goals;  Importance of limiting foods high in cholesterol; -Recommended to continue current medication  Update 03/28/21 Continues to take statin as directed.  Last LDL was controlled - she is due for updated lipid panel. Has upcoming physical - plans to get done at that time. No changes to meds at this time. Continue routine screenings.  Osteopenia (Goal ensure appropriate supplementation) -Controlled -Last DEXA Scan: 08/2019  -Patient is not a candidate for pharmacologic treatment -Current treatment  Calcium-Vitamin D3 supplementation once daily -Recommend [phone] units of vitamin D daily. Recommend 1200 mg of calcium daily from dietary and supplemental sources. Recommend  weight-bearing and muscle strengthening exercises for building and maintaining bone density. -Recommended to continue current medication  GERD (Goal: minimize acid reflux symptoms) -Controlled -Current treatment  Omeprazole 20 mg once daily - taking a half tablet daily -Reviewed acid reflux triggers in detail - will cut out some dairy, lemon juice, chocolate  -Recommended to continue current medication Plan to take half of omeprazole 20 mg tablet (10 mg) once daily for next two weeks, will further taper as appropriate at that point. Supplement with pepcid 10 mg as needed.  Update 03/28/21 She was still taking one-half tablet of omeprazole 73m daily.  She was having some intermittent symptoms intermittently and treating with Tums.  Still working on triggers, however some things she just does not want to give up.  She still does want to come off of the medication all together - currently she is purchasing OTC. Recommended the following taper if she does want to come off: - Every other day x 2 weeks - Then every third day x 1 week. - Will recheck on symptoms at that time, treat any intermittent symptoms with Pepcid OTC. Call with any new or worsening symptoms.    Patient Goals/Self-Care Activities Patient will:  - take medications as prescribed engage in dietary modifications by minimizing acid reflux triggers  Follow Up Plan: PharmD fu in 3 months CMA fu in 30 days to assess GERD and taper off PPI.  Medication Assistance: None required.  Patient affirms current coverage meets needs.        Patient's preferred pharmacy is:  [id number] [place], [address] [address]: [phone]: [phone] [id number] [place] [address]one: [phone]: [phone] Follow Up:  Patient agrees to Care Plan and Follow-up.  Future  Appointments  Date Time Provider [place]1/02/2022  1:00 PM AnLeamon ArntMD [facility]/05[facility]21  1:00 PM [facility] HECornvillePharmD Clinical Pharmacist  [phone]

## 2021-03-28 ENCOUNTER — Ambulatory Visit (INDEPENDENT_AMBULATORY_CARE_PROVIDER_SITE_OTHER): Payer: PPO | Admitting: Pharmacist

## 2021-03-28 DIAGNOSIS — K219 Gastro-esophageal reflux disease without esophagitis: Secondary | ICD-10-CM

## 2021-03-28 DIAGNOSIS — E782 Mixed hyperlipidemia: Secondary | ICD-10-CM

## 2021-03-29 NOTE — Patient Instructions (Addendum)
Visit Information   Goals Addressed             This Visit's Progress    Taper off PPI       Timeframe:  Long-Range Goal Priority:  High Start Date:   03/28/21                          Expected End Date:  09/26/21                     Follow Up Date 06/26/21     - call for medicine refill 2 or 3 days before it runs out - call if I am sick and can't take my medicine - keep a list of all the medicines I take; vitamins and herbals too    Why is this important?   These steps will help you keep on track with your medicines.   Notes:        Patient Care Plan: CCM Pharmacy Care Plan     Problem Identified: HLD Osteopenia GERD   Priority: High     Long-Range Goal: Disease Management   Start Date: 09/20/2020  Expected End Date: 09/20/2021  Recent Progress: On track  Priority: High  Note:   Current Barriers:  Working towards consolidation/taking less medications every day  Pharmacist Clinical Goal(s):  Patient will contact provider office for questions/concerns as evidenced notation of same in electronic health record through collaboration with PharmD and provider.   Interventions: 1:1 collaboration with [name], MD regarding development and update of comprehensive plan of care as evidenced by provider attestation and co-signature Inter-disciplinary care team collaboration (see longitudinal plan of care) Comprehensive medication review performed; medication list updated in electronic medical record No Rx changes  Hyperlipidemia: (LDL goal < 100) -Controlled -Current treatment: Simvastatin 20 mg once daily -Medications previously tried: n/a  -Current dietary patterns: minimal process foods. White meat, diary, some chocolate.  -Current exercise habits: active most days with hiking/biking -Educated on Cholesterol goals;  Importance of limiting foods high in cholesterol; -Recommended to continue current medication  Update 03/28/21 Continues to take statin as  directed.  Last LDL was controlled - she is due for updated lipid panel. Has upcoming physical - plans to get done at that time. No changes to meds at this time. Continue routine screenings.  Osteopenia (Goal ensure appropriate supplementation) -Controlled -Last DEXA Scan: 08/2019  -Patient is not a candidate for pharmacologic treatment -Current treatment  Calcium-Vitamin D3 supplementation once daily -Recommend [phone] units of vitamin D daily. Recommend 1200 mg of calcium daily from dietary and supplemental sources. Recommend weight-bearing and muscle strengthening exercises for building and maintaining bone density. -Recommended to continue current medication  GERD (Goal: minimize acid reflux symptoms) -Controlled -Current treatment  Omeprazole 20 mg once daily - taking a half tablet daily -Reviewed acid reflux triggers in detail - will cut out some dairy, lemon juice, chocolate  -Recommended to continue current medication Plan to take half of omeprazole 20 mg tablet (10 mg) once daily for next two weeks, will further taper as appropriate at that point. Supplement with pepcid 10 mg as needed.  Update 03/28/21 She was still taking one-half tablet of omeprazole 20mg  daily.  She was having some intermittent symptoms intermittently and treating with Tums.  Still working on triggers, however some things she just does not want to give up.  She still does want to come off of the medication all together -  currently she is purchasing OTC. Recommended the following taper if she does want to come off: - Every other day x 2 weeks - Then every third day x 1 week. - Will recheck on symptoms at that time, treat any intermittent symptoms with Pepcid OTC. Call with any new or worsening symptoms.    Patient Goals/Self-Care Activities Patient will:  - take medications as prescribed engage in dietary modifications by minimizing acid reflux triggers  Follow Up Plan: PharmD fu in 3 months CMA fu  in 30 days to assess GERD and taper off PPI.  Medication Assistance: None required.  Patient affirms current coverage meets needs.         Patient verbalizes understanding of instructions provided today and agrees to view in [place].  Telephone follow up appointment with pharmacy team member scheduled for: 3 months  Edythe Clarity, [place], PharmD Clinical Pharmacist  [facility] [phone]

## 2021-04-08 DIAGNOSIS — E782 Mixed hyperlipidemia: Secondary | ICD-10-CM

## 2021-04-19 ENCOUNTER — Other Ambulatory Visit: Payer: Self-pay

## 2021-04-19 ENCOUNTER — Ambulatory Visit (INDEPENDENT_AMBULATORY_CARE_PROVIDER_SITE_OTHER): Payer: PPO | Admitting: Family Medicine

## 2021-04-19 ENCOUNTER — Encounter: Payer: Self-pay | Admitting: Family Medicine

## 2021-04-19 VITALS — BP 112/79 | HR 74 | Temp 98.2°F | Ht 64.0 in | Wt 133.6 lb

## 2021-04-19 DIAGNOSIS — Z0000 Encounter for general adult medical examination without abnormal findings: Secondary | ICD-10-CM | POA: Diagnosis not present

## 2021-04-19 DIAGNOSIS — K219 Gastro-esophageal reflux disease without esophagitis: Secondary | ICD-10-CM | POA: Diagnosis not present

## 2021-04-19 DIAGNOSIS — E782 Mixed hyperlipidemia: Secondary | ICD-10-CM

## 2021-04-19 DIAGNOSIS — K2270 Barrett's esophagus without dysplasia: Secondary | ICD-10-CM | POA: Insufficient documentation

## 2021-04-19 DIAGNOSIS — Z780 Asymptomatic menopausal state: Secondary | ICD-10-CM

## 2021-04-19 DIAGNOSIS — Z8543 Personal history of malignant neoplasm of ovary: Secondary | ICD-10-CM | POA: Diagnosis not present

## 2021-04-19 DIAGNOSIS — M8580 Other specified disorders of bone density and structure, unspecified site: Secondary | ICD-10-CM

## 2021-04-19 NOTE — Progress Notes (Signed)
Subjective  Chief Complaint  Patient presents with   Annual Exam    Fasting    HPI: Kristen Stephenson is a 71 y.o. female who presents to [facility] [place] today for a Female Wellness Visit. She also has the concerns and/or needs as listed above in the chief complaint. These will be addressed in addition to the Health Maintenance Visit.   Wellness Visit: annual visit with health maintenance review and exam without Pap  HM: current screens and imms. Doing great. Hiked 170miles in [place] this summer. Loved it. Keeps active.  Chronic disease f/u and/or acute problem visit: (deemed necessary to be done in addition to the wellness visit): HLD on simvastatin 20 nightly w/o Aes. Fastingfor recheck. Healthy diet H/o cancer: no new sxs.  H/o recurrent UTI: no longer on prophylaxis and doing fine.  GERD on PPI: reviewed GI EGD from [place] 2020: Barretts esophagus w/o dysplasia. Has occ breakthrough sxs. No melena. CRC screen up to date   Assessment  1. Annual physical exam   2. Mixed hyperlipidemia   3. History of ovarian cancer   4. Osteopenia after menopause   5. Postcoital UTI - recurrent   6. Gastroesophageal reflux disease, unspecified whether esophagitis present   7. Barrett's esophagus without dysplasia      Plan  Female Wellness Visit: Age appropriate Health Maintenance and Prevention measures were discussed with patient. Included topics are cancer screening recommendations, ways to keep healthy (see AVS) including dietary and exercise recommendations, regular eye and dental care, use of seat belts, and avoidance of moderate alcohol use and tobacco use.  BMI: discussed patient's BMI and encouraged positive lifestyle modifications to help get to or maintain a target BMI. HM needs and immunizations were addressed and ordered. See below for orders. See HM and immunization section for updates. Routine labs and screening tests ordered including cmp, cbc and lipids  where appropriate. Discussed recommendations regarding Vit D and calcium supplementation (see AVS)  Chronic disease management visit and/or acute problem visit: Refer to GI for f/u on barretts. EGD report and path in media section. Continue PPI, GERD. HLD on statin; sim 20. Recheck today. Needs refill.  Dexa with stable osteopenia; reviewed calcium and D recs; continues to hike and exercise. Recheck next year. Monitor for utis'   Follow up: 12 mo for cpe  Orders Placed This Encounter  Procedures   CBC with Differential/Platelet   Comprehensive metabolic panel   Lipid panel   TSH   T4, free   T3   Ambulatory referral to Gastroenterology   No orders of the defined types were placed in this encounter.     Body mass index is 22.93 kg/m. Wt Readings from Last 3 Encounters:  04/19/21 133 lb 9.6 oz (60.6 kg)  01/16/21 136 lb (61.7 kg)  12/09/20 136 lb 1.3 oz (61.7 kg)     Patient Active Problem List   Diagnosis Date Noted   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    Priority: High   History of ovarian cancer 09/16/2018    Priority: High   Mixed hyperlipidemia 09/16/2018    Priority: High   Osteopenia after menopause 09/01/2019    Priority: Medium     dexa 08/2019    Anxiety 09/16/2018    Priority: Medium    GERD (gastroesophageal reflux disease) 09/16/2018    Priority: Medium    Barrett's esophagus without dysplasia 04/19/2021   Postcoital UTI - recurrent 02/03/2020    Started  macrobid prophylaxis oct 2021 with premarin vaginal cream    Pulmonary nodules 09/23/2019    Stable on fu chest CT 12/2019    Presbycusis of both ears 09/23/2019   Health Maintenance  Topic Date Due   COVID-19 Vaccine (5 - Booster for Pfizer series) 09/20/2020   MAMMOGRAM  09/21/2021   DEXA SCAN  09/01/2022   COLONOSCOPY (Pts 45-55yrs Insurance coverage will need to be confirmed)  10/08/2022   TETANUS/TDAP  05/15/2026   Pneumonia Vaccine 45+ Years old  Completed   INFLUENZA  VACCINE  Completed   Hepatitis C Screening  Completed   Zoster Vaccines- Shingrix  Completed   HPV VACCINES  Aged Out   Immunization History  Administered Date(s) Administered   Influenza, High Dose Seasonal PF 12/21/2019, 12/08/2020   Influenza-Unspecified 04/28/2014, 05/09/2015, 05/15/2016, 05/20/2017, 05/22/2018, 11/21/2018   PFIZER Comirnaty(Gray Top)Covid-19 Tri-Sucrose Vaccine 07/26/2020   PFIZER(Purple Top)SARS-COV-2 Vaccination 05/24/2019, 06/16/2019, 03/16/2020   Pneumococcal Conjugate-13 10/01/2017   Pneumococcal Polysaccharide-23 11/21/2018   Tdap 05/15/2016   Zoster Recombinat (Shingrix) 09/30/2019, 12/21/2019   Zoster, Live 04/28/2014   We updated and reviewed the patient's past history in detail and it is documented below. Allergies: Patient has No Known Allergies. Past Medical History Patient  has a past medical history of Adjustment disorder with anxiety, Anxiety, Barrett's esophagus, H/O ovarian cancer, History of malignant neoplasm of appendix, Hyperlipidemia, Hyperlipidemia, Lung nodule, Osteopenia, Osteopenia after menopause (09/01/2019), and Subclinical hypothyroidism (06/15/2019). Past Surgical History Patient  has a past surgical history that includes Abdominal hysterectomy; Appendectomy (2018); Arthroscopic repair ACL (Left); Femur fracture surgery (Right); Cyst excision (Right, 1972); Tonsillectomy; Ankle surgery (2003); Vulvar lesion removal (07/2007); Inguinal hernia repair (11/2007); and Breast excisional biopsy. Family History: Patient family history includes Asthma in her sister; CAD in her mother; Diabetes in her father and maternal grandmother; Osteoporosis in her mother; Rheum arthritis in her sister. Social History:  Patient  reports that she quit smoking about 30 years ago. Her smoking use included cigarettes. She has never used smokeless tobacco. She reports current alcohol use. She reports that she does not use drugs.  Review of Systems: Constitutional:  negative for fever or malaise Ophthalmic: negative for photophobia, double vision or loss of vision Cardiovascular: negative for chest pain, dyspnea on exertion, or new LE swelling Respiratory: negative for SOB or persistent cough Gastrointestinal: negative for abdominal pain, change in bowel habits or melena Genitourinary: negative for dysuria or gross hematuria, no abnormal uterine bleeding or disharge Musculoskeletal: negative for new gait disturbance or muscular weakness Integumentary: negative for new or persistent rashes, no breast lumps Neurological: negative for TIA or stroke symptoms Psychiatric: negative for SI or delusions Allergic/Immunologic: negative for hives  Patient Care Team    Relationship Specialty Notifications Start End  [name], MD PCP - General Family Medicine  09/23/19   Tish Men, MD (Inactive) Consulting Physician Hematology  04/05/19   Madelin Rear, [facility] Pharmacist Pharmacist  07/26/20    Comment: Phone [phone]    Objective  Vitals: BP 112/79    Pulse 74    Temp 98.2 F (36.8 C) (Temporal)    Ht 5\' 4"  (1.626 m)    Wt 133 lb 9.6 oz (60.6 kg)    SpO2 98%    BMI 22.93 kg/m  General:  Well developed, well nourished, no acute distress  Psych:  Alert and orientedx3,normal mood and affect HEENT:  Normocephalic, atraumatic, non-icteric sclera,  supple neck without adenopathy, mass or thyromegaly Cardiovascular:  Normal S1, S2, RRR  without gallop, rub or murmur Respiratory:  Good breath sounds bilaterally, CTAB with normal respiratory effort Gastrointestinal: normal bowel sounds, soft, non-tender, no noted masses. No HSM MSK: no deformities, contusions. Joints are without erythema or swelling.  Skin:  Warm, no rashes or suspicious lesions noted Neurologic:    Mental status is normal. CN 2-11 are normal. Gross motor and sensory exams are normal. Normal gait. No tremor Breast Exam: No mass, skin retraction or nipple discharge is appreciated in either  breast. No axillary adenopathy. Fibrocystic changes are not noted   Commons side effects, risks, benefits, and alternatives for medications and treatment plan prescribed today were discussed, and the patient expressed understanding of the given instructions. Patient is instructed to call or message via MyChart if he/she has any questions or concerns regarding our treatment plan. No barriers to understanding were identified. We discussed Red Flag symptoms and signs in detail. Patient expressed understanding regarding what to do in case of urgent or emergency type symptoms.  Medication list was reconciled, printed and provided to the patient in AVS. Patient instructions and summary information was reviewed with the patient as documented in the AVS. This note was prepared with assistance of Dragon voice recognition software. Occasional wrong-word or sound-a-like substitutions may have occurred due to the inherent limitations of voice recognition software  This visit occurred during the SARS-CoV-2 public health emergency.  Safety protocols were in place, including screening questions prior to the visit, additional usage of staff PPE, and extensive cleaning of exam room while observing appropriate contact time as indicated for disinfecting solutions.

## 2021-04-19 NOTE — Patient Instructions (Addendum)
Please return in 12 months for your annual complete physical; please come fasting.   I will release your lab results to you on your MyChart account with further instructions. Please reply with any questions.    I have referred you to [facility] to follow up on your upper endoscopy in 2020 that did show Barretts esophagus w/out dysplasia  If you have any questions or concerns, please don't hesitate to send me a message via MyChart or call the office at [phone]. Thank you for visiting with Kristen Stephenson today! It's our pleasure caring for you.   Please do these things to maintain good health!  Exercise at least 30-45 minutes a day,  4-5 days a week.  Eat a low-fat diet with lots of fruits and vegetables, up to 7-9 servings per day. Drink plenty of water daily. Try to drink 8 8oz glasses per day. Seatbelts can save your life. Always wear your seatbelt. Place Smoke Detectors on every level of your home and check batteries every year. Schedule an appointment with an eye doctor for an eye exam every 1-2 years Safe sex - use condoms to protect yourself from STDs if you could be exposed to these types of infections. Use birth control if you do not want to become pregnant and are sexually active. Avoid heavy alcohol use. If you drink, keep it to less than 2 drinks/day and not every day. [place].  Choose someone you trust that could speak for you if you became unable to speak for yourself. Depression is common in our stressful world.If you're feeling down or losing interest in things you normally enjoy, please come in for a visit. If anyone is threatening or hurting you, please get help. Physical or Emotional Violence is never OK.    Barrett's Esophagus Barrett's esophagus occurs when the tissue that lines the esophagus changes or becomes damaged. The esophagus is the tube that carries food from the throat to the stomach. With Barrett's esophagus, the cells that line the esophagus are  replaced by cells that are similar to the lining of the intestines (intestinal metaplasia). Barrett's esophagus itself may not cause any symptoms. However, many people who have Barrett's esophagus also have gastroesophageal reflux disease (GERD), which may cause symptoms such as heartburn. Over time, a few people with this condition may develop cancer of the esophagus. Treatment may include medicines, procedures to destroy the abnormal cells, or surgery. What are the causes? The exact cause of this condition is not known. In some cases, the condition develops from damage to the lining of the esophagus caused by gastroesophageal reflux disease (GERD). GERD occurs when stomach acids flow up from the stomach into the esophagus. Frequent symptoms of GERD may cause intestinal metaplasia or cause cell changes (dysplasia). What increases the risk? You are more likely to develop this condition if you: Have GERD. Are female. Are of European descent. Are obese. Are older than 50. Have a hiatal hernia. This is a condition in which part of your stomach bulges into your chest. Smoke. What are the signs or symptoms? People with Barrett's esophagus often have no symptoms. However, many people with this condition also have GERD. Symptoms of GERD may include: Heartburn. Difficulty swallowing. Dry cough. How is this diagnosed? This condition may be diagnosed based on: Results of an upper gastrointestinal endoscopy. For this exam, a thin, flexible tube with a light and a camera on the end (endoscope) is passed down your esophagus. Your health care provider can  view the inside of your esophagus during this procedure. Results of a biopsy. For this procedure, several tissue samples are removed (biopsy) from your esophagus to look at under a microscope. They are then checked for intestinal metaplasia or dysplasia. How is this treated? Treatment for this condition may include: Medicines (proton pump inhibitors, or  PPIs) to decrease or stop GERD. Periodic endoscopic exams to make sure that cancer is not developing. A procedure or surgery for dysplasia. This may include: Removal or destruction of abnormal cells. Removal of part of the esophagus. Follow these instructions at home: Eating and drinking Eat more fruits and vegetables. Avoid fatty foods. Eat small, frequent meals instead of large meals. Avoid foods that cause heartburn. These foods include: Coffee and alcoholic drinks. Tomatoes and foods made with tomatoes. Greasy or spicy foods. Chocolate and peppermint. Do not drink alcohol. General instructions Take over-the-counter and prescription medicines only as told by your health care provider. Do not use any products that contain nicotine or tobacco, such as cigarettes, e-cigarettes, and chewing tobacco. If you need help quitting, ask your health care provider. If you are being treated for GERD, make sure you take medicines and follow all instructions as told by your health care provider. Keep all follow-up visits as told by your health care provider. This is important. Contact a health care provider if: You have heartburn or GERD symptoms. You have difficulty swallowing. Get help right away if: You have chest pain. You are unable to swallow. You vomit blood or material that looks like coffee grounds. Your stool (feces) is bright red or dark. These symptoms may represent a serious problem that is an emergency. Do not wait to see if the symptoms will go away. Get medical help right away. Call your local emergency services (911 in the [place] Do not drive yourself to the hospital. Summary Barrett's esophagus occurs when the tissue that lines the esophagus changes or becomes damaged. Barrett's esophagus may be diagnosed with an upper gastrointestinal endoscopy and a biopsy. Treatment may include medicines, procedures to remove abnormal cells, or surgery. Follow your health care provider's  instructions about what to eat and drink, what medicines to take, and when to call for help. This information is not intended to replace advice given to you by your health care provider. Make sure you discuss any questions you have with your health care provider. Document Revised: 06/13/2019 Document Reviewed: 06/13/2019 Elsevier Patient Education  [place].

## 2021-04-20 LAB — LIPID PANEL
Cholesterol: 196 mg/dL (ref 0–200)
HDL: 101.7 mg/dL (ref >39.00)
LDL Cholesterol: 84 mg/dL (ref 0–99)
NonHDL: 94.33
Total CHOL/HDL Ratio: 2
Triglycerides: 50 mg/dL (ref 0.0–149.0)
VLDL: 10 mg/dL (ref 0.0–40.0)

## 2021-04-20 LAB — CBC WITH DIFFERENTIAL/PLATELET
Basophils Absolute: 0 10*3/uL (ref 0.0–0.1)
Basophils Relative: 1 % (ref 0.0–3.0)
Eosinophils Absolute: 0.1 10*3/uL (ref 0.0–0.7)
Eosinophils Relative: 2.2 % (ref 0.0–5.0)
HCT: 40.5 % (ref 36.0–46.0)
Hemoglobin: 13.3 g/dL (ref 12.0–15.0)
Lymphocytes Relative: 31.1 % (ref 12.0–46.0)
Lymphs Abs: 1.4 10*3/uL (ref 0.7–4.0)
MCHC: 33 g/dL (ref 30.0–36.0)
MCV: 90.8 fl (ref 78.0–100.0)
Monocytes Absolute: 0.4 10*3/uL (ref 0.1–1.0)
Monocytes Relative: 9.9 % (ref 3.0–12.0)
Neutro Abs: 2.4 10*3/uL (ref 1.4–7.7)
Neutrophils Relative %: 55.8 % (ref 43.0–77.0)
Platelets: 253 10*3/uL (ref 150.0–400.0)
RBC: 4.46 Mil/uL (ref 3.87–5.11)
RDW: 13.1 % (ref 11.5–15.5)
WBC: 4.4 10*3/uL (ref 4.0–10.5)

## 2021-04-20 LAB — COMPREHENSIVE METABOLIC PANEL
ALT: 13 U/L (ref 0–35)
AST: 23 U/L (ref 0–37)
Albumin: 4.5 g/dL (ref 3.5–5.2)
Alkaline Phosphatase: 50 U/L (ref 39–117)
BUN: 16 mg/dL (ref 6–23)
CO2: 31 mEq/L (ref 19–32)
Calcium: 9.9 mg/dL (ref 8.4–10.5)
Chloride: 102 mEq/L (ref 96–112)
Creatinine, Ser: 0.81 mg/dL (ref 0.40–1.20)
GFR: 74.36 mL/min (ref >60.00)
Glucose, Bld: 83 mg/dL (ref 70–99)
Potassium: 4.5 mEq/L (ref 3.5–5.1)
Sodium: 141 mEq/L (ref 135–145)
Total Bilirubin: 0.9 mg/dL (ref 0.2–1.2)
Total Protein: 6.6 g/dL (ref 6.0–8.3)

## 2021-04-20 LAB — T4, FREE: Free T4: 0.95 ng/dL (ref 0.60–1.60)

## 2021-04-20 LAB — T3: T3, Total: 108 ng/dL (ref 76–181)

## 2021-04-20 LAB — TSH: TSH: 2.46 u[IU]/mL (ref 0.35–5.50)

## 2021-04-20 MED ORDER — SIMVASTATIN 20 MG PO TABS
20.0000 mg | ORAL_TABLET | Freq: Every evening | ORAL | 3 refills | Status: DC
Start: 2021-04-20 — End: 2022-04-28

## 2021-04-20 NOTE — Addendum Note (Signed)
Addended by: Billey Chang on: 04/20/2021 04:43 PM   Modules accepted: Orders

## 2021-04-27 ENCOUNTER — Telehealth: Payer: Self-pay | Admitting: Pharmacist

## 2021-04-27 NOTE — Chronic Care Management (AMB) (Addendum)
° ° °  Chronic Care Management Pharmacy Assistant   Name: Kristen Stephenson  MRN: [id number] DOB: May 12, 1950   Reason for Encounter: General Adherence Call    Recent office visits:  04/19/2021 [facility] (PCP) [name], MD; no medication changes indicated.  Recent consult visits:  None  Hospital visits:  None in previous 6 months  Medications: Outpatient Encounter Medications as of 04/27/2021  Medication Sig   Ascorbic Acid (VITAMIN C) 1000 MG tablet Take 1,000 mg by mouth daily.   Calcium Carbonate-Vit D-Min (CALCIUM 1200 PO) Take by mouth.   omeprazole (PRILOSEC) 20 MG capsule Take 20 mg by mouth daily.   simvastatin (ZOCOR) 20 MG tablet Take 1 tablet (20 mg total) by mouth at bedtime. TAKE 1 TABLET BY MOUTH EVERYDAY AT BEDTIME   No facility-administered encounter medications on file as of 04/27/2021.   Patient Questions: Have you had any problems recently with your health? Patient states she has not any problems recently with her health.  Have you had any problems with your pharmacy? Patient states she has not had any problems with her pharmacy.  What issues or side effects are you having with your medications? Patient denies having any issues or side effects with any of her medications.  What would you like me to pass along to [name], CPP for him to help you with?  Patient states she saw Dr. [name] recently and she suggested the patient to stay on Omeprazole due to Barrett's Esophagus.  What can we do to take care of you better? Patient did not have any suggestions.  Care Gaps: Medicare Annual Wellness: Scheduled 05/11/2021 Hemoglobin A1C: 6.1% on 05/21/2018 Colonoscopy: Next due on 10/08/2022 Dexa Scan: Completed 08/28/2019 Mammogram: Completed 09/23/2020  Future Appointments  Date Time Provider [place]  05/11/2021  1:00 PM [facility] HEALTH COACH [facility]  08/07/2021  2:00 PM [facility] CCM PHARMACIST [facility]   Star Rating Drugs: Simvastatin 20 mg last  filled 04/20/2021 90 DS  [name], [place] Pharmacist Assistant [phone]   7 minutes spent in review, coordination, and documentation.  Chart reviewed, reviewed visit with Dr. [name].  Looks like Barrett's Esophagus was just added to problem list.  Definitely a reason to stay of PPI long term  Continue current meds - do not taper off.  Reviewed by: Beverly Milch, PharmD Clinical Pharmacist [phone]

## 2021-04-28 ENCOUNTER — Encounter: Payer: Self-pay | Admitting: Family Medicine

## 2021-05-01 ENCOUNTER — Encounter: Payer: Self-pay | Admitting: Gastroenterology

## 2021-05-02 ENCOUNTER — Telehealth: Payer: Self-pay | Admitting: Family Medicine

## 2021-05-02 NOTE — Telephone Encounter (Signed)
Kristen Stephenson with [facility] is requesting previous GI records. Pt states all records from previous provider in [place] were transferred to our office.

## 2021-05-04 NOTE — Telephone Encounter (Signed)
Kristen Stephenson, Can you check on the GI referral. Need to send  GI the EGD report and path results from her prior office. It is in the media section under amb correspondence dated 11/14/2018 .. the [facility].  Thank you! Dr. [name]

## 2021-05-08 NOTE — Telephone Encounter (Signed)
See note

## 2021-05-11 ENCOUNTER — Ambulatory Visit: Payer: PPO

## 2021-05-16 ENCOUNTER — Encounter: Payer: Self-pay | Admitting: Gastroenterology

## 2021-05-16 ENCOUNTER — Ambulatory Visit: Payer: PPO | Admitting: Gastroenterology

## 2021-05-16 VITALS — BP 120/80 | HR 62 | Ht 64.0 in | Wt 134.0 lb

## 2021-05-16 DIAGNOSIS — K2270 Barrett's esophagus without dysplasia: Secondary | ICD-10-CM

## 2021-05-16 DIAGNOSIS — K219 Gastro-esophageal reflux disease without esophagitis: Secondary | ICD-10-CM | POA: Diagnosis not present

## 2021-05-16 NOTE — Progress Notes (Addendum)
05/16/2021 Kristen Stephenson [id number] 26-Feb-1951   HISTORY OF PRESENT ILLNESS: This is a pleasant 71 year old female who is new to our office.  She has been referred here by Dr. [name] for evaluation regarding a diagnosis of Barrett's esophagus.  She moved here from [place] in 2020.  In October 2019 she had an endoscopy that showed 3 tongues of salmon-colored mucosa from 35 to 37 cm.  This was biopsied in 4 quadrants at intervals of 1.5 cm in the lower third of the esophagus and at the GE junction.  There was a 4 cm hiatal hernia.  Biopsies showed Barrett's esophagus with moderate chronic inflammation negative for dysplasia.  Biopsies of the hernia sac showed predominantly fragments of gastric mucosa with mild chronic inflammation and rare detached fragments of glandular mucosa with intestinal metaplasia, negative for dysplasia.  She is only on omeprazole 20 mg daily.  She says that with that and following some dietary restrictions she feels like her acid reflux is well controlled.  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  That was all removed and treated.  She has had a few colonoscopies, last she thinks maybe 4 to 5 years ago.  We do not have any of her previous colonoscopy records so we will request those.  She does not have any other GI complaints.  She moves her bowels regularly without any rectal bleeding.   Past Medical History:  Diagnosis Date   Adjustment disorder with anxiety    Anxiety    Barrett's esophagus    [facility]Needs Endoscopy in 2023   H/O ovarian cancer    History of malignant neoplasm of appendix    Hyperlipidemia    Hyperlipidemia    Lung nodule    Osteopenia    Osteopenia after menopause 09/01/2019   dexa 08/2019   Subclinical hypothyroidism 06/15/2019   Past Surgical History:  Procedure Laterality Date   ABDOMINAL HYSTERECTOMY     ANKLE SURGERY  2003   APPENDECTOMY  2018    Cancer/S/P resection and chemo   ARTHROSCOPIC REPAIR ACL Left    BREAST EXCISIONAL BIOPSY     CYST EXCISION Right 1972   Right breast/benign   FEMUR FRACTURE SURGERY Right    Plate with 5 screws   INGUINAL HERNIA REPAIR  11/2007   TONSILLECTOMY     VULVAR LESION REMOVAL  07/2007   benign    reports that she quit smoking about 30 years ago. Her smoking use included cigarettes. She has never used smokeless tobacco. She reports current alcohol use. She reports that she does not use drugs. family history includes Asthma in her sister; CAD in her mother; Diabetes in her father and maternal grandmother; Osteoporosis in her mother; Rheum arthritis in her sister. No Known Allergies    Outpatient Encounter Medications as of 05/16/2021  Medication Sig   Calcium Carbonate-Vit D-Min (CALCIUM 1200 PO) Take by mouth.   omeprazole (PRILOSEC) 20 MG capsule Take 20 mg by mouth daily.   simvastatin (ZOCOR) 20 MG tablet Take 1 tablet (20 mg total) by mouth at bedtime. TAKE 1 TABLET BY MOUTH EVERYDAY AT BEDTIME   [DISCONTINUED] Ascorbic Acid (VITAMIN C) 1000 MG tablet Take 1,000 mg by mouth daily.   No facility-administered encounter medications on file as of 05/16/2021.     REVIEW OF SYSTEMS  : All other systems reviewed and negative except where noted in the History of Present Illness.  PHYSICAL EXAM: BP 120/80    Pulse 62    Ht 5\' 4"  (1.626 m)    Wt 134 lb (60.8 kg)    BMI 23.00 kg/m  General: Well developed white female in no acute distress Head: Normocephalic and atraumatic Eyes:  Sclerae anicteric, conjunctiva pink. Ears: Normal auditory acuity Lungs: Clear throughout to auscultation; no W/R/R. Heart: Regular rate and rhythm Abdomen: Soft, non-distended.  BS present.  Non-tender.  Old laparotomy scar noted. Musculoskeletal: Symmetrical with no gross deformities  Skin: No lesions on visible extremities Extremities: No edema  Neurological: Alert oriented x 4, grossly  non-focal Psychological:  Alert and cooperative. Normal mood and affect  ASSESSMENT AND PLAN: *GERD and Barrett's esophagus: This was seen on EGD with biopsies in October 2019.  Has not had a repeat EGD since that time.  We will plan for EGD with Dr. Silverio Decamp.  The risks, benefits, and alternatives to EGD were discussed with the patient and she consents to proceed.  *Previous history of colon polyps: She cannot recall when the last colonoscopy was.  Will request her previous colonoscopy records.  If we get those and she is in fact due for colonoscopy then maybe we can add that on to her EGD procedure.  Continue omeprazole 20 mg daily for now.  **Addendum: Received colonoscopy report from June 2009 at which time she was only found to have hemorrhoids.  Repeat was recommended in 10 years.  She had told me that she had one probably about 4 or 5 years ago so we still need to try to obtain other records.  In regards to EGD, I only have a pathology result from October 2019 that showed Barrett's esophagus with moderate chronic inflammation negative for dysplasia.  We will send these records for scanning.  I did receive another paper showing that she had a colonoscopy in July 2019 that showed diverticulosis and they stated repeat was recommended in 5 years, so July 2024.,  But being that she has had colonoscopy in 2009 and then 2019 and did not have polyps on either of those she is not technically due for another 10-year recall so she should be good until July 2029.  CC:  [name], MD

## 2021-05-16 NOTE — Patient Instructions (Signed)
You have been scheduled for an endoscopy. Please follow written instructions given to you at your visit today. If you use inhalers (even only as needed), please bring them with you on the day of your procedure.  If you are age 69 or older, your body mass index should be between 23-30. Your Body mass index is 23 kg/m. If this is out of the aforementioned range listed, please consider follow up with your Primary Care Provider.  If you are age 78 or younger, your body mass index should be between 19-25. Your Body mass index is 23 kg/m. If this is out of the aformentioned range listed, please consider follow up with your Primary Care Provider.   ________________________________________________________  The [facility] providers would like to encourage you to use [facility] to communicate with providers for non-urgent requests or questions.  Due to long hold times on the telephone, sending your provider a message by [facility] may be a faster and more efficient way to get a response.  Please allow 48 business hours for a response.  Please remember that this is for non-urgent requests.  _______________________________________________________

## 2021-05-17 ENCOUNTER — Telehealth: Payer: Self-pay | Admitting: Family Medicine

## 2021-05-17 ENCOUNTER — Telehealth: Payer: Self-pay | Admitting: Gastroenterology

## 2021-05-17 NOTE — Telephone Encounter (Signed)
The pt was seen in the office I will send to [facility]

## 2021-05-17 NOTE — Telephone Encounter (Signed)
Pt is needing clarification on past tests and procedures. Please call back at [phone]

## 2021-05-17 NOTE — Telephone Encounter (Signed)
Inbound call from patient states she have questions about cost of procedures and prep. Also if the colonoscopy will be added.

## 2021-05-18 NOTE — Telephone Encounter (Signed)
Per past office visit next colonoscopy should be done in 2024 Unable to locate colonoscopy report on chart

## 2021-05-18 NOTE — Telephone Encounter (Signed)
Patient aware, will call preview provider for report

## 2021-05-19 ENCOUNTER — Telehealth: Payer: Self-pay | Admitting: Gastroenterology

## 2021-05-19 NOTE — Telephone Encounter (Signed)
Inbound call from patient requesting to speak with PA.  Has questions about colonoscopy she may need.  Please advise.

## 2021-05-19 NOTE — Telephone Encounter (Signed)
The pt states she has records being faxed to [facility] to determine if she needs to have a colon at this time.  She is scheduled for EGD.  Per the office note [facility] will review records and decide when she is due for colon. She will await a call with a decision on timing of colon

## 2021-05-22 NOTE — Telephone Encounter (Signed)
Was able to find patient previous EGD in Epic. Faxed request for patient's previous colonoscopy to [facility] in [place], [place].

## 2021-05-26 NOTE — Telephone Encounter (Signed)
Received records and placed on your desk for you to review.

## 2021-05-31 ENCOUNTER — Telehealth: Payer: Self-pay | Admitting: *Deleted

## 2021-05-31 NOTE — Progress Notes (Signed)
Reviewed and agree with documentation and assessment and plan. K. Veena Orville Widmann , MD   

## 2021-05-31 NOTE — Telephone Encounter (Signed)
Spoke with patient and informed her we only received 1 of her past colonoscopy. She called her previous GI doctor in [place] and confirmed she had a colonoscopy on 02/03/2018. Will refax records release request that colonoscopy as well.

## 2021-05-31 NOTE — Telephone Encounter (Signed)
-----   Message from Loralie Champagne, PA-C sent at 05/30/2021 12:45 PM EST ----- The only colonoscopy records that I received were from 2009.  She told me that she had one probably about 4 or 5 years ago so we need to try to do some more investigation and work to try to get those records.  Please let the patient know as well.  Thank you,  Jess

## 2021-06-06 NOTE — Telephone Encounter (Signed)
Patient called wanting to see if we had received the other records from her last Gi. Please advise.

## 2021-06-07 NOTE — Telephone Encounter (Signed)
Received records from previous GI again but no colonoscopy report. Spoke with patient and she will call to have them send the colonoscopy report.  ?

## 2021-06-12 ENCOUNTER — Telehealth: Payer: Self-pay

## 2021-06-12 NOTE — Telephone Encounter (Signed)
The pt has been advised and recall has been entered- she states she will call back in August to make the EGD appt.   ?

## 2021-06-12 NOTE — Telephone Encounter (Signed)
Recall has been entered for colon  ? ?Left message on machine to call back  ?

## 2021-06-12 NOTE — Telephone Encounter (Signed)
Received colonoscopy report placed on providers desk.  ?

## 2021-06-12 NOTE — Telephone Encounter (Signed)
Patient called back

## 2021-06-12 NOTE — Telephone Encounter (Signed)
-----   Message from Loralie Champagne, PA-C sent at 06/12/2021 11:24 AM EST ----- ?Please let the patient know that I did receive another paper showing that she had a colonoscopy in July 2019 that showed diverticulosis and they stated repeat was recommended in 5 years, so July 2024.,  But being that she has had colonoscopy in 2009 and then 2019 and did not have polyps on either of those she is not technically due for another 10-year recall so she should be good until July 2029. ? ?Please place colonoscopy recall for 10/2027 with Dr. Silverio Decamp. ? ?She does need EGD only now thought if she would schedule that. ? ?Thank you, ? ?Jess ? ? ?

## 2021-06-28 ENCOUNTER — Encounter: Payer: PPO | Admitting: Gastroenterology

## 2021-07-28 ENCOUNTER — Ambulatory Visit (INDEPENDENT_AMBULATORY_CARE_PROVIDER_SITE_OTHER): Payer: PPO

## 2021-07-28 DIAGNOSIS — Z0000 Encounter for general adult medical examination without abnormal findings: Secondary | ICD-10-CM | POA: Diagnosis not present

## 2021-07-28 NOTE — Patient Instructions (Addendum)
Kristen Stephenson , ?Thank you for taking time to come for your Medicare Wellness Visit. I appreciate your ongoing commitment to your health goals. Please review the following plan we discussed and let me know if I can assist you in the future.  ? ?Screening recommendations/referrals: ?Colonoscopy: Done 10/07/17 repeat every 5/ years   ?Mammogram: Done 09/21/20 repeat every year  ?Bone Density: Done 09/01/19 repeat every 3 years ?Recommended yearly ophthalmology/optometry visit for glaucoma screening and checkup ?Recommended yearly dental visit for hygiene and checkup ? ?Vaccinations: ?Influenza vaccine: Done 12/08/20 repeat every year  ?Pneumococcal vaccine: Up to date ?Tdap vaccine: Done 05/15/16 repeat every 10 years  ?Shingles vaccine: Completed   6/23, 12/21/19 ?Covid-19:Completed 2/14, 3/9, 03/16/20 & 07/26/20 ? ?Advanced directives: Please bring a copy of your health care power of attorney and living will to the office at your convenience. ? ?Conditions/risks identified: Stay Healthy  ? ?Next appointment: Follow up in one year for your annual wellness visit  ? ? ?Preventive Care 3 Years and Older, Female ?Preventive care refers to lifestyle choices and visits with your health care provider that can promote health and wellness. ?What does preventive care include? ?A yearly physical exam. This is also called an annual well check. ?Dental exams once or twice a year. ?Routine eye exams. Ask your health care provider how often you should have your eyes checked. ?Personal lifestyle choices, including: ?Daily care of your teeth and gums. ?Regular physical activity. ?Eating a healthy diet. ?Avoiding tobacco and drug use. ?Limiting alcohol use. ?Practicing safe sex. ?Taking low-dose aspirin every day. ?Taking vitamin and mineral supplements as recommended by your health care provider. ?What happens during an annual well check? ?The services and screenings done by your health care provider during your annual well check will depend on  your age, overall health, lifestyle risk factors, and family history of disease. ?Counseling  ?Your health care provider may ask you questions about your: ?Alcohol use. ?Tobacco use. ?Drug use. ?Emotional well-being. ?Home and relationship well-being. ?Sexual activity. ?Eating habits. ?History of falls. ?Memory and ability to understand (cognition). ?Work and work Statistician. ?Reproductive health. ?Screening  ?You may have the following tests or measurements: ?Height, weight, and BMI. ?Blood pressure. ?Lipid and cholesterol levels. These may be checked every 5 years, or more frequently if you are over 65 years old. ?Skin check. ?Lung cancer screening. You may have this screening every year starting at age 92 if you have a 30-pack-year history of smoking and currently smoke or have quit within the past 15 years. ?Fecal occult blood test (FOBT) of the stool. You may have this test every year starting at age 76. ?Flexible sigmoidoscopy or colonoscopy. You may have a sigmoidoscopy every 5 years or a colonoscopy every 10 years starting at age 27. ?Hepatitis C blood test. ?Hepatitis B blood test. ?Sexually transmitted disease (STD) testing. ?Diabetes screening. This is done by checking your blood sugar (glucose) after you have not eaten for a while (fasting). You may have this done every 1-3 years. ?Bone density scan. This is done to screen for osteoporosis. You may have this done starting at age 44. ?Mammogram. This may be done every 1-2 years. Talk to your health care provider about how often you should have regular mammograms. ?Talk with your health care provider about your test results, treatment options, and if necessary, the need for more tests. ?Vaccines  ?Your health care provider may recommend certain vaccines, such as: ?Influenza vaccine. This is recommended every year. ?Tetanus, diphtheria, and  acellular pertussis (Tdap, Td) vaccine. You may need a Td booster every 10 years. ?Zoster vaccine. You may need this  after age 15. ?Pneumococcal 13-valent conjugate (PCV13) vaccine. One dose is recommended after age 62. ?Pneumococcal polysaccharide (PPSV23) vaccine. One dose is recommended after age 73. ?Talk to your health care provider about which screenings and vaccines you need and how often you need them. ?This information is not intended to replace advice given to you by your health care provider. Make sure you discuss any questions you have with your health care provider. ?Document Released: 04/22/2015 Document Revised: 12/14/2015 Document Reviewed: 01/25/2015 ?Elsevier Interactive Patient Education ? 2017 [employer]. ? ?Fall Prevention in the Home ?Falls can cause injuries. They can happen to people of all ages. There are many things you can do to make your home safe and to help prevent falls. ?What can I do on the outside of my home? ?Regularly fix the edges of walkways and driveways and fix any cracks. ?Remove anything that might make you trip as you walk through a door, such as a raised step or threshold. ?Trim any bushes or trees on the path to your home. ?Use bright outdoor lighting. ?Clear any walking paths of anything that might make someone trip, such as rocks or tools. ?Regularly check to see if handrails are loose or broken. Make sure that both sides of any steps have handrails. ?Any raised decks and porches should have guardrails on the edges. ?Have any leaves, snow, or ice cleared regularly. ?Use sand or salt on walking paths during winter. ?Clean up any spills in your garage right away. This includes oil or grease spills. ?What can I do in the bathroom? ?Use night lights. ?Install grab bars by the toilet and in the tub and shower. Do not use towel bars as grab bars. ?Use non-skid mats or decals in the tub or shower. ?If you need to sit down in the shower, use a plastic, non-slip stool. ?Keep the floor dry. Clean up any water that spills on the floor as soon as it happens. ?Remove soap buildup in the tub or  shower regularly. ?Attach bath mats securely with double-sided non-slip rug tape. ?Do not have throw rugs and other things on the floor that can make you trip. ?What can I do in the bedroom? ?Use night lights. ?Make sure that you have a light by your bed that is easy to reach. ?Do not use any sheets or blankets that are too big for your bed. They should not hang down onto the floor. ?Have a firm chair that has side arms. You can use this for support while you get dressed. ?Do not have throw rugs and other things on the floor that can make you trip. ?What can I do in the kitchen? ?Clean up any spills right away. ?Avoid walking on wet floors. ?Keep items that you use a lot in easy-to-reach places. ?If you need to reach something above you, use a strong step stool that has a grab bar. ?Keep electrical cords out of the way. ?Do not use floor polish or wax that makes floors slippery. If you must use wax, use non-skid floor wax. ?Do not have throw rugs and other things on the floor that can make you trip. ?What can I do with my stairs? ?Do not leave any items on the stairs. ?Make sure that there are handrails on both sides of the stairs and use them. Fix handrails that are broken or loose. Make sure that  handrails are as long as the stairways. ?Check any carpeting to make sure that it is firmly attached to the stairs. Fix any carpet that is loose or worn. ?Avoid having throw rugs at the top or bottom of the stairs. If you do have throw rugs, attach them to the floor with carpet tape. ?Make sure that you have a light switch at the top of the stairs and the bottom of the stairs. If you do not have them, ask someone to add them for you. ?What else can I do to help prevent falls? ?Wear shoes that: ?Do not have high heels. ?Have rubber bottoms. ?Are comfortable and fit you well. ?Are closed at the toe. Do not wear sandals. ?If you use a stepladder: ?Make sure that it is fully opened. Do not climb a closed stepladder. ?Make  sure that both sides of the stepladder are locked into place. ?Ask someone to hold it for you, if possible. ?Clearly mark and make sure that you can see: ?Any grab bars or handrails. ?First and last steps.

## 2021-07-28 NOTE — Progress Notes (Addendum)
Virtual Visit via Telephone Note ? ?I connected with  Kristen Stephenson on 07/28/21 at  3:15 PM EDT by telephone and verified that I am speaking with the correct person using two identifiers. ? ?Medicare Annual Wellness visit completed telephonically due to Covid-19 pandemic.  ? ?Persons participating in this call: This Health Coach and this patient.  ? ?Location: ?Patient: Home  ?Provider: Office  ?  ?I discussed the limitations, risks, security and privacy concerns of performing an evaluation and management service by telephone and the availability of in person appointments. The patient expressed understanding and agreed to proceed. ? ?Unable to perform video visit due to video visit attempted and failed and/or patient does not have video capability.  ? ?Some vital signs may be absent or patient reported.  ? ?Willette Brace, LPN ? ? ?Subjective:  ? Kristen Stephenson is a 71 y.o. female who presents for an Initial Medicare Annual Wellness Visit. ? ?Review of Systems    ?Cardiac Risk Factors include: advanced age (>8mn, >>4women);dyslipidemia ? ?   ?Objective:  ?  ?There were no vitals filed for this visit. ?There is no height or weight on file to calculate BMI. ? ? ?  07/28/2021  ?  3:10 PM 12/09/2020  ?  8:34 AM 12/10/2019  ? 10:40 AM 12/09/2018  ? 11:07 AM  ?Advanced Directives  ?Does Patient Have a Medical Advance Directive? Yes Yes Yes No  ?Type of Advance Directive Living will [place] will Living will;Healthcare Power of Attorney   ?Does patient want to make changes to medical advance directive?  No - Patient declined No - Patient declined   ?Copy of [place]?  No - copy requested    ?Would patient like information on creating a medical advance directive?    No - Patient declined  ? ? ?Current Medications (verified) ?Outpatient Encounter Medications as of 07/28/2021  ?Medication Sig  ? Calcium Carbonate-Vit D-Min (CALCIUM 1200 PO) Take by mouth.  ?  omeprazole (PRILOSEC) 20 MG capsule Take 20 mg by mouth daily.  ? simvastatin (ZOCOR) 20 MG tablet Take 1 tablet (20 mg total) by mouth at bedtime. TAKE 1 TABLET BY MOUTH EVERYDAY AT BEDTIME  ? PFIZER COVID-19 VAC BIVALENT injection   ? ?No facility-administered encounter medications on file as of 07/28/2021.  ? ? ?Allergies (verified) ?Patient has no known allergies.  ? ?History: ?Past Medical History:  ?Diagnosis Date  ? Adjustment disorder with anxiety   ? Anxiety   ? Barrett's esophagus   [facility]  ? H/O ovarian cancer   ? History of malignant neoplasm of appendix   ? Hyperlipidemia   ? Hyperlipidemia   ? Lung nodule   ? Osteopenia   ? Osteopenia after menopause 09/01/2019  ? dexa 08/2019  ? Subclinical hypothyroidism 06/15/2019  ? ?Past Surgical History:  ?Procedure Laterality Date  ? ABDOMINAL HYSTERECTOMY    ? ANKLE SURGERY  2003  ? APPENDECTOMY  2018  ? Cancer/S/P resection and chemo  ? ARTHROSCOPIC REPAIR ACL Left   ? BREAST EXCISIONAL BIOPSY    ? CYST EXCISION Right 1972  ? Right breast/benign  ? FEMUR FRACTURE SURGERY Right   ? Plate with 5 screws  ? INGUINAL HERNIA REPAIR  11/2007  ? TONSILLECTOMY    ? VULVAR LESION REMOVAL  07/2007  ? benign  ? ?Family History  ?Problem Relation Age of Onset  ? CAD Mother   ?  Osteoporosis Mother   ? Diabetes Father   ? Rheum arthritis Sister   ? Asthma Sister   ? Diabetes Maternal Grandmother   ?     amputation  ? ?Social History  ? ?Socioeconomic History  ? Marital status: Married  ?  Spouse name: Not on file  ? Number of children: Not on file  ? Years of education: Not on file  ? Highest education level: Not on file  ?Occupational History  ? Not on file  ?Tobacco Use  ? Smoking status: Former  ?  Types: Cigarettes  ?  Quit date: 48  ?  Years since quitting: 30.3  ? Smokeless tobacco: Never  ?Vaping Use  ? Vaping Use: Never used  ?Substance and Sexual Activity  ? Alcohol use: Yes  ?  Comment: Occas  ? Drug use: Never  ? Sexual  activity: Yes  ?  Partners: Male  ?  Birth control/protection: None, Post-menopausal  ?Other Topics Concern  ? Not on file  ?Social History Narrative  ? Not on file  ? ?Social Determinants of Health  ? ?Financial Resource Strain: Low Risk   ? Difficulty of Paying Living Expenses: Not hard at all  ?Food Insecurity: No Food Insecurity  ? Worried About Charity fundraiser in the Last Year: Never true  ? Ran Out of Food in the Last Year: Never true  ?Transportation Needs: No Transportation Needs  ? Lack of Transportation (Medical): No  ? Lack of Transportation (Non-Medical): No  ?Physical Activity: Sufficiently Active  ? Days of Exercise per Week: 7 days  ? Minutes of Exercise per Session: 120 min  ?Stress: No Stress Concern Present  ? Feeling of Stress : Not at all  ?Social Connections: Socially Integrated  ? Frequency of Communication with Friends and Family: More than three times a week  ? Frequency of Social Gatherings with Friends and Family: More than three times a week  ? Attends Religious Services: More than 4 times per year  ? Active Member of Clubs or Organizations: Yes  ? Attends Archivist Meetings: 1 to 4 times per year  ? Marital Status: Married  ? ? ?Tobacco Counseling ?Counseling given: Not Answered ? ? ?Clinical Intake: ? ?Pre-visit preparation completed: Yes ? ?Pain : No/denies pain ? ?  ? ?BMI - recorded: 23 ?Nutritional Status: BMI of 19-24  Normal ?Nutritional Risks: None ?Diabetes: No ? ?How often do you need to have someone help you when you read instructions, pamphlets, or other written materials from your doctor or pharmacy?: 1 - Never ? ?Diabetic?no ? ?Interpreter Needed?: No ? ?Information entered by :: Charlott Rakes, LPN ? ? ?Activities of Daily Living ? ?  07/28/2021  ?  3:15 PM 04/19/2021  ?  1:11 PM  ?In your present state of health, do you have any difficulty performing the following activities:  ?Hearing? 1 0  ?Comment HOH   ?Vision? 0 0  ?Difficulty concentrating or making  decisions? 0 0  ?Walking or climbing stairs? 0 0  ?Dressing or bathing? 0 0  ?Doing errands, shopping? 0 0  ?Preparing Food and eating ? N   ?Using the Toilet? N   ?In the past six months, have you accidently leaked urine? N   ?Do you have problems with loss of bowel control? N   ?Managing your Medications? N   ?Managing your Finances? N   ?Housekeeping or managing your Housekeeping? N   ? ? ?Patient Care Team: ?[name],  MD as PCP - General (Family Medicine) ?Tish Men, MD (Inactive) as Consulting Physician (Hematology) ?Madelin Rear, [facility] as Pharmacist (Pharmacist) ? ?Indicate any recent Medical Services you may have received from other than [facility] providers in the past year (date may be approximate). ? ?   ?Assessment:  ? This is a routine wellness examination for [facility]. ? ?Hearing/Vision screen ?Hearing Screening - Comments:: Pt stated HOH  ?Vision Screening - Comments:: Pt follows up with [place] end Optician for eye exams and glasses   ? ?Dietary issues and exercise activities discussed: ?Current Exercise Habits: Home exercise routine, Type of exercise: walking;Other - see comments, Time (Minutes): > 60, Frequency (Times/Week): 7, Weekly Exercise (Minutes/Week): 0 ? ? Goals Addressed   ? ?  ?  ?  ?  ? This Visit's Progress  ?  Patient Stated     ?  Stay healthy  ?  ? ?  ? ?Depression Screen ? ?  07/28/2021  ?  3:08 PM 04/19/2021  ?  1:11 PM 02/03/2020  ?  9:33 AM 04/06/2019  ?  8:53 AM 09/16/2018  ?  9:13 AM  ?PHQ 2/9 Scores  ?PHQ - 2 Score 0 0 0 0 0  ?PHQ- 9 Score    1   ?  ?Fall Risk ? ?  07/28/2021  ?  3:14 PM 04/19/2021  ?  1:11 PM 09/23/2019  ? 10:43 AM 09/16/2018  ?  9:13 AM  ?Fall Risk   ?Falls in the past year? 1 0 1 0  ?Number falls in past yr: 1 0 1   ?Injury with Fall? 0 0 1   ?Comment when over trail no injuries     ?Risk for fall due to : Impaired vision     ?Follow up Falls prevention discussed   Falls evaluation completed  ? ? ?FALL RISK PREVENTION PERTAINING TO THE HOME: ? ?Any stairs in or around the  home? No  ?If so, are there any without handrails? No  ?Home free of loose throw rugs in walkways, pet beds, electrical cords, etc? Yes  ?Adequate lighting in your home to reduce risk of falls? Yes  ? ?ASSISTIVE

## 2021-08-07 ENCOUNTER — Telehealth: Payer: PPO

## 2021-08-31 NOTE — Progress Notes (Signed)
Benito Mccreedy D.[place] Phone: [phone]   Assessment and Plan:     1. Primary osteoarthritis of left knee 2. Chronic pain of left knee -Chronic with exacerbation, subsequent visit - Recurrence of left knee pain consistent with acute flare of osteoarthritis with patient receiving 8 months of relief after previous injection on 01/16/2021 and requesting repeat injection - Intra-articular left knee CSI performed in clinic and tolerated well per note below - Restart knee HEP  Procedure: Knee Joint Injection Side: Left Indication: Acute flare of osteoarthritis  Risks explained and consent was given verbally. The site was cleaned with alcohol prep. A needle was introduced with an anterio-lateral approach. Injection given using 37m of 1% lidocaine without epinephrine and 18mof kenalog '40mg'$ /ml. This was well tolerated and resulted in symptomatic relief.  Needle was removed, hemostasis achieved, and post injection instructions were explained.   Pt was advised to call or return to clinic if these symptoms worsen or fail to improve as anticipated.    Pertinent previous records reviewed include none   Follow Up: As needed if no improvement or worsening of symptoms.  Goal of at least 3 months relief from CSI, so could repeat after 12/02/2021 if needed   Subjective:   I, MoPincus Badderam serving as a scEducation administratoror Doctor BeGlennon MacChief Complaint: Left knee pain    HPI:    01/16/21 Patient is a [age]ear old female presenting with left knee pain going on since the 20th of September. [place] her foot stayed and her knee turned.  patient had to use the ball of her foot to walk. Locates pain to medial knee but only when she turns her knee. States today she is feeling good.    Radiates: no Swelling: yes at the time Mechanical symptoms:  no Aggravates: turning of the knee, going up and down steps  Treatments tried: brace, tylenol, aleve,   09/01/2021 Patient states that she is good would like a CSI because its bothering her again     Relevant Historical Information: ACL surgery 2015.  History of cancer currently in remission  Additional pertinent review of systems negative.   Current Outpatient Medications:    Calcium Carbonate-Vit D-Min (CALCIUM 1200 PO), Take by mouth., Disp: , Rfl:    omeprazole (PRILOSEC) 20 MG capsule, Take 20 mg by mouth daily., Disp: , Rfl:    PFIZER COVID-19 VAC BIVALENT injection, , Disp: , Rfl:    simvastatin (ZOCOR) 20 MG tablet, Take 1 tablet (20 mg total) by mouth at bedtime. TAKE 1 TABLET BY MOUTH EVERYDAY AT BEDTIME, Disp: 90 tablet, Rfl: 3   Objective:     Vitals:   09/01/21 1409  BP: 110/80  Pulse: 71  SpO2: 99%  Weight: 139 lb (63 kg)  Height: '5\' 4"'$  (1.626 m)      Body mass index is 23.86 kg/m.    Physical Exam:    General:  awake, alert oriented, no acute distress nontoxic Skin: no suspicious lesions or rashes Neuro:sensation intact, no deficits, strength 5/5 with no deficits, no atrophy, normal muscle tone Psych: No signs of anxiety, depression or other mood disorder   Left knee: No swelling No deformity Neg fluid wave, joint milking ROM Flex 110, Ext 0 TTP medial joint line NTTP over the quad tendon, medial fem condyle, lat fem condyle, patella, plica, patella  tendon, tibial tuberostiy, fibular head, posterior fossa, pes anserine bursa, gerdy's tubercle, lateral jt line Neg anterior and posterior drawer Neg lachman Neg sag sign Negative varus stress Negative valgus stress Negative McMurray Negative Thessaly   Gait normal    Electronically signed by:  Benito Mccreedy D.[facility] 2:15 PM 09/01/21

## 2021-09-01 ENCOUNTER — Ambulatory Visit: Payer: PPO | Admitting: Sports Medicine

## 2021-09-01 VITALS — BP 110/80 | HR 71 | Ht 64.0 in | Wt 139.0 lb

## 2021-09-01 DIAGNOSIS — M1712 Unilateral primary osteoarthritis, left knee: Secondary | ICD-10-CM

## 2021-09-01 DIAGNOSIS — G8929 Other chronic pain: Secondary | ICD-10-CM | POA: Diagnosis not present

## 2021-09-01 DIAGNOSIS — M25562 Pain in left knee: Secondary | ICD-10-CM

## 2021-09-01 NOTE — Patient Instructions (Addendum)
Good to see you  ?As needed follow up  ?

## 2021-09-12 ENCOUNTER — Encounter: Payer: Self-pay | Admitting: Gastroenterology

## 2021-09-27 ENCOUNTER — Telehealth: Payer: Self-pay | Admitting: Gastroenterology

## 2021-09-27 NOTE — Telephone Encounter (Signed)
Inbound call from patient stating she is having a EGD on 8/30 and is wanting to discuss if she can add on a colonoscopy as well. Patient is requesting a call back, please advise.

## 2021-09-27 NOTE — Telephone Encounter (Signed)
Per the note dated 3/6 the pt is not due for a colon until July 2029.  She was interested in  having another now if possible.  She says she has some bloating and stool that she passes when she passes gas.  I advised that she could make an appt with Dr Silverio Decamp to discuss and see what she would recommend.  She declined to make an appt and states she will wait and see how things go and call back if she changes her mind.

## 2021-09-28 ENCOUNTER — Encounter: Payer: Self-pay | Admitting: Family Medicine

## 2021-09-28 ENCOUNTER — Ambulatory Visit (INDEPENDENT_AMBULATORY_CARE_PROVIDER_SITE_OTHER): Payer: PPO | Admitting: Family Medicine

## 2021-09-28 VITALS — BP 120/82 | HR 69 | Temp 98.7°F | Ht 64.0 in | Wt 136.6 lb

## 2021-09-28 DIAGNOSIS — H5713 Ocular pain, bilateral: Secondary | ICD-10-CM | POA: Diagnosis not present

## 2021-09-28 DIAGNOSIS — H18213 Corneal edema secondary to contact lens, bilateral: Secondary | ICD-10-CM

## 2021-09-28 DIAGNOSIS — H43813 Vitreous degeneration, bilateral: Secondary | ICD-10-CM | POA: Diagnosis not present

## 2021-09-28 DIAGNOSIS — M7071 Other bursitis of hip, right hip: Secondary | ICD-10-CM

## 2021-09-28 MED ORDER — TRIAMCINOLONE ACETONIDE 40 MG/ML IJ SUSP: 20.0000 mg | Freq: Once | INTRAMUSCULAR | Status: DC

## 2021-09-29 ENCOUNTER — Telehealth: Payer: Self-pay | Admitting: *Deleted

## 2021-09-29 NOTE — Telephone Encounter (Signed)
Per scheduling message Erie Noe - called and lvm of upcoming appointment - requested call back to confirm. ?

## 2021-10-04 ENCOUNTER — Other Ambulatory Visit: Payer: Self-pay | Admitting: Family

## 2021-10-04 DIAGNOSIS — D373 Neoplasm of uncertain behavior of appendix: Secondary | ICD-10-CM

## 2021-10-06 ENCOUNTER — Telehealth (HOSPITAL_BASED_OUTPATIENT_CLINIC_OR_DEPARTMENT_OTHER): Payer: Self-pay

## 2021-11-15 ENCOUNTER — Ambulatory Visit (AMBULATORY_SURGERY_CENTER): Payer: Self-pay | Admitting: *Deleted

## 2021-11-15 VITALS — Ht 64.0 in | Wt 139.2 lb

## 2021-11-15 DIAGNOSIS — Z8719 Personal history of other diseases of the digestive system: Secondary | ICD-10-CM

## 2021-11-15 NOTE — Progress Notes (Signed)
No egg or soy allergy known to patient  No issues known to pt with past sedation with any surgeries or procedures Patient denies ever being told they had issues or difficulty with intubation  No FH of Malignant Hyperthermia Pt is not on diet pills Pt is not on  home 02  Pt is not on blood thinners  Pt denies issues with constipation  No A fib or A flutter Have any cardiac testing pending--no Pt instructed to use [email or web address].com or GoodRx for a price reduction on prep    Pt.had recent fall on August 1st,fading bruising noted on lower jaw and chin area.

## 2021-11-29 ENCOUNTER — Encounter: Payer: Self-pay | Admitting: Family Medicine

## 2021-11-29 ENCOUNTER — Encounter: Payer: Self-pay | Admitting: Gastroenterology

## 2021-11-29 ENCOUNTER — Ambulatory Visit (INDEPENDENT_AMBULATORY_CARE_PROVIDER_SITE_OTHER): Payer: PPO | Admitting: Family Medicine

## 2021-11-29 VITALS — BP 118/64 | HR 65 | Temp 98.6°F | Ht 64.0 in | Wt 134.4 lb

## 2021-11-29 DIAGNOSIS — G44309 Post-traumatic headache, unspecified, not intractable: Secondary | ICD-10-CM | POA: Diagnosis not present

## 2021-11-29 DIAGNOSIS — K2270 Barrett's esophagus without dysplasia: Secondary | ICD-10-CM

## 2021-11-29 DIAGNOSIS — S060X0A Concussion without loss of consciousness, initial encounter: Secondary | ICD-10-CM

## 2021-11-29 NOTE — Progress Notes (Signed)
Subjective  CC:  Chief Complaint  Patient presents with   Fall    Pt stated that she fell on 11/07/2021 in her back yard and fell backwards and hit her head.     HPI: Kristen Stephenson is a 71 y.o. female who presents to the office today to address the problems listed above in the chief complaint. August 1st, DOI, fell down a 15 foot embankment in her backyard while walking backwards while looking up to see a special moon. She fell backward and rolled all the way down, hitting rocks on the way down. She had bleeding from her mouth and pain at her nose and jaw. She reports she did not lose consciousness. She was able to crawl back up to the top unassisted. Since, her bruises and mouth sores have healed. She denies other injuries, specifically no neck pain. However she has had frontal headaches .Marland Kitchen They were more frequent and worse but now are gradually subsiding. They are relieved by tylenol. No sleep problems, cognitive concerns or neuro deficits.  She has an upper endoscopy scheduled for next week and is very concerned about using sedation given her injury. It is for barrettes surveillance.    Assessment  1. Concussion without loss of consciousness, initial encounter   2. Post-concussion headache   3. Barrett's esophagus without dysplasia      Plan  Concussion with post concussive headaches:  educated and counseled. Fortunately, no severe injuries but likely has had a concussion. Discuss typical course of headaches. They are improving. Recommend brain rest, good nutrition and sleep and avoidance of activities that could lead to new injury (refrain from bicycling for several weeks). Continue with tyelnol as needed. F/u with me if worsens or persist.  Agree, due to her worry and headaches, recommend rescheduling her endoscopy for 6-12 weeks post concussion to ensure she resolves. Pt agrees .  Follow up: as scheduled.  04/23/2022  No orders of the defined types were placed in this  encounter.  No orders of the defined types were placed in this encounter.     I reviewed the patients updated PMH, FH, and SocHx.    Patient Active Problem List   Diagnosis Date Noted   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    Priority: High   History of ovarian cancer 09/16/2018    Priority: High   Mixed hyperlipidemia 09/16/2018    Priority: High   Osteopenia after menopause 09/01/2019    Priority: Medium    Anxiety 09/16/2018    Priority: Medium    GERD (gastroesophageal reflux disease) 09/16/2018    Priority: Medium    Barrett's esophagus without dysplasia 04/19/2021   Postcoital UTI - recurrent 02/03/2020   Pulmonary nodules 09/23/2019   Presbycusis of both ears 09/23/2019   Current Meds  Medication Sig   Calcium Carbonate-Vit D-Min (CALCIUM 1200 PO) Take 600 mg by mouth.   omeprazole (PRILOSEC) 20 MG capsule Take 20 mg by mouth daily.   simvastatin (ZOCOR) 20 MG tablet Take 1 tablet (20 mg total) by mouth at bedtime. TAKE 1 TABLET BY MOUTH EVERYDAY AT BEDTIME   Vitamin E 100 units TABS daily.    Allergies: Patient has No Known Allergies. Family History: Patient family history includes Asthma in her sister; CAD in her mother; Diabetes in her father and maternal grandmother; GER disease in her father; Osteoporosis in her mother; Rheum arthritis in her sister. Social History:  Patient  reports that she quit smoking about 30 years  ago. Her smoking use included cigarettes. She has been exposed to tobacco smoke. She has never used smokeless tobacco. She reports current alcohol use. She reports that she does not use drugs.  Review of Systems: Constitutional: Negative for fever malaise or anorexia Cardiovascular: negative for chest pain Respiratory: negative for SOB or persistent cough Gastrointestinal: negative for abdominal pain  Objective  Vitals: BP 118/64   Pulse 65   Temp 98.6 F (37 C)   Ht '5\' 4"'$  (1.626 m)   Wt 134 lb 6.4 oz (61 kg)   SpO2 96%    BMI 23.07 kg/m  General: no acute distress , A&Ox3 HEENT: PEERL, conjunctiva normal,neck is supple Psych: anxious, normal speech,    Commons side effects, risks, benefits, and alternatives for medications and treatment plan prescribed today were discussed, and the patient expressed understanding of the given instructions. Patient is instructed to call or message via MyChart if he/she has any questions or concerns regarding our treatment plan. No barriers to understanding were identified. We discussed Red Flag symptoms and signs in detail. Patient expressed understanding regarding what to do in case of urgent or emergency type symptoms.  Medication list was reconciled, printed and provided to the patient in AVS. Patient instructions and summary information was reviewed with the patient as documented in the AVS. This note was prepared with assistance of Dragon voice recognition software. Occasional wrong-word or sound-a-like substitutions may have occurred due to the inherent limitations of voice recognition software  This visit occurred during the SARS-CoV-2 public health emergency.  Safety protocols were in place, including screening questions prior to the visit, additional usage of staff PPE, and extensive cleaning of exam room while observing appropriate contact time as indicated for disinfecting solutions.

## 2021-12-06 ENCOUNTER — Encounter: Payer: PPO | Admitting: Gastroenterology

## 2021-12-22 ENCOUNTER — Ambulatory Visit: Payer: PPO | Admitting: Sports Medicine

## 2021-12-26 ENCOUNTER — Other Ambulatory Visit: Payer: Self-pay | Admitting: Sports Medicine

## 2021-12-26 ENCOUNTER — Ambulatory Visit (INDEPENDENT_AMBULATORY_CARE_PROVIDER_SITE_OTHER): Payer: PPO

## 2021-12-26 ENCOUNTER — Ambulatory Visit: Payer: PPO | Admitting: Sports Medicine

## 2021-12-26 VITALS — BP 110/68 | Ht 64.0 in | Wt 134.0 lb

## 2021-12-26 DIAGNOSIS — M25362 Other instability, left knee: Secondary | ICD-10-CM

## 2021-12-26 DIAGNOSIS — G8929 Other chronic pain: Secondary | ICD-10-CM

## 2021-12-26 DIAGNOSIS — M25561 Pain in right knee: Secondary | ICD-10-CM | POA: Diagnosis not present

## 2021-12-26 DIAGNOSIS — M25562 Pain in left knee: Secondary | ICD-10-CM

## 2021-12-26 DIAGNOSIS — M1712 Unilateral primary osteoarthritis, left knee: Secondary | ICD-10-CM

## 2021-12-26 DIAGNOSIS — M1711 Unilateral primary osteoarthritis, right knee: Secondary | ICD-10-CM | POA: Diagnosis not present

## 2021-12-26 MED ORDER — MELOXICAM 15 MG PO TABS: 15.0000 mg | ORAL_TABLET | Freq: Every day | ORAL | 0 refills | Status: DC

## 2021-12-26 NOTE — Addendum Note (Signed)
Addended by: Judy Pimple R on: 12/26/2021 10:10 AM   Modules accepted: Orders

## 2021-12-26 NOTE — Patient Instructions (Addendum)
Good to see you  - Start meloxicam 15 mg daily x2 weeks.  If still having pain after 2 weeks, complete 3rd-week of meloxicam. May use remaining meloxicam as needed once daily for pain control.  Do not to use additional NSAIDs while taking meloxicam.  May use Tylenol [phone] mg 2 to 3 times a day for breakthrough pain. Xray on the way out  MRI left knee  Follow up 3 days after your MRI to discuss your results

## 2021-12-26 NOTE — Progress Notes (Signed)
Kristen Stephenson D.[facility] [place] Phone: [phone]   Assessment and Plan:     1. Primary osteoarthritis of left knee 2. Chronic pain of left knee 3. Acute pain of left knee 4. Knee instability, left -Chronic with exacerbation, subsequent visit - New instability of left knee after a fall with positive Lachman, anterior drawer, pivot shift test and history of ACL replacement.  High concern for new ACL tear.  Because of physical exam, instability we will proceed with MRI of left knee - Patient received mild and temporary relief from intra-articular CSI at previous office visit on 09/01/2021.  Relief had worn off within 2 to 3 weeks - Start meloxicam 15 mg daily x2 weeks.  If still having pain after 2 weeks, complete 3rd-week of meloxicam. May use remaining meloxicam as needed once daily for pain control.  Do not to use additional NSAIDs while taking meloxicam.  May use Tylenol [phone] mg 2 to 3 times a day for breakthrough pain. - Recommend wearing knee brace to prevent falls  5. Acute pain of right knee -Acute, initial visit - Suspect flare of right knee pain primarily due to compensation with left knee instability and left knee pain - X-ray obtained in clinic.  My interpretation: No acute fracture or dislocation.  Mildly decreased medial joint space - Start meloxicam 15 mg daily x2 weeks.  If still having pain after 2 weeks, complete 3rd-week of meloxicam. May use remaining meloxicam as needed once daily for pain control.  Do not to use additional NSAIDs while taking meloxicam.  May use Tylenol [phone] mg 2 to 3 times a day for breakthrough pain.   Other orders - meloxicam (MOBIC) 15 MG tablet; Take 1 tablet (15 mg total) by mouth daily.    Pertinent previous records reviewed include none   Follow Up: 3 days after MRI to review results   Subjective:   I, Kristen Stephenson, am serving as a Education administrator for Doctor Glennon Mac   Chief Complaint: Left knee pain    HPI:    01/16/21 Patient is a 71 year old female presenting with left knee pain going on since the 20th of September. Patient was on vacation in [place] and was hiking when she went to turn her foot stayed and her knee turned.  patient had to use the ball of her foot to walk. Locates pain to medial knee but only when she turns her knee. States today she is feeling good.    Radiates: no Swelling: yes at the time Mechanical symptoms: no Aggravates: turning of the knee, going up and down steps  Treatments tried: brace, tylenol, aleve,    09/01/2021 Patient states that she is good would like a CSI because its bothering her again    12/26/2021 Patient states that her knees are still bothering her she is having compensation pain from antalgic gait , right knee is paining her more    Relevant Historical Information: ACL surgery 2015.  History of cancer currently in remission  Additional pertinent review of systems negative.   Current Outpatient Medications:    Calcium Carbonate-Vit D-Min (CALCIUM 1200 PO), Take 600 mg by mouth., Disp: , Rfl:    meloxicam (MOBIC) 15 MG tablet, Take 1 tablet (15 mg total) by mouth daily., Disp: 30 tablet, Rfl: 0   omeprazole (PRILOSEC) 20 MG capsule, Take 20 mg by mouth daily., Disp: , Rfl:    simvastatin (ZOCOR) 20 MG tablet,  Take 1 tablet (20 mg total) by mouth at bedtime. TAKE 1 TABLET BY MOUTH EVERYDAY AT BEDTIME, Disp: 90 tablet, Rfl: 3   Vitamin E 100 units TABS, daily., Disp: , Rfl:    Objective:     Vitals:   12/26/21 0923  BP: 110/68  Weight: 134 lb (60.8 kg)  Height: '5\' 4"'$  (1.626 m)      Body mass index is 23 kg/m.    Physical Exam:    General:  awake, alert oriented, no acute distress nontoxic Skin: no suspicious lesions or rashes Neuro:sensation intact, no deficits, strength 5/5 with no deficits, no atrophy, normal muscle tone Psych: No signs of anxiety, depression or other mood  disorder  Left knee: Mild to moderate swelling No deformity Positive fluid wave, joint milking ROM Flex 110 , Ext 0  NTTP over the quad tendon, medial fem condyle, lat fem condyle, patella, plica, patella tendon, tibial tuberostiy, fibular head, posterior fossa, pes anserine bursa, gerdy's tubercle, medial jt line, lateral jt line Positive anterior and negative posterior drawer Positive pivot shift and lachman Neg sag sign Negative varus stress Negative valgus stress Negative McMurray Negative Thessaly  Gait normal    Electronically signed by:  Kristen Stephenson D.[facility] 9:56 AM 12/26/21

## 2022-01-01 ENCOUNTER — Encounter: Payer: Self-pay | Admitting: *Deleted

## 2022-01-03 ENCOUNTER — Ambulatory Visit: Payer: PPO | Admitting: Sports Medicine

## 2022-01-03 VITALS — BP 128/82 | HR 51 | Ht 64.0 in | Wt 138.0 lb

## 2022-01-03 DIAGNOSIS — G8929 Other chronic pain: Secondary | ICD-10-CM | POA: Diagnosis not present

## 2022-01-03 DIAGNOSIS — M79652 Pain in left thigh: Secondary | ICD-10-CM

## 2022-01-03 DIAGNOSIS — M25362 Other instability, left knee: Secondary | ICD-10-CM

## 2022-01-03 DIAGNOSIS — M25562 Pain in left knee: Secondary | ICD-10-CM

## 2022-01-03 NOTE — Patient Instructions (Addendum)
Good to see you Recommend non weight bearing use crutches  1 week follow up

## 2022-01-03 NOTE — Progress Notes (Signed)
Kristen Stephenson D.[facility] [employer] Phone: [phone]   Assessment and Plan:     1. Chronic pain of left knee 2. Knee instability, left 3. Left thigh pain  -Chronic with exacerbation, subsequent visit - Patient presented for same-day visit due to concern for worsening left thigh pain progressively over the last 1 week and severe today and patient was concerned for DVT.  I think that likely patient has ruptured her ACL based on continued positive Lachman and anterior drawer on physical exam, and that these episodes of instability are leading to hamstring strains and overuse to try to make up for instability of ruptured ACL. - Low suspicion for DVT based on gradual progression of pain over 1 week, no swelling/discoloration/warmth and thigh, no shortness of breath - MRI scheduled for 01/07/2022.  We will follow-up after this MRI to review results and discuss treatment plan  Pertinent previous records reviewed include none   Follow Up: - MRI scheduled for 01/07/2022.  We will follow-up after this MRI to review results and discuss treatment plan     Subjective:   I, Kristen Stephenson, am serving as a Education administrator for [name]   Chief Complaint: Left knee pain    HPI:    01/16/21 Patient is a 71 year old female presenting with left knee pain going on since the 20th of September. Patient was on vacation in [place] and was hiking when she went to turn her foot stayed and her knee turned.  patient had to use the ball of her foot to walk. Locates pain to medial knee but only when she turns her knee. States today she is feeling good.    Radiates: no Swelling: yes at the time Mechanical symptoms: no Aggravates: turning of the knee, going up and down steps  Treatments tried: brace, tylenol, aleve,    09/01/2021 Patient states that she is good would like a CSI because its bothering her again    12/26/2021 Patient  states that her knees are still bothering her she is having compensation pain from antalgic gait , right knee is paining her more     01/03/2022 Patient states that she is in extreme pain , pain down the IT band , she does have crutches, she was on a plane and thinks she has a DVT    Relevant Historical Information: ACL surgery 2015.  History of cancer currently in remission  Additional pertinent review of systems negative.   Current Outpatient Medications:    Calcium Carbonate-Vit D-Min (CALCIUM 1200 PO), Take 600 mg by mouth., Disp: , Rfl:    meloxicam (MOBIC) 15 MG tablet, Take 1 tablet (15 mg total) by mouth daily., Disp: 30 tablet, Rfl: 0   omeprazole (PRILOSEC) 20 MG capsule, Take 20 mg by mouth daily., Disp: , Rfl:    simvastatin (ZOCOR) 20 MG tablet, Take 1 tablet (20 mg total) by mouth at bedtime. TAKE 1 TABLET BY MOUTH EVERYDAY AT BEDTIME, Disp: 90 tablet, Rfl: 3   Vitamin E 100 units TABS, daily., Disp: , Rfl:    Objective:     Vitals:   01/03/22 1505  BP: 128/82  Pulse: (!) 51  SpO2: 99%  Weight: 138 lb (62.6 kg)  Height: '5\' 4"'$  (1.626 m)      Body mass index is 23.69 kg/m.    Physical Exam:      General:  awake, alert oriented, no acute distress nontoxic Skin: no suspicious  lesions or rashes Neuro:sensation intact, no deficits, strength 5/5 with no deficits, no atrophy, normal muscle tone Psych: No signs of anxiety, depression or other mood disorder   Left knee: Mild to moderate swelling No deformity Positive fluid wave, joint milking ROM Flex 110 , Ext 0  NTTP over the quad tendon, medial fem condyle, lat fem condyle, patella, plica, patella tendon, tibial tuberostiy, fibular head, posterior fossa, pes anserine bursa, gerdy's tubercle, medial jt line, lateral jt line Positive anterior and negative posterior drawer Positive pivot shift and lachman Neg sag sign Negative varus stress Negative valgus stress Negative McMurray Negative Thessaly   Gait  normal    Electronically signed by:  Kristen Stephenson D.[facility] 4:06 PM 01/03/22

## 2022-01-07 ENCOUNTER — Ambulatory Visit
Admission: RE | Admit: 2022-01-07 | Discharge: 2022-01-07 | Disposition: A | Discharge status: Home / Self Care | Payer: PPO | Source: Ambulatory Visit | Attending: Sports Medicine | Admitting: Sports Medicine

## 2022-01-07 DIAGNOSIS — M25562 Pain in left knee: Secondary | ICD-10-CM | POA: Diagnosis not present

## 2022-01-07 DIAGNOSIS — M1712 Unilateral primary osteoarthritis, left knee: Secondary | ICD-10-CM

## 2022-01-07 DIAGNOSIS — M25362 Other instability, left knee: Secondary | ICD-10-CM

## 2022-01-07 DIAGNOSIS — G8929 Other chronic pain: Secondary | ICD-10-CM

## 2022-01-09 NOTE — Progress Notes (Unsigned)
    Kristen Stephenson Phone: [phone]   Assessment and Plan:     There are no diagnoses linked to this encounter.  ***   Pertinent previous records reviewed include ***   Follow Up: ***     Subjective:   I, Kristen Stephenson, am serving as a Education administrator for [name]   Chief Complaint: Left knee pain    HPI:    01/16/21 Patient is a 71 year old female presenting with left knee pain going on since the 20th of September. Patient was on vacation in [place] and was hiking when she went to turn her foot stayed and her knee turned.  patient had to use the ball of her foot to walk. Locates pain to medial knee but only when she turns her knee. States today she is feeling good.    Radiates: no Swelling: yes at the time Mechanical symptoms: no Aggravates: turning of the knee, going up and down steps  Treatments tried: brace, tylenol, aleve,    09/01/2021 Patient states that she is good would like a CSI because its bothering her again    12/26/2021 Patient states that her knees are still bothering her she is having compensation pain from antalgic gait , right knee is paining her more     01/03/2022 Patient states that she is in extreme pain , pain down the IT band , she does have crutches, she was on a plane and thinks she has a DVT    01/10/2022 Patient states    Relevant Historical Information: ACL surgery 2015.  History of cancer currently in remission   Additional pertinent review of systems negative.   Current Outpatient Medications:    Calcium Carbonate-Vit D-Min (CALCIUM 1200 PO), Take 600 mg by mouth., Disp: , Rfl:    meloxicam (MOBIC) 15 MG tablet, Take 1 tablet (15 mg total) by mouth daily., Disp: 30 tablet, Rfl: 0   omeprazole (PRILOSEC) 20 MG capsule, Take 20 mg by mouth daily., Disp: , Rfl:    simvastatin (ZOCOR) 20 MG tablet, Take 1 tablet (20 mg total) by mouth at bedtime.  TAKE 1 TABLET BY MOUTH EVERYDAY AT BEDTIME, Disp: 90 tablet, Rfl: 3   Vitamin E 100 units TABS, daily., Disp: , Rfl:    Objective:     There were no vitals filed for this visit.    There is no height or weight on file to calculate BMI.    Physical Exam:    ***   Electronically signed by:  Kristen Stephenson D.[facility] 1:24 PM 01/09/22

## 2022-01-10 ENCOUNTER — Ambulatory Visit: Payer: PPO | Admitting: Sports Medicine

## 2022-01-10 VITALS — HR 69 | Ht 64.0 in | Wt 138.0 lb

## 2022-01-10 DIAGNOSIS — G8929 Other chronic pain: Secondary | ICD-10-CM | POA: Diagnosis not present

## 2022-01-10 DIAGNOSIS — M1712 Unilateral primary osteoarthritis, left knee: Secondary | ICD-10-CM | POA: Diagnosis not present

## 2022-01-10 DIAGNOSIS — S83512A Sprain of anterior cruciate ligament of left knee, initial encounter: Secondary | ICD-10-CM | POA: Diagnosis not present

## 2022-01-10 DIAGNOSIS — M25362 Other instability, left knee: Secondary | ICD-10-CM

## 2022-01-10 DIAGNOSIS — M79652 Pain in left thigh: Secondary | ICD-10-CM

## 2022-01-10 DIAGNOSIS — M25562 Pain in left knee: Secondary | ICD-10-CM

## 2022-01-10 NOTE — Patient Instructions (Addendum)
Good to see you  PT referral  [facility] 4 week follow up

## 2022-01-11 DIAGNOSIS — M25562 Pain in left knee: Secondary | ICD-10-CM | POA: Diagnosis not present

## 2022-01-18 ENCOUNTER — Ambulatory Visit: Payer: PPO | Admitting: Hematology & Oncology

## 2022-01-18 ENCOUNTER — Other Ambulatory Visit: Payer: PPO

## 2022-01-19 ENCOUNTER — Other Ambulatory Visit: Payer: Self-pay | Admitting: *Deleted

## 2022-01-19 DIAGNOSIS — M25562 Pain in left knee: Secondary | ICD-10-CM | POA: Diagnosis not present

## 2022-01-19 DIAGNOSIS — D373 Neoplasm of uncertain behavior of appendix: Secondary | ICD-10-CM

## 2022-01-22 ENCOUNTER — Encounter: Payer: Self-pay | Admitting: Hematology & Oncology

## 2022-01-22 ENCOUNTER — Other Ambulatory Visit: Payer: PPO

## 2022-01-22 ENCOUNTER — Inpatient Hospital Stay: Payer: PPO | Admitting: Hematology & Oncology

## 2022-01-22 ENCOUNTER — Inpatient Hospital Stay: Payer: PPO | Attending: Hematology & Oncology

## 2022-01-22 ENCOUNTER — Ambulatory Visit (HOSPITAL_BASED_OUTPATIENT_CLINIC_OR_DEPARTMENT_OTHER)
Admission: RE | Admit: 2022-01-22 | Discharge: 2022-01-22 | Disposition: A | Discharge status: Home / Self Care | Payer: PPO | Source: Ambulatory Visit | Attending: Family | Admitting: Family

## 2022-01-22 ENCOUNTER — Other Ambulatory Visit: Payer: Self-pay | Admitting: Sports Medicine

## 2022-01-22 ENCOUNTER — Ambulatory Visit: Payer: PPO | Admitting: Hematology & Oncology

## 2022-01-22 ENCOUNTER — Encounter (HOSPITAL_BASED_OUTPATIENT_CLINIC_OR_DEPARTMENT_OTHER): Payer: Self-pay

## 2022-01-22 ENCOUNTER — Other Ambulatory Visit: Payer: Self-pay

## 2022-01-22 VITALS — BP 116/69 | HR 71 | Temp 97.8°F | Resp 18 | Ht 64.0 in | Wt 139.0 lb

## 2022-01-22 DIAGNOSIS — M25362 Other instability, left knee: Secondary | ICD-10-CM

## 2022-01-22 DIAGNOSIS — Z85038 Personal history of other malignant neoplasm of large intestine: Secondary | ICD-10-CM | POA: Insufficient documentation

## 2022-01-22 DIAGNOSIS — D373 Neoplasm of uncertain behavior of appendix: Secondary | ICD-10-CM

## 2022-01-22 DIAGNOSIS — R911 Solitary pulmonary nodule: Secondary | ICD-10-CM | POA: Diagnosis not present

## 2022-01-22 DIAGNOSIS — M25562 Pain in left knee: Secondary | ICD-10-CM

## 2022-01-22 DIAGNOSIS — M25561 Pain in right knee: Secondary | ICD-10-CM

## 2022-01-22 DIAGNOSIS — M1712 Unilateral primary osteoarthritis, left knee: Secondary | ICD-10-CM

## 2022-01-22 DIAGNOSIS — J984 Other disorders of lung: Secondary | ICD-10-CM | POA: Diagnosis not present

## 2022-01-22 DIAGNOSIS — N2889 Other specified disorders of kidney and ureter: Secondary | ICD-10-CM | POA: Diagnosis not present

## 2022-01-22 DIAGNOSIS — G8929 Other chronic pain: Secondary | ICD-10-CM

## 2022-01-22 LAB — CBC WITH DIFFERENTIAL (CANCER CENTER ONLY)
Abs Immature Granulocytes: 0.01 10*3/uL (ref 0.00–0.07)
Basophils Absolute: 0 10*3/uL (ref 0.0–0.1)
Basophils Relative: 0 %
Eosinophils Absolute: 0.3 10*3/uL (ref 0.0–0.5)
Eosinophils Relative: 5 %
HCT: 42.3 % (ref 36.0–46.0)
Hemoglobin: 13.7 g/dL (ref 12.0–15.0)
Immature Granulocytes: 0 %
Lymphocytes Relative: 14 %
Lymphs Abs: 1 10*3/uL (ref 0.7–4.0)
MCH: 30.2 pg (ref 26.0–34.0)
MCHC: 32.4 g/dL (ref 30.0–36.0)
MCV: 93.4 fL (ref 80.0–100.0)
Monocytes Absolute: 0.7 10*3/uL (ref 0.1–1.0)
Monocytes Relative: 9 %
Neutro Abs: 5.2 10*3/uL (ref 1.7–7.7)
Neutrophils Relative %: 72 %
Platelet Count: 218 10*3/uL (ref 150–400)
RBC: 4.53 MIL/uL (ref 3.87–5.11)
RDW: 12.4 % (ref 11.5–15.5)
WBC Count: 7.2 10*3/uL (ref 4.0–10.5)
nRBC: 0 % (ref 0.0–0.2)

## 2022-01-22 LAB — CMP (CANCER CENTER ONLY)
ALT: 14 U/L (ref 0–44)
AST: 19 U/L (ref 15–41)
Albumin: 4.7 g/dL (ref 3.5–5.0)
Alkaline Phosphatase: 57 U/L (ref 38–126)
Anion gap: 8 (ref 5–15)
BUN: 24 mg/dL — ABNORMAL HIGH (ref 8–23)
CO2: 32 mmol/L (ref 22–32)
Calcium: 9.9 mg/dL (ref 8.9–10.3)
Chloride: 103 mmol/L (ref 98–111)
Creatinine: 0.93 mg/dL (ref 0.44–1.00)
GFR, Estimated: >60 mL/min (ref >60)
Glucose, Bld: 101 mg/dL — ABNORMAL HIGH (ref 70–99)
Potassium: 4.5 mmol/L (ref 3.5–5.1)
Sodium: 143 mmol/L (ref 135–145)
Total Bilirubin: 0.7 mg/dL (ref 0.3–1.2)
Total Protein: 7.1 g/dL (ref 6.5–8.1)

## 2022-01-22 LAB — LACTATE DEHYDROGENASE: LDH: 182 U/L (ref 98–192)

## 2022-01-22 LAB — CEA (ACCESS): CEA (CHCC): 1.53 ng/mL (ref 0.00–5.00)

## 2022-01-22 MED ORDER — IOHEXOL 300 MG/ML  SOLN
100.0000 mL | Freq: Once | INTRAMUSCULAR | Status: AC | PRN
  Administered 2022-01-22: 100 mL via INTRAVENOUS

## 2022-01-22 NOTE — Progress Notes (Signed)
Hematology and Oncology Follow Up Visit  Kristen Stephenson [id number] 01-03-1951 71 y.o. 01/22/2022   Principle Diagnosis:  Stage IIC (T4N0M0) low-grade mucinous neoplasm of the appendix  Current Therapy:   Status post surgery debulking followed by HIPEC --performed up at [place] on 09/2016     Interim History:  Kristen Stephenson is back for follow-up.  We see her every year.  Since her last saw her, she been doing quite well.  She has another grandchild.  This is a grandson.  He was born in August.  Unfortunately, he is out in [place].  Thankfully, her son's family will be visiting her over the holidays.  She had a CT scan that was done today.  The CT scan not show any evidence of recurrence of the appendiceal cancer.  It is now been 5 years that she had her SURGERY AND HIPEC therapy.  She had the CT scan done which did not show any evidence of recurrent disease.  There was noted to be a development of a superior endplate compression deformity at L5.  This may be from where she fell and went down a hill back in August.  She has had no back pain.  She had no change in bowel or bladder habits.  She has had no cough or shortness of breath.  Thankfully, she has not had any problems with COVID.     I think her last mammogram was done back in June of last year.  Hopefully, she has had one this year.  There is been no bleeding.  She has had no rashes.  Is been no leg swelling.  Overall, I would say performance status is ECOG 0.     Medications:  Current Outpatient Medications:    Calcium Carbonate-Vit D-Min (CALCIUM 1200 PO), Take 600 mg by mouth., Disp: , Rfl:    meloxicam (MOBIC) 15 MG tablet, Take 1 tablet (15 mg total) by mouth daily., Disp: 30 tablet, Rfl: 0   omeprazole (PRILOSEC) 20 MG capsule, Take 20 mg by mouth daily., Disp: , Rfl:    simvastatin (ZOCOR) 20 MG tablet, Take 1 tablet (20 mg total) by mouth at bedtime. TAKE 1 TABLET BY MOUTH EVERYDAY AT BEDTIME, Disp: 90 tablet, Rfl: 3    Vitamin E 100 units TABS, daily., Disp: , Rfl:   Allergies: No Known Allergies  Past Medical History, Surgical history, Social history, and Family History were reviewed and updated.  Review of Systems: Review of Systems  Constitutional: Negative.   HENT:  Negative.    Eyes: Negative.   Respiratory: Negative.    Cardiovascular: Negative.   Gastrointestinal:  Positive for abdominal pain.  Endocrine: Negative.   Genitourinary: Negative.    Musculoskeletal: Negative.   Skin: Negative.   Neurological: Negative.   Hematological: Negative.   Psychiatric/Behavioral: Negative.      Physical Exam:  height is '5\' 4"'$  (1.626 m) and weight is 139 lb (63 kg). Her oral temperature is 97.8 F (36.6 C). Her blood pressure is 116/69 and her pulse is 71. Her respiration is 18 and oxygen saturation is 99%.   Wt Readings from Last 3 Encounters:  01/22/22 139 lb (63 kg)  01/10/22 138 lb (62.6 kg)  01/03/22 138 lb (62.6 kg)    Physical Exam Vitals reviewed.  HENT:     Head: Normocephalic and atraumatic.  Eyes:     Pupils: Pupils are equal, round, and reactive to light.  Cardiovascular:     Rate and Rhythm: Normal rate and  regular rhythm.     Heart sounds: Normal heart sounds.  Pulmonary:     Effort: Pulmonary effort is normal.     Breath sounds: Normal breath sounds.  Abdominal:     General: Bowel sounds are normal.     Palpations: Abdomen is soft.     Comments: Abdominal exam is soft.  She has good bowel sounds.  She has well-healed laparoscopy scar in the midline.  She has had no fluid wave.  There is no guarding or rebound tenderness.  There is no palpable abdominal mass.  Is no palpable liver or spleen tip.  Musculoskeletal:        General: No tenderness or deformity. Normal range of motion.     Cervical back: Normal range of motion.  Lymphadenopathy:     Cervical: No cervical adenopathy.  Skin:    General: Skin is warm and dry.     Findings: No erythema or rash.  Neurological:      Mental Status: She is alert and oriented to person, place, and time.  Psychiatric:        Behavior: Behavior normal.        Thought Content: Thought content normal.        Judgment: Judgment normal.     Lab Results  Component Value Date   WBC 7.2 01/22/2022   HGB 13.7 01/22/2022   HCT 42.3 01/22/2022   MCV 93.4 01/22/2022   PLT 218 01/22/2022     Chemistry      Component Value Date/Time   NA 143 01/22/2022 0832   NA 142 05/21/2018 0000   K 4.5 01/22/2022 0832   CL 103 01/22/2022 0832   CO2 32 01/22/2022 0832   BUN 24 (H) 01/22/2022 0832   BUN 19 05/21/2018 0000   CREATININE 0.93 01/22/2022 0832   GLU 101 05/21/2018 0000      Component Value Date/Time   CALCIUM 9.9 01/22/2022 0832   ALKPHOS 57 01/22/2022 0832   AST 19 01/22/2022 0832   ALT 14 01/22/2022 0832   BILITOT 0.7 01/22/2022 0832      Impression and Plan: Kristen Stephenson is a very nice 69 year old white female.  She has a very interesting history.  She has low-grade mucinous neoplasm of the appendix.  It was not metastatic.  It was locally advanced.  There did not appear to be any adenopathy.  She underwent an incredibly aggressive resection followed by the intraperitoneal chemotherapy with mitomycin C.  There is certainly was a very aggressive way of treating this.  She was in apparently very good shape to be able to have this type of treatment.  It has now been 5 years.  I think that everything looks fine.  I do not think we have to do any scans on her.  I still think we have to follow her yearly.  I am happy that she has a new grandchild.  I know she is looking forward to the holidays when everybody will come to visit.     Volanda Napoleon, MD 10/16/202310:24 AM

## 2022-01-23 ENCOUNTER — Other Ambulatory Visit: Payer: Self-pay | Admitting: Family Medicine

## 2022-01-23 DIAGNOSIS — Z1231 Encounter for screening mammogram for malignant neoplasm of breast: Secondary | ICD-10-CM

## 2022-01-24 DIAGNOSIS — M25562 Pain in left knee: Secondary | ICD-10-CM | POA: Diagnosis not present

## 2022-01-25 ENCOUNTER — Telehealth: Payer: Self-pay | Admitting: *Deleted

## 2022-01-25 NOTE — Telephone Encounter (Signed)
Per 01/22/22 los - called and gave upcoming appointments - confirmed

## 2022-01-25 NOTE — Addendum Note (Signed)
Addended by: Burney Gauze R on: 01/25/2022 01:19 PM   Modules accepted: Orders

## 2022-02-01 DIAGNOSIS — M25562 Pain in left knee: Secondary | ICD-10-CM | POA: Diagnosis not present

## 2022-02-06 NOTE — Progress Notes (Unsigned)
    Kristen Stephenson D.[facility] Phone: [phone]   Assessment and Plan:     There are no diagnoses linked to this encounter.  ***   Pertinent previous records reviewed include ***   Follow Up: ***     Subjective:   I, Kristen Stephenson, am serving as a Education administrator for Doctor Glennon Mac   Chief Complaint: Left knee pain    HPI:    01/16/21 Patient is a 71 year old female presenting with left knee pain going on since the 20th of September. Patient was on vacation in [place] and was hiking when she went to turn her foot stayed and her knee turned.  patient had to use the ball of her foot to walk. Locates pain to medial knee but only when she turns her knee. States today she is feeling good.    Radiates: no Swelling: yes at the time Mechanical symptoms: no Aggravates: turning of the knee, going up and down steps  Treatments tried: brace, tylenol, aleve,    09/01/2021 Patient states that she is good would like a CSI because its bothering her again    12/26/2021 Patient states that her knees are still bothering her she is having compensation pain from antalgic gait , right knee is paining her more     01/03/2022 Patient states that she is in extreme pain , pain down the IT band , she does have crutches, she was on a plane and thinks she has a DVT    01/10/2022 Patient states knee feels fine its the IT band/ hamstring pain   02/07/2022 Patient states   Relevant Historical Information: ACL surgery 2015.  History of cancer currently in remission  Additional pertinent review of systems negative.   Current Outpatient Medications:    Calcium Carbonate-Vit D-Min (CALCIUM 1200 PO), Take 600 mg by mouth., Disp: , Rfl:    meloxicam (MOBIC) 15 MG tablet, Take 1 tablet (15 mg total) by mouth daily., Disp: 30 tablet, Rfl: 0   omeprazole (PRILOSEC) 20 MG capsule, Take 20 mg by mouth daily., Disp: , Rfl:     simvastatin (ZOCOR) 20 MG tablet, Take 1 tablet (20 mg total) by mouth at bedtime. TAKE 1 TABLET BY MOUTH EVERYDAY AT BEDTIME, Disp: 90 tablet, Rfl: 3   Vitamin E 100 units TABS, daily., Disp: , Rfl:    Objective:     There were no vitals filed for this visit.    There is no height or weight on file to calculate BMI.    Physical Exam:    ***   Electronically signed by:  Kristen Stephenson D.[facility] 12:42 PM 02/06/22

## 2022-02-07 ENCOUNTER — Ambulatory Visit: Payer: PPO | Admitting: Sports Medicine

## 2022-02-07 VITALS — BP 118/78 | HR 62 | Ht 64.0 in | Wt 142.0 lb

## 2022-02-07 DIAGNOSIS — M25562 Pain in left knee: Secondary | ICD-10-CM | POA: Diagnosis not present

## 2022-02-07 DIAGNOSIS — G8929 Other chronic pain: Secondary | ICD-10-CM | POA: Diagnosis not present

## 2022-02-07 DIAGNOSIS — S83512D Sprain of anterior cruciate ligament of left knee, subsequent encounter: Secondary | ICD-10-CM | POA: Diagnosis not present

## 2022-02-07 NOTE — Patient Instructions (Addendum)
Good to see you Orthopedic surgery referral  As needed follow up  

## 2022-02-08 DIAGNOSIS — M25562 Pain in left knee: Secondary | ICD-10-CM | POA: Diagnosis not present

## 2022-02-15 DIAGNOSIS — M25562 Pain in left knee: Secondary | ICD-10-CM | POA: Diagnosis not present

## 2022-02-21 ENCOUNTER — Ambulatory Visit: Payer: PPO | Admitting: Orthopaedic Surgery

## 2022-02-26 ENCOUNTER — Ambulatory Visit (INDEPENDENT_AMBULATORY_CARE_PROVIDER_SITE_OTHER): Payer: PPO | Admitting: Orthopaedic Surgery

## 2022-02-26 ENCOUNTER — Ambulatory Visit (INDEPENDENT_AMBULATORY_CARE_PROVIDER_SITE_OTHER): Payer: PPO

## 2022-02-26 ENCOUNTER — Encounter: Payer: Self-pay | Admitting: Orthopaedic Surgery

## 2022-02-26 DIAGNOSIS — G8929 Other chronic pain: Secondary | ICD-10-CM

## 2022-02-26 DIAGNOSIS — M25562 Pain in left knee: Secondary | ICD-10-CM | POA: Diagnosis not present

## 2022-02-26 DIAGNOSIS — M1712 Unilateral primary osteoarthritis, left knee: Secondary | ICD-10-CM | POA: Diagnosis not present

## 2022-02-26 NOTE — Progress Notes (Signed)
Office Visit Note   Patient: Kristen Stephenson           Date of Birth: 05-20-1950           MRN: [id number] Visit Date: 02/26/2022              Requested by: Glennon Mac, [place],  [place] [id number] PCP: [name], MD   Assessment & Plan: Visit Diagnoses:  1. Chronic pain of left knee   2. Unilateral primary osteoarthritis, left knee     Plan: The only surgical option for her at this point would be a knee replacement given the areas of full-thickness cartilage loss of her knee combined with the instability.  I did show her knee replacement model and explained in detail the rationale behind this decision and recommendation.  She has failed conservative treatment for many months now and given their ACL rupture combined with a full-thickness cartilage loss, a knee replacement will stabilize her knee.  She is already been through physical therapy as well as activity modification.  She remains very active at 9 and wants to continue hiking.  We talked about what to expect from an intraoperative and postoperative standpoint and described the risk and benefits of the surgery.  She is interested in having this scheduled.  All questions and concerns were answered and addressed.  Follow-Up Instructions: Return for 2 weeks post-op.   Orders:  Orders Placed This Encounter  Procedures   XR Knee 1-2 Views Left   No orders of the defined types were placed in this encounter.     Procedures: No procedures performed   Clinical Data: No additional findings.   Subjective: Chief Complaint  Patient presents with   Left Knee - Pain  The patient is sent to me from Dr. Glennon Mac to evaluate and treat an ACL deficient left knee.  She x-rays remote history of an ACL reconstruction over 10 years ago.  She is an avid hiker with her husband.  She has been to physical therapy now for that knee and still has had several falls.  Even in August she fell and had a  concussion.  Her right knee has been stable to the left knee is just gotten worse for her.  Recent MRI was obtained of that left knee showing a complete tear of her previous ACL reconstruction but also areas of full-thickness cartilage loss at the medial compartment and patellofemoral joint.  At this point her knee is unstable on a daily basis and is having pain that is detrimentally affecting her mobility, her quality of life and her actives daily living.  She is a thin individual and not a diabetic.  HPI  Review of Systems There is currently listed no headache, chest pain, shortness of breath, fever, chills, nausea, vomiting  Objective: Vital Signs: There were no vitals taken for this visit.  Physical Exam She is alert and orient x3 and in no acute distress Ortho Exam Examination of her left knee shows it is ligamentously lax and significantly unstable.  There is medial joint line tenderness and patellofemoral crepitation.  The range of motion is full and normal. Specialty Comments:  No specialty comments available.  Imaging: XR Knee 1-2 Views Left  Result Date: 02/26/2022 2 views of the left knee show tricompartment arthritis with slight varus malalignment.  There is medial joint space narrowing with bone-on-bone and patellofemoral narrowing.    PMFS History: Patient Active Problem List   Diagnosis  Date Noted   Unilateral primary osteoarthritis, left knee 02/26/2022   Barrett's esophagus without dysplasia 04/19/2021   Postcoital UTI - recurrent 02/03/2020   Pulmonary nodules 09/23/2019   Presbycusis of both ears 09/23/2019   Osteopenia after menopause 09/01/2019   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   Anxiety 09/16/2018   History of ovarian cancer 09/16/2018   Mixed hyperlipidemia 09/16/2018   GERD (gastroesophageal reflux disease) 09/16/2018   Past Medical History:  Diagnosis Date   Adjustment disorder with anxiety    Anxiety    Arthritis    knee,left    Barrett's esophagus    [facility] Provider Based Lic Grp-Needs Endoscopy in 2023   Cancer ([place])    cervix   Cataract    beginning   GERD (gastroesophageal reflux disease)    H/O ovarian cancer    Heart murmur    History of malignant neoplasm of appendix    Hyperlipidemia    Hyperlipidemia    Lung nodule    Osteopenia    Osteopenia after menopause 09/01/2019   dexa 08/2019   Subclinical hypothyroidism 06/15/2019    Family History  Problem Relation Age of Onset   CAD Mother    Osteoporosis Mother    Diabetes Father    GER disease Father    Rheum arthritis Sister    Asthma Sister    Diabetes Maternal Grandmother        amputation   Colon cancer Neg Hx    Colon polyps Neg Hx    Crohn's disease Neg Hx    Esophageal cancer Neg Hx    Rectal cancer Neg Hx    Stomach cancer Neg Hx    Ulcerative colitis Neg Hx     Past Surgical History:  Procedure Laterality Date   ABDOMINAL HYSTERECTOMY     ANKLE SURGERY  2003   APPENDECTOMY  2018   Cancer/S/P resection and chemo   ARTHROSCOPIC REPAIR ACL Left    BREAST EXCISIONAL BIOPSY     CYST EXCISION Right 1972   Right breast/benign   FEMUR FRACTURE SURGERY Right    Plate with 5 screws   INGUINAL HERNIA REPAIR  11/2007   TONSILLECTOMY     VULVAR LESION REMOVAL  07/2007   benign   Social History   Occupational History   Not on file  Tobacco Use   Smoking status: Former    Types: Cigarettes    Quit date: 1993    Years since quitting: 30.9    Passive exposure: Past   Smokeless tobacco: Never  Vaping Use   Vaping Use: Never used  Substance and Sexual Activity   Alcohol use: Yes    Comment: Occas   Drug use: Never   Sexual activity: Yes    Partners: Male    Birth control/protection: None, Post-menopausal

## 2022-03-07 ENCOUNTER — Encounter: Payer: Self-pay | Admitting: Orthopaedic Surgery

## 2022-03-08 DIAGNOSIS — M25562 Pain in left knee: Secondary | ICD-10-CM | POA: Diagnosis not present

## 2022-03-09 ENCOUNTER — Other Ambulatory Visit: Payer: Self-pay | Admitting: Orthopaedic Surgery

## 2022-03-09 ENCOUNTER — Telehealth: Payer: Self-pay | Admitting: Orthopaedic Surgery

## 2022-03-09 MED ORDER — MELOXICAM 15 MG PO TABS: 15.0000 mg | ORAL_TABLET | Freq: Every day | ORAL | 3 refills | Status: DC | PRN

## 2022-03-09 NOTE — Telephone Encounter (Signed)
Please advise 

## 2022-03-09 NOTE — Telephone Encounter (Signed)
Patient called asked if she can get a refill on Rx Meloxicam? The number to contact patient is [phone]

## 2022-03-13 ENCOUNTER — Ambulatory Visit
Admission: RE | Admit: 2022-03-13 | Discharge: 2022-03-13 | Disposition: A | Discharge status: Home / Self Care | Payer: PPO | Source: Ambulatory Visit | Attending: Family Medicine | Admitting: Family Medicine

## 2022-03-13 DIAGNOSIS — Z1231 Encounter for screening mammogram for malignant neoplasm of breast: Secondary | ICD-10-CM | POA: Diagnosis not present

## 2022-03-21 ENCOUNTER — Emergency Department: Payer: PPO

## 2022-03-21 ENCOUNTER — Emergency Department
Admission: EM | Admit: 2022-03-21 | Discharge: 2022-03-21 | Disposition: A | Discharge status: Home / Self Care | Payer: PPO | Attending: Student in an Organized Health Care Education/Training Program | Admitting: Student in an Organized Health Care Education/Training Program

## 2022-03-21 DIAGNOSIS — Y92410 Unspecified street and highway as the place of occurrence of the external cause: Secondary | ICD-10-CM | POA: Insufficient documentation

## 2022-03-21 DIAGNOSIS — R519 Headache, unspecified: Secondary | ICD-10-CM | POA: Diagnosis present

## 2022-03-21 DIAGNOSIS — J323 Chronic sphenoidal sinusitis: Secondary | ICD-10-CM | POA: Diagnosis not present

## 2022-03-21 DIAGNOSIS — S0990XA Unspecified injury of head, initial encounter: Secondary | ICD-10-CM | POA: Insufficient documentation
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
## 2022-03-21 NOTE — ED Notes (Signed)
Patient c/o being dizzy, is concerned about a concussion since she previously fell and hit her head this past August. Patient ambulated to room from lobby with a steady gait.
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
## 2022-03-27 ENCOUNTER — Encounter: Payer: Self-pay | Admitting: Orthopaedic Surgery

## 2022-03-29 ENCOUNTER — Encounter: Payer: Self-pay | Admitting: Physician Assistant

## 2022-03-29 ENCOUNTER — Ambulatory Visit (INDEPENDENT_AMBULATORY_CARE_PROVIDER_SITE_OTHER): Payer: PPO | Admitting: Physician Assistant

## 2022-03-29 ENCOUNTER — Ambulatory Visit: Payer: Self-pay

## 2022-03-29 DIAGNOSIS — M79605 Pain in left leg: Secondary | ICD-10-CM
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
## 2022-04-05 ENCOUNTER — Encounter: Payer: Self-pay | Admitting: Family Medicine
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
## 2022-04-10 ENCOUNTER — Ambulatory Visit: Payer: PPO | Admitting: Sports Medicine

## 2022-04-10 VITALS — BP 122/82 | HR 70 | Ht 64.0 in

## 2022-04-10 DIAGNOSIS — M7062 Trochanteric bursitis, left hip: Secondary | ICD-10-CM

## 2022-04-10 DIAGNOSIS — M5432 Sciatica, left side: Secondary | ICD-10-CM

## 2022-04-10 DIAGNOSIS — S76012A Strain of muscle, fascia and tendon of left hip, initial encounter: Secondary | ICD-10-CM | POA: Diagnosis not present

## 2022-04-10 MED ORDER — TIZANIDINE HCL 4 MG PO TABS: 4.0000 mg | ORAL_TABLET | Freq: Every day | ORAL | 0 refills | Status: DC

## 2022-04-10 NOTE — Patient Instructions (Addendum)
Good to see you  Zanaflex 4 mg nightly as needed  Piriformis HEP  2 week follow up

## 2022-04-11 ENCOUNTER — Ambulatory Visit: Payer: PPO | Admitting: Family Medicine

## 2022-04-12 ENCOUNTER — Other Ambulatory Visit: Payer: Self-pay

## 2022-04-16 ENCOUNTER — Encounter: Payer: Self-pay | Admitting: Sports Medicine

## 2022-04-17 ENCOUNTER — Ambulatory Visit: Payer: PPO | Admitting: Sports Medicine

## 2022-04-17 VITALS — BP 120/84 | HR 86 | Ht 64.0 in | Wt 143.0 lb

## 2022-04-17 DIAGNOSIS — M5432 Sciatica, left side: Secondary | ICD-10-CM

## 2022-04-17 DIAGNOSIS — G8929 Other chronic pain: Secondary | ICD-10-CM | POA: Diagnosis not present

## 2022-04-17 DIAGNOSIS — M5442 Lumbago with sciatica, left side: Secondary | ICD-10-CM | POA: Diagnosis not present

## 2022-04-17 NOTE — Patient Instructions (Signed)
May continue meloxicam and tizanidine as needed Continue HEP  Left L4-L5  Follow up 1 week after epidural to discuss results
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
## 2022-04-18 ENCOUNTER — Other Ambulatory Visit: Payer: Self-pay | Admitting: Sports Medicine

## 2022-04-18 DIAGNOSIS — M5432 Sciatica, left side: Secondary | ICD-10-CM

## 2022-04-18 DIAGNOSIS — G8929 Other chronic pain: Secondary | ICD-10-CM

## 2022-04-18 NOTE — Progress Notes (Unsigned)
Spoke with patient passed along Dr. Marisue Brooklyn  message , MRI was placed  Can we call and speak with patient and let her know that radiology will not schedule a lumbar epidural until patient has a lumbar MRI.  We can order a lumbar MRI to be done at [facility] to try and expedite the process so that MRI and injection can still be done before patient's surgery date on 05/08/2022.  If patient wishes to proceed, recommend ordering lumbar MRI without contrast.

## 2022-04-20 ENCOUNTER — Other Ambulatory Visit: Payer: Self-pay | Admitting: Sports Medicine

## 2022-04-21 ENCOUNTER — Ambulatory Visit (INDEPENDENT_AMBULATORY_CARE_PROVIDER_SITE_OTHER): Payer: PPO

## 2022-04-21 DIAGNOSIS — G8929 Other chronic pain: Secondary | ICD-10-CM

## 2022-04-21 DIAGNOSIS — M5442 Lumbago with sciatica, left side: Secondary | ICD-10-CM | POA: Diagnosis not present

## 2022-04-21 DIAGNOSIS — S32010A Wedge compression fracture of first lumbar vertebra, initial encounter for closed fracture: Secondary | ICD-10-CM | POA: Diagnosis not present

## 2022-04-21 DIAGNOSIS — M5432 Sciatica, left side: Secondary | ICD-10-CM

## 2022-04-22 ENCOUNTER — Other Ambulatory Visit: Payer: Self-pay | Admitting: Sports Medicine

## 2022-04-23 ENCOUNTER — Encounter: Payer: PPO | Admitting: Family Medicine

## 2022-04-23 ENCOUNTER — Other Ambulatory Visit: Payer: Self-pay | Admitting: Sports Medicine

## 2022-04-23 ENCOUNTER — Telehealth: Payer: Self-pay | Admitting: Sports Medicine

## 2022-04-23 MED ORDER — TIZANIDINE HCL 4 MG PO TABS: 4.0000 mg | ORAL_TABLET | Freq: Every day | ORAL | 0 refills | Status: DC

## 2022-04-23 NOTE — Telephone Encounter (Signed)
Pt informed of refill via phone.

## 2022-04-23 NOTE — Telephone Encounter (Signed)
Pt calling for refill of Tizanidine to [facility].

## 2022-04-23 NOTE — Progress Notes (Unsigned)
Rx has been refilled.  

## 2022-04-24 ENCOUNTER — Ambulatory Visit: Payer: PPO | Admitting: Sports Medicine
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
## 2022-04-25 ENCOUNTER — Encounter: Payer: Self-pay | Admitting: Sports Medicine

## 2022-04-25 ENCOUNTER — Telehealth: Payer: Self-pay | Admitting: Orthopaedic Surgery

## 2022-04-25 ENCOUNTER — Ambulatory Visit: Payer: PPO | Admitting: Sports Medicine

## 2022-04-25 VITALS — HR 73 | Ht 64.0 in | Wt 143.0 lb

## 2022-04-25 DIAGNOSIS — M4716 Other spondylosis with myelopathy, lumbar region: Secondary | ICD-10-CM | POA: Diagnosis not present

## 2022-04-25 DIAGNOSIS — M5432 Sciatica, left side: Secondary | ICD-10-CM

## 2022-04-25 DIAGNOSIS — G8929 Other chronic pain: Secondary | ICD-10-CM

## 2022-04-25 DIAGNOSIS — M5442 Lumbago with sciatica, left side: Secondary | ICD-10-CM

## 2022-04-25 MED ORDER — GABAPENTIN 100 MG PO CAPS: 100.0000 mg | ORAL_CAPSULE | Freq: Two times a day (BID) | ORAL | 0 refills | Status: DC

## 2022-04-25 NOTE — Telephone Encounter (Signed)
Patient states she need to reschedule her knee replacement.Marland Kitchen

## 2022-04-25 NOTE — Patient Instructions (Addendum)
Good to see you Epidural left side l5-s1  Can stop zanflex Gabapentin 100 mg 2x a day  2 week follow

## 2022-04-26 ENCOUNTER — Ambulatory Visit
Admission: RE | Admit: 2022-04-26 | Discharge: 2022-04-26 | Disposition: A | Discharge status: Home / Self Care | Payer: PPO | Source: Ambulatory Visit | Attending: Sports Medicine | Admitting: Sports Medicine

## 2022-04-26 DIAGNOSIS — M47817 Spondylosis without myelopathy or radiculopathy, lumbosacral region: Secondary | ICD-10-CM | POA: Diagnosis not present

## 2022-04-26 DIAGNOSIS — M5432 Sciatica, left side: Secondary | ICD-10-CM

## 2022-04-26 DIAGNOSIS — G8929 Other chronic pain: Secondary | ICD-10-CM

## 2022-04-26 MED ORDER — METHYLPREDNISOLONE ACETATE 40 MG/ML INJ SUSP (RADIOLOG
80.0000 mg | Freq: Once | INTRAMUSCULAR | Status: AC
  Administered 2022-04-26: 80 mg via EPIDURAL

## 2022-04-26 MED ORDER — IOPAMIDOL (ISOVUE-M 200) INJECTION 41%
1.0000 mL | Freq: Once | INTRAMUSCULAR | Status: AC
  Administered 2022-04-26: 1 mL via EPIDURAL

## 2022-04-26 NOTE — Telephone Encounter (Signed)
Spoke with patient, she will call me back in a week or two about rescheduling once she sees if ESI has helped.

## 2022-04-26 NOTE — Discharge Instructions (Signed)

## 2022-04-28 ENCOUNTER — Other Ambulatory Visit: Payer: Self-pay | Admitting: Family Medicine

## 2022-05-03 ENCOUNTER — Encounter: Payer: Self-pay | Admitting: Family Medicine

## 2022-05-03 ENCOUNTER — Ambulatory Visit (INDEPENDENT_AMBULATORY_CARE_PROVIDER_SITE_OTHER): Payer: PPO | Admitting: Family Medicine

## 2022-05-03 VITALS — BP 115/73 | HR 59 | Temp 98.3°F | Ht 64.0 in | Wt 138.8 lb

## 2022-05-03 DIAGNOSIS — E782 Mixed hyperlipidemia: Secondary | ICD-10-CM

## 2022-05-03 DIAGNOSIS — Z8543 Personal history of malignant neoplasm of ovary: Secondary | ICD-10-CM | POA: Diagnosis not present

## 2022-05-03 DIAGNOSIS — Z780 Asymptomatic menopausal state: Secondary | ICD-10-CM | POA: Diagnosis not present

## 2022-05-03 DIAGNOSIS — M8580 Other specified disorders of bone density and structure, unspecified site: Secondary | ICD-10-CM

## 2022-05-03 DIAGNOSIS — M1712 Unilateral primary osteoarthritis, left knee: Secondary | ICD-10-CM

## 2022-05-03 DIAGNOSIS — M47817 Spondylosis without myelopathy or radiculopathy, lumbosacral region: Secondary | ICD-10-CM | POA: Insufficient documentation

## 2022-05-03 DIAGNOSIS — K219 Gastro-esophageal reflux disease without esophagitis: Secondary | ICD-10-CM | POA: Diagnosis not present

## 2022-05-03 DIAGNOSIS — Z79899 Other long term (current) drug therapy: Secondary | ICD-10-CM

## 2022-05-03 DIAGNOSIS — M5432 Sciatica, left side: Secondary | ICD-10-CM | POA: Diagnosis not present

## 2022-05-03 DIAGNOSIS — Z0000 Encounter for general adult medical examination without abnormal findings: Secondary | ICD-10-CM | POA: Diagnosis not present

## 2022-05-03 DIAGNOSIS — Z1211 Encounter for screening for malignant neoplasm of colon: Secondary | ICD-10-CM | POA: Diagnosis not present

## 2022-05-03 DIAGNOSIS — K2270 Barrett's esophagus without dysplasia: Secondary | ICD-10-CM

## 2022-05-03 DIAGNOSIS — D373 Neoplasm of uncertain behavior of appendix: Secondary | ICD-10-CM | POA: Diagnosis not present

## 2022-05-03 LAB — TSH: TSH: 2.39 u[IU]/mL (ref 0.35–5.50)

## 2022-05-03 LAB — COMPREHENSIVE METABOLIC PANEL
ALT: 7 U/L (ref 0–35)
AST: 13 U/L (ref 0–37)
Albumin: 4.5 g/dL (ref 3.5–5.2)
Alkaline Phosphatase: 45 U/L (ref 39–117)
BUN: 19 mg/dL (ref 6–23)
CO2: 30 mEq/L (ref 19–32)
Calcium: 9.7 mg/dL (ref 8.4–10.5)
Chloride: 102 mEq/L (ref 96–112)
Creatinine, Ser: 0.77 mg/dL (ref 0.40–1.20)
GFR: 78.45 mL/min (ref >60.00)
Glucose, Bld: 93 mg/dL (ref 70–99)
Potassium: 4.1 mEq/L (ref 3.5–5.1)
Sodium: 141 mEq/L (ref 135–145)
Total Bilirubin: 0.7 mg/dL (ref 0.2–1.2)
Total Protein: 6.7 g/dL (ref 6.0–8.3)

## 2022-05-03 LAB — CBC WITH DIFFERENTIAL/PLATELET
Basophils Absolute: 0 10*3/uL (ref 0.0–0.1)
Basophils Relative: 0.4 % (ref 0.0–3.0)
Eosinophils Absolute: 0.1 10*3/uL (ref 0.0–0.7)
Eosinophils Relative: 2.7 % (ref 0.0–5.0)
HCT: 41.1 % (ref 36.0–46.0)
Hemoglobin: 13.9 g/dL (ref 12.0–15.0)
Lymphocytes Relative: 21.6 % (ref 12.0–46.0)
Lymphs Abs: 1.1 10*3/uL (ref 0.7–4.0)
MCHC: 33.8 g/dL (ref 30.0–36.0)
MCV: 90.4 fl (ref 78.0–100.0)
Monocytes Absolute: 0.5 10*3/uL (ref 0.1–1.0)
Monocytes Relative: 10.1 % (ref 3.0–12.0)
Neutro Abs: 3.4 10*3/uL (ref 1.4–7.7)
Neutrophils Relative %: 65.2 % (ref 43.0–77.0)
Platelets: 229 10*3/uL (ref 150.0–400.0)
RBC: 4.54 Mil/uL (ref 3.87–5.11)
RDW: 13 % (ref 11.5–15.5)
WBC: 5.2 10*3/uL (ref 4.0–10.5)

## 2022-05-03 LAB — LIPID PANEL
Cholesterol: 230 mg/dL — ABNORMAL HIGH (ref 0–200)
HDL: 91.8 mg/dL (ref >39.00)
LDL Cholesterol: 115 mg/dL — ABNORMAL HIGH (ref 0–99)
NonHDL: 138.46
Total CHOL/HDL Ratio: 3
Triglycerides: 117 mg/dL (ref 0.0–149.0)
VLDL: 23.4 mg/dL (ref 0.0–40.0)

## 2022-05-03 LAB — VITAMIN B12: Vitamin B-12: 293 pg/mL (ref 211–911)

## 2022-05-03 MED ORDER — SIMVASTATIN 20 MG PO TABS: 20.0000 mg | ORAL_TABLET | Freq: Every evening | ORAL | 3 refills | Status: DC

## 2022-05-03 MED ORDER — PREDNISONE 10 MG PO TABS: ORAL_TABLET | ORAL | 0 refills | Status: DC

## 2022-05-03 MED ORDER — TRAMADOL HCL 50 MG PO TABS: 50.0000 mg | ORAL_TABLET | Freq: Three times a day (TID) | ORAL | 0 refills | Status: AC | PRN

## 2022-05-03 NOTE — Patient Instructions (Addendum)
Please return in 12 months for your annual complete physical; please come fasting. Sooner if need anything.   I will release your lab results to you on your MyChart account with further instructions. You may see the results before I do, but when I review them I will send you a message with my report or have my assistant call you if things need to be discussed. Please reply to my message with any questions. Thank you!   If you have any questions or concerns, please don't hesitate to send me a message via MyChart or call the office at [phone]. Thank you for visiting with [place] today! It's our pleasure caring for you.   Call GI to get back in to discuss EGD and whether you need colonoscopy now. Your last colonoscopy was done in 2019 and it was recommended to repeat in 5 years, this year. The record is in your chart and GI should be able to see it.   I have ordered a mammogram and/or bone density for you as we discussed today: '[]'$   Mammogram  '[x]'$   Bone Density  Please call the office checked below to schedule your appointment:  '[x]'$   [facility]      [address] [place], [place]         '[]'$   [facility]  [address] [place], [place]
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
## 2022-05-08 ENCOUNTER — Ambulatory Visit: Admit: 2022-05-08 | Payer: PPO | Admitting: Orthopaedic Surgery

## 2022-05-08 SURGERY — ARTHROPLASTY, KNEE, TOTAL
Anesthesia: Spinal | Site: Knee | Laterality: Left
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
## 2022-05-09 ENCOUNTER — Ambulatory Visit: Payer: Self-pay

## 2022-05-09 ENCOUNTER — Ambulatory Visit (INDEPENDENT_AMBULATORY_CARE_PROVIDER_SITE_OTHER): Payer: PPO | Admitting: Sports Medicine

## 2022-05-09 VITALS — HR 81 | Ht 64.0 in | Wt 138.0 lb

## 2022-05-09 DIAGNOSIS — G8929 Other chronic pain: Secondary | ICD-10-CM

## 2022-05-09 DIAGNOSIS — M5442 Lumbago with sciatica, left side: Secondary | ICD-10-CM

## 2022-05-09 DIAGNOSIS — M4716 Other spondylosis with myelopathy, lumbar region: Secondary | ICD-10-CM

## 2022-05-09 DIAGNOSIS — M5432 Sciatica, left side: Secondary | ICD-10-CM

## 2022-05-09 MED ORDER — GABAPENTIN 100 MG PO CAPS: 200.0000 mg | ORAL_CAPSULE | Freq: Two times a day (BID) | ORAL | 0 refills | Status: DC

## 2022-05-09 NOTE — Patient Instructions (Addendum)
Good to see you Increase gabapentin 200 mg 2 times a day  Restart glute and piriformis HEP  2 week follow up

## 2022-05-14 ENCOUNTER — Encounter: Payer: Self-pay | Admitting: Family Medicine

## 2022-05-14 DIAGNOSIS — E538 Deficiency of other specified B group vitamins: Secondary | ICD-10-CM | POA: Insufficient documentation

## 2022-05-17 ENCOUNTER — Encounter: Payer: PPO | Admitting: Orthopaedic Surgery

## 2022-05-17 DIAGNOSIS — M5440 Lumbago with sciatica, unspecified side: Secondary | ICD-10-CM | POA: Diagnosis not present

## 2022-05-17 DIAGNOSIS — M48061 Spinal stenosis, lumbar region without neurogenic claudication: Secondary | ICD-10-CM | POA: Diagnosis not present

## 2022-05-18 ENCOUNTER — Encounter: Payer: Self-pay | Admitting: Sports Medicine

## 2022-05-21 NOTE — Telephone Encounter (Signed)
Patient called to make sure that Dr Glennon Mac saw this message sent to him. She was confused when she got a response from Dr Georgina Snell.

## 2022-05-22 ENCOUNTER — Encounter: Payer: PPO | Admitting: Orthopaedic Surgery

## 2022-05-23 ENCOUNTER — Ambulatory Visit: Payer: PPO | Admitting: Sports Medicine

## 2022-05-25 ENCOUNTER — Other Ambulatory Visit: Payer: Self-pay

## 2022-05-25 ENCOUNTER — Encounter: Payer: PPO | Admitting: Family Medicine

## 2022-05-31 ENCOUNTER — Other Ambulatory Visit: Payer: Self-pay | Admitting: Physician Assistant

## 2022-05-31 DIAGNOSIS — Z01818 Encounter for other preprocedural examination: Secondary | ICD-10-CM

## 2022-06-05 NOTE — Pre-Procedure Instructions (Signed)
Surgical Instructions    Your procedure is scheduled on Tuesday, June 12, 2022 at 2:24 PM.  Report to [facility] "A" at 12:20 PM., then check in with the Admitting office.  Call this number if you have problems the morning of surgery:  [phone]   If you have any questions prior to your surgery date call [phone]: Open Monday-Friday 8am-4pm  *If you experience any cold or flu symptoms such as cough, fever, chills, shortness of breath, etc. between now and your scheduled surgery, please notify us.*    Remember:  Do not eat after midnight the night before your surgery  You may drink clear liquids until 11:20 AM the morning of your surgery.   Clear liquids allowed are: Water, Non-Citrus Juices (without pulp), Carbonated Beverages, Clear Tea, Black Coffee Only (NO MILK, CREAM OR POWDERED CREAMER of any kind), and Gatorade.  Patient Instructions  The night before surgery:  No food after midnight. ONLY clear liquids after midnight  The day of surgery (if you do NOT have diabetes):  Drink ONE (1) Pre-Surgery Clear Ensure by 11:20 AM the morning of surgery. Drink in one sitting. Do not sip.  This drink was given to you during your hospital  pre-op appointment visit.  Nothing else to drink after completing the  Pre-Surgery Clear Ensure.         If you have questions, please contact your surgeon's office.     Take these medicines the morning of surgery with A SIP OF WATER:  gabapentin (NEURONTIN)  omeprazole (PRILOSEC)   As of today, STOP taking any Aspirin (unless otherwise instructed by your surgeon) Aleve, Naproxen, Ibuprofen, Motrin, Advil, Goody's, BC's, all herbal medications, fish oil, and all vitamins.                     Do NOT Smoke (Tobacco/Vaping) for 24 hours prior to your procedure.  If you use a CPAP at night, you may bring your mask/headgear for your overnight stay.   Contacts, glasses, piercing's, hearing aid's, dentures or partials may not  be worn into surgery, please bring cases for these belongings.    For patients admitted to the hospital, discharge time will be determined by your treatment team.   Patients discharged the day of surgery will not be allowed to drive home, and someone needs to stay with them for 24 hours.  SURGICAL WAITING ROOM VISITATION Patients having surgery or a procedure may have 2 support people in the waiting area. Visitors may stay in the waiting area during the procedure and switch out with other visitors if needed. Only 1 support person is allowed in the pre-op area with the patient AFTER the patient is prepped. This person cannot be switched out.  Children under the age of 62 must have an adult accompany them who is not the patient. If the patient needs to stay at the hospital during part of their recovery, the visitor guidelines for inpatient rooms apply.  Please refer to the [facility] website for the visitor guidelines for Inpatients (after your surgery is over and you are in a regular room).    Special instructions:   [place]- Preparing For Surgery  Before surgery, you can play an important role. Because skin is not sterile, your skin needs to be as free of germs as possible. You can reduce the number of germs on your skin by washing with CHG (chlorahexidine gluconate) Soap before surgery.  CHG is an antiseptic cleaner which  kills germs and bonds with the skin to continue killing germs even after washing.    Oral Hygiene is also important to reduce your risk of infection.  Remember - BRUSH YOUR TEETH THE MORNING OF SURGERY WITH YOUR REGULAR TOOTHPASTE  Please do not use if you have an allergy to CHG or antibacterial soaps. If your skin becomes reddened/irritated stop using the CHG.  Do not shave (including legs and underarms) for at least 48 hours prior to first CHG shower. It is OK to shave your face.  Please follow these instructions carefully.   Shower the NIGHT BEFORE SURGERY and  the MORNING OF SURGERY  If you chose to wash your hair, wash your hair first as usual with your normal shampoo.  After you shampoo, rinse your hair and body thoroughly to remove the shampoo.  Use CHG Soap as you would any other liquid soap. You can apply CHG directly to the skin and wash gently with a scrungie or a clean washcloth.   Apply the CHG Soap to your body ONLY FROM THE NECK DOWN (neck, arms, chest, abdomen, legs, and back).  Do not use on open wounds or open sores. Avoid contact with your eyes, ears, mouth and genitals (private parts). Wash Face and genitals (private parts)  with your normal soap.   Wash thoroughly, paying special attention to the area where your surgery will be performed.  Thoroughly rinse your body with warm water from the neck down.  DO NOT shower/wash with your normal soap after using and rinsing off the CHG Soap.  Pat yourself dry with a CLEAN TOWEL.  Wear CLEAN PAJAMAS to bed the night before surgery  Place CLEAN SHEETS on your bed the night before your surgery  DO NOT SLEEP WITH PETS.   Day of Surgery: Take a shower with CHG soap. Do not wear lotions, powders, perfumes/colognes, or deodorant. Do not wear jewelry or makeup Do not shave 48 hours prior to surgery.  Men may shave face and neck. Do not wear nail polish, gel polish, artificial nails, or any other type of covering on natural nails (fingers and toes) If you have artificial nails or gel coating that need to be removed by a nail salon, please have this removed prior to surgery. Artificial nails or gel coating may interfere with anesthesia's ability to adequately monitor your vital signs. Wear Clean/Comfortable clothing the morning of surgery Do not bring valuables to the hospital.  [facility] is not responsible for any belongings or valuables. Remember to brush your teeth WITH YOUR REGULAR TOOTHPASTE.   Please read over the following fact sheets that you were given.  If you received a  COVID test during your pre-op visit  it is requested that you wear a mask when out in public, stay away from anyone that may not be feeling well and notify your surgeon if you develop symptoms. If you have been in contact with anyone that has tested positive in the last 10 days please notify you surgeon.

## 2022-06-06 ENCOUNTER — Other Ambulatory Visit: Payer: Self-pay

## 2022-06-06 ENCOUNTER — Encounter (HOSPITAL_COMMUNITY): Payer: Self-pay

## 2022-06-06 ENCOUNTER — Encounter (HOSPITAL_COMMUNITY)
Admission: RE | Admit: 2022-06-06 | Discharge: 2022-06-06 | Disposition: A | Discharge status: Home / Self Care | Payer: PPO | Source: Ambulatory Visit | Attending: Orthopaedic Surgery | Admitting: Orthopaedic Surgery

## 2022-06-06 VITALS — BP 123/78 | HR 73 | Temp 98.0°F | Resp 18 | Ht 64.0 in | Wt 141.0 lb

## 2022-06-06 DIAGNOSIS — Z01818 Encounter for other preprocedural examination: Secondary | ICD-10-CM | POA: Diagnosis not present

## 2022-06-06 LAB — COMPREHENSIVE METABOLIC PANEL
ALT: 14 U/L (ref 0–44)
AST: 18 U/L (ref 15–41)
Albumin: 3.9 g/dL (ref 3.5–5.0)
Alkaline Phosphatase: 51 U/L (ref 38–126)
Anion gap: 10 (ref 5–15)
BUN: 21 mg/dL (ref 8–23)
CO2: 27 mmol/L (ref 22–32)
Calcium: 9.3 mg/dL (ref 8.9–10.3)
Chloride: 102 mmol/L (ref 98–111)
Creatinine, Ser: 0.8 mg/dL (ref 0.44–1.00)
GFR, Estimated: >60 mL/min (ref >60)
Glucose, Bld: 121 mg/dL — ABNORMAL HIGH (ref 70–99)
Potassium: 3.8 mmol/L (ref 3.5–5.1)
Sodium: 139 mmol/L (ref 135–145)
Total Bilirubin: 0.8 mg/dL (ref 0.3–1.2)
Total Protein: 6.5 g/dL (ref 6.5–8.1)

## 2022-06-06 LAB — CBC
HCT: 42.4 % (ref 36.0–46.0)
Hemoglobin: 14.1 g/dL (ref 12.0–15.0)
MCH: 31.1 pg (ref 26.0–34.0)
MCHC: 33.3 g/dL (ref 30.0–36.0)
MCV: 93.6 fL (ref 80.0–100.0)
Platelets: 267 10*3/uL (ref 150–400)
RBC: 4.53 MIL/uL (ref 3.87–5.11)
RDW: 12.8 % (ref 11.5–15.5)
WBC: 6.1 10*3/uL (ref 4.0–10.5)
nRBC: 0 % (ref 0.0–0.2)

## 2022-06-06 LAB — TYPE AND SCREEN
ABO/RH(D): B POS
Antibody Screen: NEGATIVE

## 2022-06-06 LAB — SURGICAL PCR SCREEN
MRSA, PCR: NEGATIVE
Staphylococcus aureus: NEGATIVE

## 2022-06-06 NOTE — Progress Notes (Signed)
PCP - Billey Chang Cardiologist - denies  PPM/ICD - denies  Chest x-ray - n/a EKG - n/a Stress Test - denies ECHO - denies Cardiac Cath - denies  Sleep Study - denies  ERAS Protcol -yes PRE-SURGERY Ensure or G2- ensure ordered and given  COVID TEST- not needed   Anesthesia review: no  Patient denies shortness of breath, fever, cough and chest pain at PAT appointment   All instructions explained to the patient, with a verbal understanding of the material. Patient agrees to go over the instructions while at home for a better understanding. Patient also instructed to self quarantine after being tested for COVID-19. The opportunity to ask questions was provided.

## 2022-06-07 ENCOUNTER — Ambulatory Visit: Payer: PPO

## 2022-06-07 NOTE — Progress Notes (Signed)
Pt presents to OPPT for evaluation of ongoing subacute Left low back pain, left hip pain, left sciatica. Pt has many questions about course of care with PT, upcoming TKA. Ultimately pt decides her CC symptoms are managed adequately at this time to postpone evaluation and treatment until after she has completed the rehab process for her TKA on March 6.   No charges billed this visit. Will plan on seeing pt here on 3/19 for 2 week postop evaluation s/p TKA, order pending as pt will likely start with HHPT first.   10:40 AM, 06/07/22 Etta Grandchild, PT, DPT Physical Therapist - [place] [phone] (Office)

## 2022-06-08 ENCOUNTER — Other Ambulatory Visit: Payer: Self-pay | Admitting: Family Medicine

## 2022-06-08 DIAGNOSIS — Z1231 Encounter for screening mammogram for malignant neoplasm of breast: Secondary | ICD-10-CM

## 2022-06-08 DIAGNOSIS — M8580 Other specified disorders of bone density and structure, unspecified site: Secondary | ICD-10-CM

## 2022-06-11 ENCOUNTER — Telehealth: Payer: Self-pay | Admitting: *Deleted

## 2022-06-11 NOTE — Telephone Encounter (Signed)
Ortho bundle pre-op call completed. 

## 2022-06-11 NOTE — Care Plan (Signed)
[facility] RNCM call to patient to discuss her upcoming Left total knee arthroplasty with Dr. Ninfa Linden on 06/12/22. She is an Ortho bundle patient through [facility] and is agreeable to case management. She lives with her spouse, who will be available to assist her after discharge at home. Anticipate HHPT will be needed after short hospital stay. Referral made to [facility] after choice provided. Patient does not have a RW and will need one prior to discharge. Reviewed all post op care instructions. Will continue to follow for needs.

## 2022-06-11 NOTE — H&P (Signed)
TOTAL KNEE ADMISSION H&P  Patient is being admitted for left total knee arthroplasty.  Subjective:  Chief Complaint:left knee pain.  HPI: Kristen Stephenson, 72 y.o. female, has a history of pain and functional disability in the left knee due to arthritis and has failed non-surgical conservative treatments for greater than 12 weeks to includeNSAID's and/or analgesics, corticosteriod injections, flexibility and strengthening excercises, and activity modification.  Onset of symptoms was gradual, starting 5 years ago with gradually worsening course since that time. The patient noted prior procedures on the knee to include  ACL reconstruction on the left knee(s).  Patient currently rates pain in the left knee(s) at 10 out of 10 with activity. Patient has night pain, worsening of pain with activity and weight bearing, pain that interferes with activities of daily living, pain with passive range of motion, crepitus, and joint swelling.  Patient has evidence of subchondral sclerosis, periarticular osteophytes, and joint space narrowing by imaging studies. There is no active infection.  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   Anxiety 09/16/2018   History of ovarian cancer 09/16/2018   Mixed hyperlipidemia 09/16/2018   GERD (gastroesophageal reflux disease) 09/16/2018   Past Medical History:  Diagnosis Date   Adjustment disorder with anxiety    Anxiety    Arthritis    knee,left   Barrett's esophagus    [facility] Provider Based  Lic Grp-Needs Endoscopy in 2023   Cancer ([place])    cervix   Cataract    beginning   GERD (gastroesophageal reflux disease)    H/O ovarian cancer    Heart murmur    History of malignant neoplasm of appendix    Hyperlipidemia    Hyperlipidemia    Lung nodule    Osteopenia    Osteopenia after menopause 09/01/2019   dexa 08/2019   Ovarian cancer ([place]) 2018   Subclinical hypothyroidism 06/15/2019    Past Surgical History:  Procedure Laterality Date   ABDOMINAL HYSTERECTOMY     ANKLE SURGERY  2003   APPENDECTOMY  2018   Cancer/S/P resection and chemo   ARTHROSCOPIC REPAIR ACL Left    BREAST EXCISIONAL BIOPSY     CYST EXCISION Right 1972   Right breast/benign   FEMUR FRACTURE SURGERY Right    Plate with 5 screws   INGUINAL HERNIA REPAIR  11/2007   TONSILLECTOMY     VULVAR LESION REMOVAL  07/2007   benign    No current facility-administered medications for this encounter.   Current Outpatient Medications  Medication Sig Dispense Refill Last Dose   Calcium Carb-Cholecalciferol (CALCIUM 600 + D PO) Take 1 tablet by mouth daily.      Cholecalciferol (VITAMIN D3) 50 MCG (2000 UT) CAPS Take 2,000 Units by mouth daily.      cyanocobalamin (VITAMIN B12) 1000 MCG tablet Take 1,000 mcg by mouth daily.      gabapentin (NEURONTIN) 100 MG capsule Take 2 capsules (200 mg total) by mouth 2 (two) times daily. 120 capsule 0    omeprazole (PRILOSEC) 20 MG capsule Take 20 mg by mouth  daily.      simvastatin (ZOCOR) 20 MG tablet Take 1 tablet (20 mg total) by mouth at bedtime. 90 tablet 3    No Known Allergies  Social History   Tobacco Use   Smoking status: Former    Types: Cigarettes    Quit date: 1991    Years since quitting: 33.1    Passive exposure: Past   Smokeless tobacco: Never  Substance Use Topics   Alcohol use: Yes    Comment: Occas    Family History  Problem Relation Age of Onset   CAD Mother    Osteoporosis Mother    Diabetes Father    GER disease Father    Rheum  arthritis Sister    Asthma Sister    Diabetes Maternal Grandmother        amputation   Colon cancer Neg Hx    Colon polyps Neg Hx    Crohn's disease Neg Hx    Esophageal cancer Neg Hx    Rectal cancer Neg Hx    Stomach cancer Neg Hx    Ulcerative colitis Neg Hx      Review of Systems  All other systems reviewed and are negative.   Objective:  Physical Exam Vitals reviewed.  Constitutional:      Appearance: Normal appearance. She is normal weight.  HENT:     Head: Normocephalic and atraumatic.  Eyes:     Extraocular Movements: Extraocular movements intact.     Pupils: Pupils are equal, round, and reactive to light.  Cardiovascular:     Rate and Rhythm: Normal rate and regular rhythm.  Pulmonary:     Effort: Pulmonary effort is normal.     Breath sounds: Normal breath sounds.  Abdominal:     Palpations: Abdomen is soft.  Musculoskeletal:     Cervical back: Normal range of motion and neck supple.     Left knee: Effusion, bony tenderness and crepitus present. Decreased range of motion. Tenderness present over the medial joint line and lateral joint line. ACL laxity present. Abnormal alignment.  Neurological:     Mental Status: She is alert and oriented to person, place, and time.  Psychiatric:        Behavior: Behavior normal.     Vital signs in last 24 hours:    Labs:   Estimated body mass index is 24.2 kg/m as calculated from the following:   Height as of 06/06/22: '5\' 4"'$  (1.626 m).   Weight as of 06/06/22: 64 kg.   Imaging Review Plain radiographs demonstrate severe degenerative joint disease of the left knee(s). The overall alignment ismild varus. The bone quality appears to be excellent for age and reported activity level.      Assessment/Plan:  End stage arthritis, left knee   The patient history, physical examination, clinical judgment of the provider and imaging studies are consistent with end stage degenerative joint disease of the left knee(s)  and total knee arthroplasty is deemed medically necessary. The treatment options including medical management, injection therapy arthroscopy and arthroplasty were discussed at length. The risks and benefits of total knee arthroplasty were presented and reviewed. The risks due to aseptic loosening, infection, stiffness, patella tracking problems, thromboembolic complications and other imponderables were discussed. The patient acknowledged the explanation, agreed to proceed with the plan and consent was signed. Patient is being admitted for inpatient treatment for surgery, pain control, PT, OT, prophylactic antibiotics, VTE prophylaxis, progressive ambulation and ADL's and discharge planning. The patient is planning to be  discharged home with home health services

## 2022-06-12 ENCOUNTER — Encounter (HOSPITAL_COMMUNITY): Payer: Self-pay | Admitting: Orthopaedic Surgery

## 2022-06-12 ENCOUNTER — Observation Stay (HOSPITAL_COMMUNITY): Payer: PPO

## 2022-06-12 ENCOUNTER — Observation Stay (HOSPITAL_COMMUNITY)
Admission: RE | Admit: 2022-06-12 | Discharge: 2022-06-13 | Disposition: A | Discharge status: Home Health | Payer: PPO | Attending: Orthopaedic Surgery | Admitting: Orthopaedic Surgery

## 2022-06-12 ENCOUNTER — Encounter (HOSPITAL_COMMUNITY)
Admission: RE | Disposition: A | Discharge status: Home Health | Payer: Self-pay | Source: Home / Self Care | Attending: Orthopaedic Surgery

## 2022-06-12 ENCOUNTER — Ambulatory Visit (HOSPITAL_BASED_OUTPATIENT_CLINIC_OR_DEPARTMENT_OTHER): Payer: PPO | Admitting: Anesthesiology

## 2022-06-12 ENCOUNTER — Ambulatory Visit (HOSPITAL_COMMUNITY): Payer: PPO | Admitting: Anesthesiology

## 2022-06-12 DIAGNOSIS — Z96652 Presence of left artificial knee joint: Secondary | ICD-10-CM | POA: Diagnosis not present

## 2022-06-12 DIAGNOSIS — Z8543 Personal history of malignant neoplasm of ovary: Secondary | ICD-10-CM | POA: Insufficient documentation

## 2022-06-12 DIAGNOSIS — Z79899 Other long term (current) drug therapy: Secondary | ICD-10-CM | POA: Diagnosis not present

## 2022-06-12 DIAGNOSIS — M1712 Unilateral primary osteoarthritis, left knee: Secondary | ICD-10-CM

## 2022-06-12 DIAGNOSIS — G8918 Other acute postprocedural pain: Secondary | ICD-10-CM | POA: Diagnosis not present

## 2022-06-12 DIAGNOSIS — Z8541 Personal history of malignant neoplasm of cervix uteri: Secondary | ICD-10-CM | POA: Insufficient documentation

## 2022-06-12 DIAGNOSIS — Z87891 Personal history of nicotine dependence: Secondary | ICD-10-CM | POA: Insufficient documentation

## 2022-06-12 DIAGNOSIS — Z471 Aftercare following joint replacement surgery: Secondary | ICD-10-CM | POA: Diagnosis not present

## 2022-06-12 HISTORY — PX: TOTAL KNEE ARTHROPLASTY: SHX125

## 2022-06-12 LAB — ABO/RH: ABO/RH(D): B POS

## 2022-06-12 SURGERY — ARTHROPLASTY, KNEE, TOTAL
Anesthesia: Monitor Anesthesia Care | Site: Knee | Laterality: Left

## 2022-06-12 MED ORDER — METOCLOPRAMIDE HCL 5 MG PO TABS: 5.0000 mg | ORAL_TABLET | Freq: Three times a day (TID) | ORAL | Status: DC | PRN

## 2022-06-12 MED ORDER — FENTANYL CITRATE (PF) 100 MCG/2ML IJ SOLN
INTRAMUSCULAR | Status: AC
  Administered 2022-06-12: 50 ug via INTRAVENOUS
  Filled 2022-06-12: qty 2

## 2022-06-12 MED ORDER — OXYCODONE HCL 5 MG PO TABS
ORAL_TABLET | ORAL | Status: AC
  Filled 2022-06-12: qty 1

## 2022-06-12 MED ORDER — OXYCODONE HCL 5 MG PO TABS
5.0000 mg | ORAL_TABLET | ORAL | Status: DC | PRN
  Administered 2022-06-12: 10 mg via ORAL
  Administered 2022-06-12: 5 mg via ORAL
  Administered 2022-06-13 (×2): 10 mg via ORAL
  Filled 2022-06-12 (×3): qty 2

## 2022-06-12 MED ORDER — MIDAZOLAM HCL 2 MG/2ML IJ SOLN: 2.0000 mg | Freq: Once | INTRAMUSCULAR | Status: AC

## 2022-06-12 MED ORDER — METHOCARBAMOL 500 MG PO TABS
500.0000 mg | ORAL_TABLET | Freq: Four times a day (QID) | ORAL | Status: DC | PRN
  Administered 2022-06-12 – 2022-06-13 (×3): 500 mg via ORAL
  Filled 2022-06-12 (×3): qty 1

## 2022-06-12 MED ORDER — ORAL CARE MOUTH RINSE: 15.0000 mL | Freq: Once | OROMUCOSAL | Status: AC

## 2022-06-12 MED ORDER — ONDANSETRON HCL 4 MG/2ML IJ SOLN: 4.0000 mg | Freq: Once | INTRAMUSCULAR | Status: DC | PRN

## 2022-06-12 MED ORDER — 0.9 % SODIUM CHLORIDE (POUR BTL) OPTIME
TOPICAL | Status: DC | PRN
  Administered 2022-06-12: 1000 mL

## 2022-06-12 MED ORDER — ONDANSETRON HCL 4 MG PO TABS: 4.0000 mg | ORAL_TABLET | Freq: Four times a day (QID) | ORAL | Status: DC | PRN

## 2022-06-12 MED ORDER — CHLORHEXIDINE GLUCONATE 0.12 % MT SOLN
15.0000 mL | Freq: Once | OROMUCOSAL | Status: AC
  Administered 2022-06-12: 15 mL via OROMUCOSAL
  Filled 2022-06-12: qty 15

## 2022-06-12 MED ORDER — BUPIVACAINE-EPINEPHRINE (PF) 0.25% -1:200000 IJ SOLN
INTRAMUSCULAR | Status: AC
  Filled 2022-06-12: qty 30

## 2022-06-12 MED ORDER — HYDROMORPHONE HCL 1 MG/ML IJ SOLN
0.5000 mg | INTRAMUSCULAR | Status: DC | PRN
  Administered 2022-06-12 – 2022-06-13 (×3): 1 mg via INTRAVENOUS
  Filled 2022-06-12 (×3): qty 1

## 2022-06-12 MED ORDER — GLYCOPYRROLATE PF 0.2 MG/ML IJ SOSY
PREFILLED_SYRINGE | INTRAMUSCULAR | Status: AC
  Filled 2022-06-12: qty 1

## 2022-06-12 MED ORDER — PHENYLEPHRINE HCL-NACL 20-0.9 MG/250ML-% IV SOLN
INTRAVENOUS | Status: DC | PRN
  Administered 2022-06-12: 50 ug/min via INTRAVENOUS

## 2022-06-12 MED ORDER — PROPOFOL 10 MG/ML IV BOLUS
INTRAVENOUS | Status: DC | PRN
  Administered 2022-06-12: 30 mg via INTRAVENOUS

## 2022-06-12 MED ORDER — EPHEDRINE SULFATE-NACL 50-0.9 MG/10ML-% IV SOSY
PREFILLED_SYRINGE | INTRAVENOUS | Status: DC | PRN
  Administered 2022-06-12 (×3): 5 mg via INTRAVENOUS

## 2022-06-12 MED ORDER — PHENOL 1.4 % MT LIQD: 1.0000 | OROMUCOSAL | Status: DC | PRN

## 2022-06-12 MED ORDER — METHOCARBAMOL 1000 MG/10ML IJ SOLN: 500.0000 mg | Freq: Four times a day (QID) | INTRAVENOUS | Status: DC | PRN

## 2022-06-12 MED ORDER — TRANEXAMIC ACID-NACL 1000-0.7 MG/100ML-% IV SOLN
1000.0000 mg | INTRAVENOUS | Status: AC
  Administered 2022-06-12: 1000 mg via INTRAVENOUS
  Filled 2022-06-12: qty 100

## 2022-06-12 MED ORDER — SODIUM CHLORIDE 0.9 % IV SOLN: INTRAVENOUS | Status: DC

## 2022-06-12 MED ORDER — MIDAZOLAM HCL 2 MG/2ML IJ SOLN
INTRAMUSCULAR | Status: DC | PRN
  Administered 2022-06-12: 2 mg via INTRAVENOUS

## 2022-06-12 MED ORDER — DEXAMETHASONE SODIUM PHOSPHATE 10 MG/ML IJ SOLN
INTRAMUSCULAR | Status: DC | PRN
  Administered 2022-06-12: 10 mg

## 2022-06-12 MED ORDER — DEXAMETHASONE SODIUM PHOSPHATE 10 MG/ML IJ SOLN
INTRAMUSCULAR | Status: AC
  Filled 2022-06-12: qty 1

## 2022-06-12 MED ORDER — ASPIRIN 81 MG PO CHEW
81.0000 mg | CHEWABLE_TABLET | Freq: Two times a day (BID) | ORAL | Status: DC
  Administered 2022-06-12 – 2022-06-13 (×2): 81 mg via ORAL
  Filled 2022-06-12 (×2): qty 1

## 2022-06-12 MED ORDER — MENTHOL 3 MG MT LOZG: 1.0000 | LOZENGE | OROMUCOSAL | Status: DC | PRN

## 2022-06-12 MED ORDER — FENTANYL CITRATE (PF) 100 MCG/2ML IJ SOLN: 50.0000 ug | Freq: Once | INTRAMUSCULAR | Status: AC

## 2022-06-12 MED ORDER — PROPOFOL 500 MG/50ML IV EMUL
INTRAVENOUS | Status: DC | PRN
  Administered 2022-06-12: 50 ug/kg/min via INTRAVENOUS

## 2022-06-12 MED ORDER — BUPIVACAINE IN DEXTROSE 0.75-8.25 % IT SOLN
INTRATHECAL | Status: DC | PRN
  Administered 2022-06-12: 1.6 mL via INTRATHECAL

## 2022-06-12 MED ORDER — DIPHENHYDRAMINE HCL 12.5 MG/5ML PO ELIX: 12.5000 mg | ORAL_SOLUTION | ORAL | Status: DC | PRN

## 2022-06-12 MED ORDER — MIDAZOLAM HCL 2 MG/2ML IJ SOLN
INTRAMUSCULAR | Status: AC
  Filled 2022-06-12: qty 2

## 2022-06-12 MED ORDER — ACETAMINOPHEN 500 MG PO TABS
1000.0000 mg | ORAL_TABLET | Freq: Once | ORAL | Status: AC
  Administered 2022-06-12: 1000 mg via ORAL
  Filled 2022-06-12: qty 2

## 2022-06-12 MED ORDER — ONDANSETRON HCL 4 MG/2ML IJ SOLN
INTRAMUSCULAR | Status: AC
  Filled 2022-06-12: qty 2

## 2022-06-12 MED ORDER — POLYETHYLENE GLYCOL 3350 17 G PO PACK: 17.0000 g | PACK | Freq: Every day | ORAL | Status: DC | PRN

## 2022-06-12 MED ORDER — ONDANSETRON HCL 4 MG/2ML IJ SOLN
INTRAMUSCULAR | Status: DC | PRN
  Administered 2022-06-12: 4 mg via INTRAVENOUS

## 2022-06-12 MED ORDER — SODIUM CHLORIDE 0.9 % IR SOLN
Status: DC | PRN
  Administered 2022-06-12: 1000 mL

## 2022-06-12 MED ORDER — POVIDONE-IODINE 10 % EX SWAB: 2.0000 | Freq: Once | CUTANEOUS | Status: DC

## 2022-06-12 MED ORDER — METOCLOPRAMIDE HCL 5 MG/ML IJ SOLN: 5.0000 mg | Freq: Three times a day (TID) | INTRAMUSCULAR | Status: DC | PRN

## 2022-06-12 MED ORDER — ONDANSETRON HCL 4 MG/2ML IJ SOLN
4.0000 mg | Freq: Four times a day (QID) | INTRAMUSCULAR | Status: DC | PRN
  Administered 2022-06-13: 4 mg via INTRAVENOUS
  Filled 2022-06-12: qty 2

## 2022-06-12 MED ORDER — CEFAZOLIN SODIUM-DEXTROSE 2-4 GM/100ML-% IV SOLN
2.0000 g | INTRAVENOUS | Status: AC
  Administered 2022-06-12: 2 g via INTRAVENOUS
  Filled 2022-06-12: qty 100

## 2022-06-12 MED ORDER — ALUM & MAG HYDROXIDE-SIMETH 200-200-20 MG/5ML PO SUSP: 30.0000 mL | ORAL | Status: DC | PRN

## 2022-06-12 MED ORDER — DEXAMETHASONE SODIUM PHOSPHATE 10 MG/ML IJ SOLN
INTRAMUSCULAR | Status: DC | PRN
  Administered 2022-06-12: 4 mg via INTRAVENOUS

## 2022-06-12 MED ORDER — HYDROMORPHONE HCL 2 MG PO TABS: 2.0000 mg | ORAL_TABLET | ORAL | Status: DC | PRN

## 2022-06-12 MED ORDER — DOCUSATE SODIUM 100 MG PO CAPS
100.0000 mg | ORAL_CAPSULE | Freq: Two times a day (BID) | ORAL | Status: DC
  Administered 2022-06-12 – 2022-06-13 (×2): 100 mg via ORAL
  Filled 2022-06-12 (×2): qty 1

## 2022-06-12 MED ORDER — PANTOPRAZOLE SODIUM 40 MG PO TBEC
40.0000 mg | DELAYED_RELEASE_TABLET | Freq: Every day | ORAL | Status: DC
  Administered 2022-06-12 – 2022-06-13 (×2): 40 mg via ORAL
  Filled 2022-06-12 (×2): qty 1

## 2022-06-12 MED ORDER — EPHEDRINE 5 MG/ML INJ
INTRAVENOUS | Status: AC
  Filled 2022-06-12: qty 5

## 2022-06-12 MED ORDER — ROPIVACAINE HCL 5 MG/ML IJ SOLN
INTRAMUSCULAR | Status: DC | PRN
  Administered 2022-06-12: 20 mL via PERINEURAL

## 2022-06-12 MED ORDER — GABAPENTIN 100 MG PO CAPS
200.0000 mg | ORAL_CAPSULE | Freq: Two times a day (BID) | ORAL | Status: DC
  Administered 2022-06-12 – 2022-06-13 (×2): 200 mg via ORAL
  Filled 2022-06-12 (×2): qty 2

## 2022-06-12 MED ORDER — LACTATED RINGERS IV SOLN: INTRAVENOUS | Status: DC

## 2022-06-12 MED ORDER — FENTANYL CITRATE (PF) 100 MCG/2ML IJ SOLN: 25.0000 ug | INTRAMUSCULAR | Status: DC | PRN

## 2022-06-12 MED ORDER — BUPIVACAINE-EPINEPHRINE 0.25% -1:200000 IJ SOLN
INTRAMUSCULAR | Status: DC | PRN
  Administered 2022-06-12: 30 mL

## 2022-06-12 MED ORDER — MIDAZOLAM HCL 2 MG/2ML IJ SOLN
INTRAMUSCULAR | Status: AC
  Administered 2022-06-12: 2 mg via INTRAVENOUS
  Filled 2022-06-12: qty 2

## 2022-06-12 MED ORDER — CEFAZOLIN SODIUM-DEXTROSE 1-4 GM/50ML-% IV SOLN
1.0000 g | Freq: Four times a day (QID) | INTRAVENOUS | Status: AC
  Administered 2022-06-12 – 2022-06-13 (×2): 1 g via INTRAVENOUS
  Filled 2022-06-12 (×2): qty 50

## 2022-06-12 MED ORDER — ACETAMINOPHEN 325 MG PO TABS
325.0000 mg | ORAL_TABLET | Freq: Four times a day (QID) | ORAL | Status: DC | PRN
  Administered 2022-06-13: 650 mg via ORAL
  Filled 2022-06-12: qty 2

## 2022-06-12 SURGICAL SUPPLY — 73 items
BAG COUNTER SPONGE SURGICOUNT (BAG) ×1 IMPLANT
BAG SPNG CNTER NS LX DISP (BAG) ×1
BANDAGE ESMARK 6X9 LF (GAUZE/BANDAGES/DRESSINGS) ×1 IMPLANT
BLADE SAG 18X100X1.27 (BLADE) ×1 IMPLANT
BNDG CMPR 5X62 HK CLSR LF (GAUZE/BANDAGES/DRESSINGS) ×1
BNDG CMPR 9X6 STRL LF SNTH (GAUZE/BANDAGES/DRESSINGS) ×1
BNDG ELASTIC 6INX 5YD STR LF (GAUZE/BANDAGES/DRESSINGS) IMPLANT
BNDG ELASTIC 6X5.8 VLCR STR LF (GAUZE/BANDAGES/DRESSINGS) ×2 IMPLANT
BNDG ESMARK 6X9 LF (GAUZE/BANDAGES/DRESSINGS) ×1
BOWL SMART MIX CTS (DISPOSABLE) IMPLANT
CEMENT BONE R 1X40 (Cement) IMPLANT
COMP FEM CEMT PERSONA STD SZ7 (Knees) ×1 IMPLANT
COMP TIB PS KNEE E 0D LT (Joint) ×1 IMPLANT
COMPONENT FEM CMT PRSN STD SZ7 (Knees) IMPLANT
COMPONET TIB PS KNEE E 0D LT (Joint) IMPLANT
COOLER ICEMAN CLASSIC (MISCELLANEOUS) ×1 IMPLANT
COVER SURGICAL LIGHT HANDLE (MISCELLANEOUS) ×1 IMPLANT
CUFF TOURN SGL QUICK 34 (TOURNIQUET CUFF) ×1
CUFF TOURN SGL QUICK 42 (TOURNIQUET CUFF) IMPLANT
CUFF TRNQT CYL 34X4.125X (TOURNIQUET CUFF) ×1 IMPLANT
DRAPE EXTREMITY T 121X128X90 (DISPOSABLE) ×1 IMPLANT
DRAPE HALF SHEET 40X57 (DRAPES) ×1 IMPLANT
DRAPE U-SHAPE 47X51 STRL (DRAPES) ×1 IMPLANT
DURAPREP 26ML APPLICATOR (WOUND CARE) ×1 IMPLANT
ELECT CAUTERY BLADE 6.4 (BLADE) ×1 IMPLANT
ELECT REM PT RETURN 9FT ADLT (ELECTROSURGICAL) ×1
ELECTRODE REM PT RTRN 9FT ADLT (ELECTROSURGICAL) ×1 IMPLANT
FACESHIELD WRAPAROUND (MASK) ×2 IMPLANT
FACESHIELD WRAPAROUND OR TEAM (MASK) ×2 IMPLANT
GAUZE PAD ABD 8X10 STRL (GAUZE/BANDAGES/DRESSINGS) ×1 IMPLANT
GAUZE SPONGE 4X4 12PLY STRL (GAUZE/BANDAGES/DRESSINGS) ×1 IMPLANT
GAUZE XEROFORM 1X8 LF (GAUZE/BANDAGES/DRESSINGS) ×1 IMPLANT
GLOVE BIOGEL PI IND STRL 8 (GLOVE) ×2 IMPLANT
GLOVE ORTHO TXT STRL SZ7.5 (GLOVE) ×1 IMPLANT
GLOVE SURG ORTHO 8.0 STRL STRW (GLOVE) ×1 IMPLANT
GOWN STRL REUS W/ TWL LRG LVL3 (GOWN DISPOSABLE) IMPLANT
GOWN STRL REUS W/ TWL XL LVL3 (GOWN DISPOSABLE) ×2 IMPLANT
GOWN STRL REUS W/TWL LRG LVL3 (GOWN DISPOSABLE)
GOWN STRL REUS W/TWL XL LVL3 (GOWN DISPOSABLE) ×2
HANDPIECE INTERPULSE COAX TIP (DISPOSABLE) ×1
HDLS TROCR DRIL PIN KNEE 75 (PIN) ×1
IMMOBILIZER KNEE 22 UNIV (SOFTGOODS) ×1 IMPLANT
IV NS 1000ML (IV SOLUTION) ×1
IV NS 1000ML BAXH (IV SOLUTION) ×1 IMPLANT
KIT BASIN OR (CUSTOM PROCEDURE TRAY) ×1 IMPLANT
KIT TURNOVER KIT B (KITS) ×1 IMPLANT
MANIFOLD NEPTUNE II (INSTRUMENTS) ×1 IMPLANT
NDL 18GX1X1/2 (RX/OR ONLY) (NEEDLE) IMPLANT
NEEDLE 18GX1X1/2 (RX/OR ONLY) (NEEDLE) IMPLANT
NS IRRIG 1000ML POUR BTL (IV SOLUTION) ×1 IMPLANT
PACK TOTAL JOINT (CUSTOM PROCEDURE TRAY) ×1 IMPLANT
PAD ARMBOARD 7.5X6 YLW CONV (MISCELLANEOUS) ×1 IMPLANT
PAD COLD SHLDR WRAP-ON (PAD) ×1 IMPLANT
PADDING CAST COTTON 6X4 STRL (CAST SUPPLIES) ×1 IMPLANT
PIN DRILL HDLS TROCAR 75 4PK (PIN) IMPLANT
SCREW FEMALE HEX FIX 25X2.5 (ORTHOPEDIC DISPOSABLE SUPPLIES) IMPLANT
SET HNDPC FAN SPRY TIP SCT (DISPOSABLE) ×1 IMPLANT
SET PAD KNEE POSITIONER (MISCELLANEOUS) ×1 IMPLANT
STAPLER VISISTAT 35W (STAPLE) ×1 IMPLANT
STEM POLY PAT PLY 32M KNEE (Knees) IMPLANT
STEM TIBIAL SZ6-7 EF 14 LT (Joint) IMPLANT
SUCTION FRAZIER HANDLE 10FR (MISCELLANEOUS) ×1
SUCTION TUBE FRAZIER 10FR DISP (MISCELLANEOUS) ×1 IMPLANT
SUT VIC AB 0 CT1 27 (SUTURE) ×1
SUT VIC AB 0 CT1 27XBRD ANBCTR (SUTURE) ×1 IMPLANT
SUT VIC AB 1 CT1 27 (SUTURE) ×2
SUT VIC AB 1 CT1 27XBRD ANBCTR (SUTURE) ×2 IMPLANT
SUT VIC AB 2-0 CT1 27 (SUTURE) ×2
SUT VIC AB 2-0 CT1 TAPERPNT 27 (SUTURE) ×2 IMPLANT
SYR 50ML LL SCALE MARK (SYRINGE) IMPLANT
TOWEL GREEN STERILE (TOWEL DISPOSABLE) ×1 IMPLANT
TOWEL GREEN STERILE FF (TOWEL DISPOSABLE) ×1 IMPLANT
TRAY CATH 16FR W/PLASTIC CATH (SET/KITS/TRAYS/PACK) IMPLANT

## 2022-06-12 NOTE — Anesthesia Preprocedure Evaluation (Addendum)
Anesthesia Evaluation  Patient identified by MRN, date of birth, ID band Patient awake    Reviewed: Allergy & Precautions, NPO status , Patient's Chart, lab work & pertinent test results  Airway Mallampati: II  TM Distance: >3 FB Neck ROM: Full    Dental  (+) Teeth Intact, Dental Advisory Given   Pulmonary former smoker   Pulmonary exam normal breath sounds clear to auscultation       Cardiovascular negative cardio ROS Normal cardiovascular exam Rhythm:Regular Rate:Normal     Neuro/Psych  PSYCHIATRIC DISORDERS Anxiety     negative neurological ROS     GI/Hepatic Neg liver ROS,GERD  Medicated,,History of malignant neoplasm of appendix   Endo/Other  Hypothyroidism    Renal/GU negative Renal ROS     Musculoskeletal  (+) Arthritis , Osteoarthritis,    Abdominal   Peds  Hematology negative hematology ROS (+) Plt 267k   Anesthesia Other Findings Day of surgery medications reviewed with the patient.  Reproductive/Obstetrics H/o ovarian cancer                              Anesthesia Physical Anesthesia Plan  ASA: 2  Anesthesia Plan: Spinal   Post-op Pain Management: Regional block* and Tylenol PO (pre-op)*   Induction: Intravenous  PONV Risk Score and Plan: 2 and TIVA, Treatment may vary due to age or medical condition, Dexamethasone and Ondansetron  Airway Management Planned: Natural Airway and Simple Face Mask  Additional Equipment:   Intra-op Plan:   Post-operative Plan:   Informed Consent: I have reviewed the patients History and Physical, chart, labs and discussed the procedure including the risks, benefits and alternatives for the proposed anesthesia with the patient or authorized representative who has indicated his/her understanding and acceptance.     Dental advisory given  Plan Discussed with: CRNA, Anesthesiologist and Surgeon  Anesthesia Plan Comments: (Discussed  risks and benefits of and differences between spinal and general. Discussed risks of spinal including headache, backache, failure, bleeding, infection, and nerve damage. Patient consents to spinal. Questions answered. Coagulation studies and platelet count acceptable.)       Anesthesia Quick Evaluation

## 2022-06-12 NOTE — Op Note (Signed)
Operative Note  Date of operation: 06/12/2022 Preoperative diagnosis: Left knee primary osteoarthritis Postoperative diagnosis: Same  Procedure: Left cemented total knee arthroplasty  Implants: Biomet/Zimmer persona knee system Implant Name Type Inv. Item Serial No. Manufacturer Lot No. LRB No. Used Action  CEMENT BONE R 1X40 - [id number] Cement CEMENT BONE R 1X40  [employer]IM[employer](ORTH,TRAU,BIO,SG) [id number] Left 1 Implanted  CEMENT BONE R 1X40 - [id number] Cement CEMENT BONE R 1X40  [employer](ORTH,TRAU,BIO,SG) [id number] Left 1 Implanted  COMP FEM CEMT PERSONA STD SZ7 - [id number] Knees COMP FEM CEMT PERSONA STD SZ7  [employer]MER [employer]ON(ORTH,TRAU,BIO,SG) [id number] Left 1 Implanted  STEM POLY PAT PLY 39M KNEE - [id number] Knees STEM POLY PAT PLY 39M KNEE  ZIMMER [employer]ON(ORTH,TRAU,BIO,SG) [id number] Left 1 Implanted  STEM TIBIAL SZ6-7 EF 14 LT - [id number] Joint STEM TIBIAL SZ6-7 EF 14 LT  ZIMMER [employer]ON(ORTH,TRAU,BIO,SG) [id number] Left 1 Implanted  persona 0  Keel  Cemented Tibia     [id number] Left 1 Implanted   Surgeon: Lind Guest. Ninfa Linden, MD Assistant: Benita Stabile, PA-C  Anesthesia: #1 left lower extremity adductor canal block, #2 spinal, #3 local Antibiotics: IV Ancef Tourniquet time: Less than 1 hour EBL: Less than 50 cc Complications: None  Indications: The patient is a 72 year old female with debilitating arthritis involving her left knee.  She is a very active individual and had some type of ACL repair from a tear done many years ago.  Over time this is because instability of her knee in terms of medial compartment where and the ACL repair has failed.  At this point she has bone-on-bone wear the medial compartment of the knee and a ligamentously unstable knee.  We have recommended a total knee arthroplasty.  The risk and benefits of the surgery been explained in detail and given her daily pain and the detrimental effect this is having on her quality of life combined with the knee pain and  instability, she does wish to proceed with a knee replacement as well.  We talked about the risk of acute blood loss anemia, nerve or vessel injury, fracture, infection, DVT, implant failure and wound healing issues.  She knows her goals are hopefully stable knee with decreased pain, improve mobility, and improve quality of life.  Procedure description: After informed consent was obtained and the appropriate left knee was marked, anesthesia obtained a left lower extremity adductor canal block in the holding room.  The patient was then brought to the operating room and set up on the operating table where spinal anesthesia was obtained.  She was then laid in supine position on the operating table and a Foley catheter was placed.  A nonsterile tourniquet placed around her upper left thigh and her left thigh, knee, leg, ankle and foot were prepped and draped with DuraPrep and sterile drapes.  A timeout was called and she identified as correct patient the correct left knee.  An Esmarch was then used to wrap out the leg and the tourniquet was inflated to 300 mm of pressure.  With the knee extended a direct midline incision was made over the patella and carried proximally distally.  Dissection was carried down to the knee joint and a medial parapatellar arthrotomy was carried out.  There was a moderate joint effusion seen.  With the knee in a flexed position we could see that the ACL was deficient with her left knee.  There was also complete wear of the cartilage on the medial compartment the knee and tearing of the medial meniscus.  Remnants  of the medial meniscus and lateral meniscus as well as ACL were then removed.  We then used an extramedullary cutting guide for making her proximal tibia cut correction for varus and valgus and a 7 degree slope.  We made this cut to take 2 mm off the high side and based on the cut we backed it down 2 more millimeters.  We then used a intramedullary guide for making her distal  femoral cut setting this for a left knee at 5 degrees externally rotated and a 10 mm distal femoral cut.  We made that cut without difficulty and brought the knee back down to full extension and she actually slightly hyperextended with a 10 mm block.  We then went back to the femur and put a femoral sizing guide based off the epicondylar axis.  Based off of this we chose a size 7 femur.  We put a 4-in-1 cutting block for a 7 femur and made her anterior and posterior cuts followed by her chamfer cuts.  We then backed the tibia and chose a size E left tibial tray for coverage over the tibial plateau setting the rotation of the tibial tubercle and the femur.  We then did our keel punch and drill hole off of this.  We then trialed our size E left tibia combined with our size 7 left CR standard femur.  We went up to a 14 mm medial congruent fixed-bearing polythene insert we are pleased with that trial for her left knee.  We then made a patella cut and drilled 3 holes for size 32 patella button.  With all trial instrumentation the knee again I put her through several cycles of motion and we replaced the range of motion and stability.  All transportation was then removed from the knee.  We irrigated the knee with normal saline solution using pulsatile lavage.  We then placed Marcaine with epinephrine around the arthrotomy.  The cement was mixed in with the knee in a flexed position we cemented our Biomet/Zimmer persona tibial tray for a left knee size E followed by cementing our size 7 left CR standard femur.  We placed our 14 mm medial congruent left fixed-bearing polythene insert and cemented our size 32 patella button.  We then held the knee compressed and fully extended while the cement hardened.  Once it hardened we again put the knee through several cycles of motion and removed excess cement debris from the knee.  The tourniquet was let down and hemostasis was obtained with electrocautery.  The arthrotomy was then  closed with interrupted #1 Vicryl suture followed by 0 Vicryl close the deep tissue and 2-0 Vicryl because subcutaneous tissue.  The skin was closed with staples.  Well-padded sterile dressings applied.  She was taken recovery in stable addition with all final counts being correct and no complications noted.  Benita Stabile, PA-C did assist during the entire case and beginning to end and his assistance was crucial and medically necessary for soft tissue retraction and management, helping guide implant placement and a layered closure of the wound.

## 2022-06-12 NOTE — Anesthesia Postprocedure Evaluation (Signed)
Anesthesia Post Note  Patient: Kristen Stephenson  Procedure(s) Performed: LEFT TOTAL KNEE ARTHROPLASTY (Left: Knee)     Patient location during evaluation: PACU Anesthesia Type: Regional, MAC and Spinal Level of consciousness: awake and alert and oriented Pain management: pain level controlled Vital Signs Assessment: post-procedure vital signs reviewed and stable Respiratory status: spontaneous breathing, nonlabored ventilation and respiratory function stable Cardiovascular status: blood pressure returned to baseline and stable Postop Assessment: no headache, no backache, spinal receding and no apparent nausea or vomiting Anesthetic complications: no   No notable events documented.  Last Vitals:  Vitals:   06/12/22 1759 06/12/22 1834  BP: 103/61 (!) 108/58  Pulse: 63 66  Resp: (!) 21   Temp: 36.8 C 37.4 C  SpO2: 94% 97%    Last Pain:  Vitals:   06/12/22 1834  TempSrc: Oral  PainSc:    Pain Goal: Patients Stated Pain Goal: 0 (06/12/22 1252)  LLE Motor Response: Purposeful movement (06/12/22 1828) LLE Sensation: Full sensation (06/12/22 1828) RLE Motor Response: Purposeful movement (06/12/22 1828) RLE Sensation: Decreased (06/12/22 1828)        Pervis Hocking

## 2022-06-12 NOTE — Transfer of Care (Signed)
Immediate Anesthesia Transfer of Care Note  Patient: Kristen Stephenson  Procedure(s) Performed: LEFT TOTAL KNEE ARTHROPLASTY (Left: Knee)  Patient Location: PACU  Anesthesia Type:MAC, Regional, and Spinal  Level of Consciousness: awake, alert , oriented, and patient cooperative  Airway & Oxygen Therapy: Patient Spontanous Breathing  Post-op Assessment: Report given to RN and Post -op Vital signs reviewed and stable  Post vital signs: Reviewed and stable  Last Vitals:  Vitals Value Taken Time  BP 91/50 06/12/22 1653  Temp    Pulse 62 06/12/22 1655  Resp 18 06/12/22 1655  SpO2 93 % 06/12/22 1655  Vitals shown include unvalidated device data.  Last Pain:  Vitals:   06/12/22 1410  TempSrc:   PainSc: 0-No pain      Patients Stated Pain Goal: 0 ([id number])  Complications: No notable events documented.

## 2022-06-12 NOTE — Anesthesia Procedure Notes (Signed)
Spinal  Patient location during procedure: OR Start time: 06/12/2022 3:05 PM End time: 06/12/2022 3:08 PM Reason for block: surgical anesthesia Staffing Performed: anesthesiologist  Anesthesiologist: Santa Lighter, MD Performed by: Santa Lighter, MD Authorized by: Santa Lighter, MD   Preanesthetic Checklist Completed: patient identified, IV checked, risks and benefits discussed, surgical consent, monitors and equipment checked, pre-op evaluation and timeout performed Spinal Block Patient position: sitting Prep: DuraPrep and site prepped and draped Patient monitoring: continuous pulse ox and blood pressure Approach: midline Location: L3-4 Injection technique: single-shot Needle Needle type: Pencan  Needle gauge: 24 G Assessment Events: CSF return Additional Notes Functioning IV was confirmed and monitors were applied. Sterile prep and drape, including hand hygiene, mask and sterile gloves were used. The patient was positioned and the spine was prepped. The skin was anesthetized with lidocaine.  Free flow of clear CSF was obtained prior to injecting local anesthetic into the CSF.  The spinal needle aspirated freely following injection.  The needle was carefully withdrawn.  The patient tolerated the procedure well. Consent was obtained prior to procedure with all questions answered and concerns addressed. Risks including but not limited to bleeding, infection, nerve damage, paralysis, failed block, inadequate analgesia, allergic reaction, high spinal, itching and headache were discussed and the patient wished to proceed.   Hoy Morn, MD

## 2022-06-12 NOTE — Interval H&P Note (Signed)
History and Physical Interval Note: The patient understands that she is here today for a left total knee replacement to treat her severe left knee arthritis.  There has been no acute or interval change in her medical status.  See H&P.  The risks and benefits of surgery been explained in detail and informed consent is obtained.  The left operative knee has been marked.  06/12/2022 2:17 PM  Kristen Stephenson  has presented today for surgery, with the diagnosis of OSTEOARTHRITIS / [place].  The various methods of treatment have been discussed with the patient and family. After consideration of risks, benefits and other options for treatment, the patient has consented to  Procedure(s): LEFT TOTAL KNEE ARTHROPLASTY (Left) as a surgical intervention.  The patient's history has been reviewed, patient examined, no change in status, stable for surgery.  I have reviewed the patient's chart and labs.  Questions were answered to the patient's satisfaction.     Mcarthur Rossetti

## 2022-06-12 NOTE — Anesthesia Procedure Notes (Signed)
Anesthesia Regional Block: Adductor canal block   Pre-Anesthetic Checklist: , timeout performed,  Correct Patient, Correct Site, Correct Laterality,  Correct Procedure, Correct Position, site marked,  Risks and benefits discussed,  Surgical consent,  Pre-op evaluation,  At surgeon's request and post-op pain management  Laterality: Left  Prep: chloraprep       Needles:  Injection technique: Single-shot  Needle Type: Echogenic Needle     Needle Length: 9cm  Needle Gauge: 21     Additional Needles:   Procedures:,,,, ultrasound used (permanent image in chart),,    Narrative:  Start time: 06/12/2022 1:53 PM End time: 06/12/2022 2:01 PM Injection made incrementally with aspirations every 5 mL.  Performed by: Personally  Anesthesiologist: Santa Lighter, MD  Additional Notes: No pain on injection. No increased resistance to injection. Injection made in 5cc increments.  Good needle visualization.  Patient tolerated procedure well.

## 2022-06-12 NOTE — Plan of Care (Signed)

## 2022-06-13 DIAGNOSIS — R531 Weakness: Secondary | ICD-10-CM | POA: Diagnosis not present

## 2022-06-13 DIAGNOSIS — M1712 Unilateral primary osteoarthritis, left knee: Secondary | ICD-10-CM | POA: Diagnosis not present

## 2022-06-13 DIAGNOSIS — Z96652 Presence of left artificial knee joint: Secondary | ICD-10-CM | POA: Diagnosis not present

## 2022-06-13 DIAGNOSIS — R2689 Other abnormalities of gait and mobility: Secondary | ICD-10-CM | POA: Diagnosis not present

## 2022-06-13 LAB — CBC
HCT: 37.8 % (ref 36.0–46.0)
Hemoglobin: 12.1 g/dL (ref 12.0–15.0)
MCH: 30 pg (ref 26.0–34.0)
MCHC: 32 g/dL (ref 30.0–36.0)
MCV: 93.8 fL (ref 80.0–100.0)
Platelets: 224 10*3/uL (ref 150–400)
RBC: 4.03 MIL/uL (ref 3.87–5.11)
RDW: 12.7 % (ref 11.5–15.5)
WBC: 7.7 10*3/uL (ref 4.0–10.5)
nRBC: 0 % (ref 0.0–0.2)

## 2022-06-13 LAB — BASIC METABOLIC PANEL
Anion gap: 9 (ref 5–15)
BUN: 13 mg/dL (ref 8–23)
CO2: 27 mmol/L (ref 22–32)
Calcium: 9.1 mg/dL (ref 8.9–10.3)
Chloride: 105 mmol/L (ref 98–111)
Creatinine, Ser: 0.83 mg/dL (ref 0.44–1.00)
GFR, Estimated: >60 mL/min (ref >60)
Glucose, Bld: 196 mg/dL — ABNORMAL HIGH (ref 70–99)
Potassium: 3.9 mmol/L (ref 3.5–5.1)
Sodium: 141 mmol/L (ref 135–145)

## 2022-06-13 MED ORDER — OXYCODONE HCL 5 MG PO TABS: 5.0000 mg | ORAL_TABLET | ORAL | 0 refills | Status: DC | PRN

## 2022-06-13 MED ORDER — METHOCARBAMOL 500 MG PO TABS: 500.0000 mg | ORAL_TABLET | Freq: Four times a day (QID) | ORAL | 1 refills | Status: DC | PRN

## 2022-06-13 MED ORDER — ONDANSETRON HCL 4 MG PO TABS: 4.0000 mg | ORAL_TABLET | Freq: Four times a day (QID) | ORAL | 0 refills | Status: DC | PRN

## 2022-06-13 MED ORDER — ACETAMINOPHEN 325 MG PO TABS: 325.0000 mg | ORAL_TABLET | Freq: Four times a day (QID) | ORAL | 1 refills | Status: DC | PRN

## 2022-06-13 MED ORDER — ASPIRIN 81 MG PO CHEW: 81.0000 mg | CHEWABLE_TABLET | Freq: Two times a day (BID) | ORAL | 1 refills | Status: DC

## 2022-06-13 NOTE — Progress Notes (Signed)
Discharge instructions reviewed with pt and her husband.  Copy of instructions given to pt. Informed scripts sent to her pharmacy for pick up.  Questions answered. Both verbalized understanding of instructions. Pt's RW has already been delivered to the room and will go with pt.  Ice Man machine going home with pt also for continued use at home.  Pt to be d/c'd via wheelchair with belongings, with husband.           To be escorted by staff or hospital volunteer.

## 2022-06-13 NOTE — Discharge Summary (Signed)
Patient ID: Kristen Stephenson MRN: [id number] DOB/AGE: September 28, 1950 72 y.o.  Admit date: 06/12/2022 Discharge date: 06/13/2022  Admission Diagnoses:  Principal Problem:   Unilateral primary osteoarthritis, left knee Active Problems:   Status post total left knee replacement   Discharge Diagnoses:  Same  Past Medical History:  Diagnosis Date   Adjustment disorder with anxiety    Anxiety    Arthritis    knee,left   Barrett's esophagus    [facility] Provider Based Lic Grp-Needs Endoscopy in 2023   Cancer ([place])    cervix   Cataract    beginning   GERD (gastroesophageal reflux disease)    H/O ovarian cancer    Heart murmur    History of malignant neoplasm of appendix    Hyperlipidemia    Hyperlipidemia    Lung nodule    Osteopenia    Osteopenia after menopause 09/01/2019   dexa 08/2019   Ovarian cancer ([place]) 2018   Subclinical hypothyroidism 06/15/2019    Surgeries: Procedure(s): LEFT TOTAL KNEE ARTHROPLASTY on 06/12/2022   Consultants:   Discharged Condition: Improved  Hospital Course: Kristen Stephenson is an 72 y.o. female who was admitted 06/12/2022 for operative treatment ofUnilateral primary osteoarthritis, left knee. Patient has severe unremitting pain that affects sleep, daily activities, and work/hobbies. After pre-op clearance the patient was taken to the operating room on 06/12/2022 and underwent  Procedure(s): LEFT TOTAL KNEE ARTHROPLASTY.    Patient was given perioperative antibiotics:  Anti-infectives (From admission, onward)    Start     Dose/Rate Route Frequency Ordered Stop   06/13/22 0600  ceFAZolin (ANCEF) IVPB 2g/100 mL premix        2 g 200 mL/hr over 30 Minutes Intravenous On call to O.R. 06/12/22 1234 06/12/22 1511   06/12/22 2100  ceFAZolin (ANCEF) IVPB 1 g/50 mL premix        1 g 100 mL/hr over 30 Minutes Intravenous Every 6 hours 06/12/22 1755 06/13/22 0352        Patient was given sequential compression devices, early ambulation, and  chemoprophylaxis to prevent DVT.  Patient benefited maximally from hospital stay and there were no complications.    Recent vital signs: Patient Vitals for the past 24 hrs:  BP Temp Temp src Pulse Resp SpO2 Height Weight  06/13/22 0800 114/70 97.8 F (36.6 C) Oral (!) 55 15 96 % -- --  06/13/22 0415 109/72 97.8 F (36.6 C) Oral 62 16 97 % -- --  06/12/22 2011 113/74 97.6 F (36.4 C) -- 66 18 95 % -- --  06/12/22 1834 (!) 108/58 99.3 F (37.4 C) Oral 66 -- 97 % -- --  06/12/22 1759 103/61 98.2 F (36.8 C) -- 63 (!) 21 94 % -- --  06/12/22 1745 103/61 -- -- (!) 55 18 93 % -- --  06/12/22 1730 (!) 102/59 -- -- (!) 58 17 92 % -- --  06/12/22 1715 99/66 -- -- 61 16 94 % -- --  06/12/22 1700 (!) 93/59 -- -- 62 19 93 % -- --  06/12/22 1652 (!) 91/50 98.1 F (36.7 C) -- 78 16 93 % -- --  06/12/22 1435 94/67 -- -- (!) 53 12 97 % -- --  06/12/22 1430 92/70 -- -- (!) 55 18 97 % -- --  06/12/22 1425 104/70 -- -- (!) 58 17 97 % -- --  06/12/22 1420 99/72 -- -- (!) 55 17 97 % -- --  06/12/22 1415 95/82 -- -- (!) 59 13 97 % -- --  06/12/22 1410 114/70 -- -- 60 13 97 % -- --  06/12/22 1405 96/66 -- -- (!) 56 15 96 % -- --  06/12/22 1400 109/72 -- -- 60 14 95 % -- --  06/12/22 1355 115/88 -- -- 61 17 96 % -- --  06/12/22 1247 111/87 98.4 F (36.9 C) Oral 73 18 96 % '5\' 4"'$  (1.626 m) 65.8 kg     Recent laboratory studies:  Recent Labs    06/13/22 0405  WBC 7.7  HGB 12.1  HCT 37.8  PLT 224  NA 141  K 3.9  CL 105  CO2 27  BUN 13  CREATININE 0.83  GLUCOSE 196*  CALCIUM 9.1     Discharge Medications:   Allergies as of 06/13/2022   No Known Allergies      Medication List     TAKE these medications    acetaminophen 325 MG tablet Commonly known as: TYLENOL Take 1-2 tablets (325-650 mg total) by mouth every 6 (six) hours as needed for mild pain (pain score 1-3 or temp > 100.5).   aspirin 81 MG chewable tablet Chew 1 tablet (81 mg total) by mouth 2 (two) times daily.    CALCIUM 600 + D PO Take 1 tablet by mouth daily.   cyanocobalamin 1000 MCG tablet Commonly known as: VITAMIN B12 Take 1,000 mcg by mouth daily.   gabapentin 100 MG capsule Commonly known as: NEURONTIN Take 2 capsules (200 mg total) by mouth 2 (two) times daily.   methocarbamol 500 MG tablet Commonly known as: ROBAXIN Take 1 tablet (500 mg total) by mouth every 6 (six) hours as needed for muscle spasms.   omeprazole 20 MG capsule Commonly known as: PRILOSEC Take 20 mg by mouth daily.   ondansetron 4 MG tablet Commonly known as: ZOFRAN Take 1 tablet (4 mg total) by mouth every 6 (six) hours as needed for nausea.   oxyCODONE 5 MG immediate release tablet Commonly known as: Oxy IR/ROXICODONE Take 1-2 tablets (5-10 mg total) by mouth every 4 (four) hours as needed for moderate pain (pain score 4-6).   simvastatin 20 MG tablet Commonly known as: ZOCOR Take 1 tablet (20 mg total) by mouth at bedtime.   Vitamin D3 50 MCG (2000 UT) Caps Generic drug: Cholecalciferol Take 2,000 Units by mouth daily.               Durable Medical Equipment  (From admission, onward)           Start     Ordered   06/12/22 1811  DME 3 n 1  Once        06/12/22 1810   06/12/22 1811  DME Walker rolling  Once       Question Answer Comment  Walker: With 5 Inch Wheels   Patient needs a walker to treat with the following condition Status post total left knee replacement      06/12/22 1810            Diagnostic Studies: DG Knee Left Port  Result Date: 06/12/2022 CLINICAL DATA:  Status post left knee replacement, initial encounter EXAM: PORTABLE LEFT KNEE - 2 VIEW COMPARISON:  None Available. FINDINGS: Left knee prosthesis is seen in satisfactory position. No acute fracture or dislocation is noted. No acute soft tissue abnormality is noted. IMPRESSION: Status post left knee replacement. Electronically Signed   By: Inez Catalina M.D.   On: 06/12/2022 19:19    Disposition: Discharge  disposition: 01-Home or Self Care  Follow-up Information     Mcarthur Rossetti, MD Follow up in 2 week(s).   Specialty: Orthopedic Surgery Contact information: [address] [place] [address] [phone]                  Signed: Mcarthur Rossetti 06/13/2022, 12:24 PM

## 2022-06-13 NOTE — Plan of Care (Signed)
  Problem: Education: Goal: Knowledge of General Education information will improve Description Including pain rating scale, medication(s)/side effects and non-pharmacologic comfort measures Outcome: Completed/Met   Problem: Health Behavior/Discharge Planning: Goal: Ability to manage health-related needs will improve Outcome: Completed/Met   Problem: Clinical Measurements: Goal: Ability to maintain clinical measurements within normal limits will improve Outcome: Completed/Met Goal: Will remain free from infection Outcome: Completed/Met Goal: Diagnostic test results will improve Outcome: Completed/Met Goal: Respiratory complications will improve Outcome: Completed/Met Goal: Cardiovascular complication will be avoided Outcome: Completed/Met   Problem: Activity: Goal: Risk for activity intolerance will decrease Outcome: Completed/Met   Problem: Nutrition: Goal: Adequate nutrition will be maintained Outcome: Completed/Met   Problem: Coping: Goal: Level of anxiety will decrease Outcome: Completed/Met   Problem: Elimination: Goal: Will not experience complications related to bowel motility Outcome: Completed/Met Goal: Will not experience complications related to urinary retention Outcome: Completed/Met   Problem: Pain Managment: Goal: General experience of comfort will improve Outcome: Completed/Met   Problem: Safety: Goal: Ability to remain free from injury will improve Outcome: Completed/Met   Problem: Skin Integrity: Goal: Risk for impaired skin integrity will decrease Outcome: Completed/Met   Problem: Education: Goal: Knowledge of the prescribed therapeutic regimen will improve Outcome: Completed/Met Goal: Individualized Educational Video(s) Outcome: Completed/Met   Problem: Activity: Goal: Ability to avoid complications of mobility impairment will improve Outcome: Completed/Met Goal: Range of joint motion will improve Outcome: Completed/Met   Problem:  Clinical Measurements: Goal: Postoperative complications will be avoided or minimized Outcome: Completed/Met   Problem: Pain Management: Goal: Pain level will decrease with appropriate interventions Outcome: Completed/Met   Problem: Skin Integrity: Goal: Will show signs of wound healing Outcome: Completed/Met   

## 2022-06-13 NOTE — Progress Notes (Signed)
Physical Therapy Evaluation Patient Details Name: Kristen Stephenson MRN: [id number] DOB: 1950/08/05 Today's Date: 06/13/2022  History of Present Illness  72 yo female with onset of failed conservative management of L knee OA has been admitted on 3/5 for TKA.  Pt is recent outpt PT pt for back pain.  PMHx:  sciatica, spondylosis with myelopathy, L side LBP, OA, anxiety, GERD, cervical CA, ovarian CA, HLD, heart murmur, cataracts, osteopenia, hypothyroidism, lung nodule,  Clinical Impression  Pt was seen for mobility on RW in her room, noted BP was a potential issue as pt was light headed initially but checked as follows:  supine 114/60, sitting 121/74, standing 119/71, post walk 115/76.  Pt is feeling better after moving and assisted to chair with instruction on basic exercises for L knee.  Ice applied and pt was in chair with call light, nursing notified.  Follow up as MD recommends for PT.       Recommendations for follow up therapy are one component of a multi-disciplinary discharge planning process, led by the attending physician.  Recommendations may be updated based on patient status, additional functional criteria and insurance authorization.  Follow Up Recommendations Follow physician's recommendations for discharge plan and follow up therapies      Assistance Recommended at Discharge Intermittent Supervision/Assistance  Patient can return home with the following  A little help with walking and/or transfers;A little help with bathing/dressing/bathroom;Assistance with cooking/housework;Assist for transportation;Help with stairs or ramp for entrance    Equipment Recommendations Rolling walker (2 wheels);BSC/3in1  Recommendations for Other Services       Functional Status Assessment Patient has had a recent decline in their functional status and demonstrates the ability to make significant improvements in function in a reasonable and predictable amount of time.     Precautions /  Restrictions Precautions Precautions: Fall Precaution Comments: LLE maintaining minor numbness Restrictions Weight Bearing Restrictions: Yes RLE Weight Bearing: Weight bearing as tolerated      Mobility  Bed Mobility Overal bed mobility: Needs Assistance Bed Mobility: Supine to Sit     Supine to sit: Min assist     General bed mobility comments: min assist mainly for RLE    Transfers Overall transfer level: Needs assistance Equipment used: Rolling walker (2 wheels), 1 person hand held assist Transfers: Sit to/from Stand Sit to Stand: Min guard           General transfer comment: min guard to steady her    Ambulation/Gait Ambulation/Gait assistance: Min guard Gait Distance (Feet): 30 Feet Assistive device: Rolling walker (2 wheels) Gait Pattern/deviations: Step-to pattern, Step-through pattern, Decreased stride length, Decreased weight shift to left Gait velocity: reduced Gait velocity interpretation: <1.31 ft/sec, indicative of household ambulator Pre-gait activities: pt is up to move with help to find steady balance and then progress General Gait Details: pt is using walker carefully to avoid excessive pressure on LLE well  Stairs            Wheelchair Mobility    Modified Rankin (Stroke Patients Only)       Balance Overall balance assessment: Needs assistance   Sitting balance-Leahy Scale: Good     Standing balance support: Bilateral upper extremity supported, During functional activity Standing balance-Leahy Scale: Poor Standing balance comment: with walker is closer to fair balance                             Pertinent Vitals/Pain Pain Assessment Pain Assessment: Faces  Faces Pain Scale: Hurts little more Pain Location: L knee joint Pain Descriptors / Indicators: Guarding, Operative site guarding Pain Intervention(s): Monitored during session, Repositioned, Ice applied    Home Living Family/patient expects to be discharged  to:: Private residence Living Arrangements: Spouse/significant other Available Help at Discharge: Family;Available 24 hours/day Type of Home: House Home Access: Stairs to enter Entrance Stairs-Rails: Can reach both Entrance Stairs-Number of Steps: 1 (threshhold)   Home Layout: One level Home Equipment:  (walking sticks (hiking equipment)) Additional Comments: pt has been avoiding an AD with hiking equipment    Prior Function Prior Level of Function : Independent/Modified Independent             Mobility Comments: walking poles       Hand Dominance   Dominant Hand: Right    Extremity/Trunk Assessment   Upper Extremity Assessment Upper Extremity Assessment: Overall WFL for tasks assessed    Lower Extremity Assessment Lower Extremity Assessment: LLE deficits/detail LLE Deficits / Details: new TKA with 90 deg flexion actively LLE Sensation: decreased light touch LLE Coordination: decreased gross motor    Cervical / Trunk Assessment Cervical / Trunk Assessment: Normal  Communication   Communication: No difficulties  Cognition Arousal/Alertness: Awake/alert Behavior During Therapy: WFL for tasks assessed/performed Overall Cognitive Status: Within Functional Limits for tasks assessed                                 General Comments: pt is on task and appropriate but reports her memory is a bit off, maybe meds        General Comments General comments (skin integrity, edema, etc.): Pt is up to move with help on RW and limited distance mainly over pt having some residual joint numbness from surgery.  Pt had ice replaced on knee with comfort from the modality    Exercises Total Joint Exercises Ankle Circles/Pumps: AROM, 5 reps Quad Sets: AROM, 10 reps Gluteal Sets: AROM, 10 reps   Assessment/Plan    PT Assessment Patient needs continued PT services  PT Problem List Decreased strength;Decreased range of motion;Decreased activity tolerance;Decreased  balance;Decreased coordination;Decreased skin integrity;Pain       PT Treatment Interventions DME instruction;Gait training;Stair training;Functional mobility training;Therapeutic activities;Therapeutic exercise;Balance training;Neuromuscular re-education;Patient/family education    PT Goals (Current goals can be found in the Care Plan section)  Acute Rehab PT Goals Patient Stated Goal: to walk and get home, be independent again PT Goal Formulation: With patient Time For Goal Achievement: 06/20/22 Potential to Achieve Goals: Good    Frequency 7X/week     Co-evaluation               AM-PAC PT "6 Clicks" Mobility  Outcome Measure Help needed turning from your back to your side while in a flat bed without using bedrails?: None Help needed moving from lying on your back to sitting on the side of a flat bed without using bedrails?: A Little Help needed moving to and from a bed to a chair (including a wheelchair)?: A Little Help needed standing up from a chair using your arms (e.g., wheelchair or bedside chair)?: A Little Help needed to walk in hospital room?: A Little Help needed climbing 3-5 steps with a railing? : A Lot 6 Click Score: 18    End of Session Equipment Utilized During Treatment: Gait belt Activity Tolerance: Patient limited by fatigue;Treatment limited secondary to medical complications (Comment) Patient left: in chair;with call  bell/phone within reach;with chair alarm set Nurse Communication: Mobility status PT Visit Diagnosis: Unsteadiness on feet (R26.81);Pain;Muscle weakness (generalized) (M62.81) Pain - Right/Left: Left Pain - part of body: Knee    Time: [id number] PT Time Calculation (min) (ACUTE ONLY): 36 min   Charges:   PT Evaluation $PT Eval Moderate Complexity: 1 Mod PT Treatments $Gait Training: 8-22 mins       Ramond Dial 06/13/2022, 1:15 PM  Mee Hives, PT PhD Acute Rehab Dept. Number: [place]

## 2022-06-13 NOTE — Discharge Instructions (Signed)

## 2022-06-13 NOTE — TOC Transition Note (Addendum)
Transition of Care [facility]) - CM/SW Discharge Note   Patient Details  Name: Kristen Stephenson MRN: [id number] Date of Birth: [birth year]  [facility] of [facility]) CM/SW Contact:  Sharin Mons, RN Phone Number: 06/13/2022, 12:07 PM   Clinical Narrative:    Patient will DC to: Anticipated DC date: Family notified: Transport by:         S/p Left  total knee arthroplasty. 3/5  Per MD patient ready for DC today . RN, patient, patient's family, and [place] notified of DC. Husband will assist with care once d/c. Referral for RW made with [employer]. Equipment will be delivered to bedside prior to d/c. Pt declined BSC. Pt without RX med concerns. Post hospital f/u noted on AVS. Husband to provide transportation to home.   RNCM will sign off for now as intervention is no longer needed. Please consult [place] again if new needs arise.    Final next level of care: [place] Barriers to Discharge: No Barriers Identified   Patient Goals and CMS Choice   Choice offered to / list presented to : Patient  Discharge Placement                         Discharge Plan and Services Additional resources added to the After Visit Summary for                  DME Arranged: Walker rolling DME Agency: [employer] Date DME Agency Contacted: 06/13/22 Time DME Agency Contacted: 1206 Representative spoke with at [place]: [place]: [place] Determinants of Health ([place]) Interventions SDOH Screenings   Food Insecurity: No Food Insecurity (07/28/2021)  Housing: Low Risk  (07/28/2021)  Transportation Needs: No Transportation Needs (07/28/2021)  Depression (PHQ2-9): Low Risk  (05/03/2022)  Financial Resource Strain: Low Risk  (07/28/2021)  Physical Activity: Sufficiently Active (07/28/2021)  Social Connections: Socially Integrated (07/28/2021)  Stress: No Stress Concern Present (07/28/2021)  Tobacco Use: Medium Risk  (06/12/2022)     Readmission Risk Interventions     No data to display

## 2022-06-13 NOTE — Progress Notes (Signed)
Subjective: 1 Day Post-Op Procedure(s) (LRB): LEFT TOTAL KNEE ARTHROPLASTY (Left) Patient reports pain as mild.  Nausea this AM.   Objective: Vital signs in last 24 hours: Temp:  [97.6 F (36.4 C)-99.3 F (37.4 C)] 97.8 F (36.6 C) (03/06 0800) Pulse Rate:  [53-78] 55 (03/06 0800) Resp:  [12-21] 15 (03/06 0800) BP: (91-115)/(50-88) 114/70 (03/06 0800) SpO2:  [92 %-97 %] 96 % (03/06 0800) Weight:  [65.8 kg] 65.8 kg (03/05 1247)  Intake/Output from previous day: 03/05 0701 - 03/06 0700 In: 1000 [I.V.:1000] Out: 400 [Urine:300; Blood:100] Intake/Output this shift: No intake/output data recorded.  Recent Labs    06/13/22 0405  HGB 12.1   Recent Labs    06/13/22 0405  WBC 7.7  RBC 4.03  HCT 37.8  PLT 224   Recent Labs    06/13/22 0405  NA 141  K 3.9  CL 105  CO2 27  BUN 13  CREATININE 0.83  GLUCOSE 196*  CALCIUM 9.1   No results for input(s): "LABPT", "INR" in the last 72 hours.  Sensation intact distally Intact pulses distally Dorsiflexion/Plantar flexion intact Incision: dressing C/D/I Compartment soft   Assessment/Plan: 1 Day Post-Op Procedure(s) (LRB): LEFT TOTAL KNEE ARTHROPLASTY (Left) Up with therapy Discharge home with home health      Kristen Stephenson 06/13/2022, 8:39 AM

## 2022-06-14 ENCOUNTER — Encounter: Payer: Self-pay | Admitting: Radiology

## 2022-06-14 ENCOUNTER — Encounter (HOSPITAL_COMMUNITY): Payer: Self-pay | Admitting: Orthopaedic Surgery

## 2022-06-15 ENCOUNTER — Telehealth: Payer: Self-pay | Admitting: Orthopaedic Surgery

## 2022-06-15 ENCOUNTER — Other Ambulatory Visit: Payer: Self-pay | Admitting: Orthopaedic Surgery

## 2022-06-15 MED ORDER — OXYCODONE HCL 5 MG PO TABS: 5.0000 mg | ORAL_TABLET | ORAL | 0 refills | Status: DC | PRN

## 2022-06-15 NOTE — Telephone Encounter (Signed)
Please advise 

## 2022-06-15 NOTE — Telephone Encounter (Signed)
Patient will need a refill on Oxycodone please advise

## 2022-06-18 ENCOUNTER — Telehealth: Payer: Self-pay | Admitting: *Deleted

## 2022-06-18 NOTE — Telephone Encounter (Signed)
Ortho bundle D/C call on 06/14/22 (late entry).

## 2022-06-19 ENCOUNTER — Encounter: Payer: PPO | Admitting: Physical Therapy

## 2022-06-19 ENCOUNTER — Other Ambulatory Visit: Payer: Self-pay

## 2022-06-19 ENCOUNTER — Telehealth: Payer: Self-pay | Admitting: Orthopaedic Surgery

## 2022-06-19 DIAGNOSIS — Z96652 Presence of left artificial knee joint: Secondary | ICD-10-CM

## 2022-06-19 NOTE — Telephone Encounter (Signed)
Patient called in stating she does not want the needing she would rather have more Rehab PT please send order to [facility] Fax # [phone]

## 2022-06-19 NOTE — Telephone Encounter (Signed)
Order for knee HHPT sent to [facility]

## 2022-06-21 ENCOUNTER — Encounter: Payer: PPO | Admitting: Physical Therapy

## 2022-06-25 ENCOUNTER — Ambulatory Visit (INDEPENDENT_AMBULATORY_CARE_PROVIDER_SITE_OTHER): Payer: PPO | Admitting: Orthopaedic Surgery

## 2022-06-25 ENCOUNTER — Encounter: Payer: Self-pay | Admitting: Orthopaedic Surgery

## 2022-06-25 ENCOUNTER — Telehealth: Payer: Self-pay | Admitting: *Deleted

## 2022-06-25 ENCOUNTER — Other Ambulatory Visit: Payer: Self-pay | Admitting: *Deleted

## 2022-06-25 DIAGNOSIS — Z96652 Presence of left artificial knee joint: Secondary | ICD-10-CM

## 2022-06-25 MED ORDER — HYDROCODONE-ACETAMINOPHEN 5-325 MG PO TABS: 1.0000 | ORAL_TABLET | Freq: Four times a day (QID) | ORAL | 0 refills | Status: DC | PRN

## 2022-06-25 NOTE — Progress Notes (Signed)
The patient is here for first postoperative visit status post a left total knee arthroplasty 2 weeks ago.  She is doing well overall.  On exam her extension is almost full and her flexion is to just past 90 degrees to about 95 degrees.  This is been with home therapy and she is ready to transition to outpatient physical therapy.  She is still needing to ambulate with a walker when she is out and about.  She has been compliant with a baby aspirin twice daily as well as wearing compressive garments.  On exam her calf is soft.  She has no significant foot and ankle swelling.  We will send in hydrocodone for pain.  She did have a rash that may have been contributed to oxycodone.  She still needs a pain medication to help her get through therapy.   I would like to see her back in 4 weeks to see how she is doing overall clinically but no x-rays are needed.

## 2022-06-25 NOTE — Telephone Encounter (Signed)
Ortho bundle 14 day in office meeting completed. °

## 2022-06-26 ENCOUNTER — Ambulatory Visit: Payer: PPO | Attending: Orthopaedic Surgery | Admitting: Physical Therapy

## 2022-06-26 ENCOUNTER — Encounter: Payer: PPO | Admitting: Physical Therapy

## 2022-06-26 DIAGNOSIS — Z96652 Presence of left artificial knee joint: Secondary | ICD-10-CM | POA: Diagnosis not present

## 2022-06-26 DIAGNOSIS — G8929 Other chronic pain: Secondary | ICD-10-CM | POA: Insufficient documentation

## 2022-06-26 DIAGNOSIS — M25562 Pain in left knee: Secondary | ICD-10-CM

## 2022-06-26 DIAGNOSIS — M5442 Lumbago with sciatica, left side: Secondary | ICD-10-CM | POA: Diagnosis not present

## 2022-06-26 NOTE — Therapy (Signed)
OUTPATIENT PHYSICAL THERAPY LOWER EXTREMITY EVALUATION   Patient Name: Kristen Stephenson MRN: [id number] DOB:31-Dec-1950, 72 y.o., female Today's Date: 06/26/2022  END OF SESSION:  PT End of Session - 06/26/22 1256     Visit Number 1    Number of Visits 17    Date for PT Re-Evaluation 09/07/22    Authorization - Visit Number 1    Authorization - Number of Visits 10    Progress Note Due on Visit 10    PT Start Time 1301    PT Stop Time 1344    PT Time Calculation (min) 43 min    Activity Tolerance Patient limited by fatigue;Treatment limited secondary to medical complications (Comment)    Behavior During Therapy [facility] for tasks assessed/performed             Past Medical History:  Diagnosis Date   Adjustment disorder with anxiety    Anxiety    Arthritis    knee,left   Barrett's esophagus    [facility] Provider Based Lic [facility]-Needs Endoscopy in 2023   Cancer ([place])    cervix   Cataract    beginning   GERD (gastroesophageal reflux disease)    H/O ovarian cancer    Heart murmur    History of malignant neoplasm of appendix    Hyperlipidemia    Hyperlipidemia    Lung nodule    Osteopenia    Osteopenia after menopause 09/01/2019   dexa 08/2019   Ovarian cancer ([place]) 2018   Subclinical hypothyroidism 06/15/2019   Past Surgical History:  Procedure Laterality Date   ABDOMINAL HYSTERECTOMY     ANKLE SURGERY  2003   APPENDECTOMY  2018   Cancer/S/P resection and chemo   ARTHROSCOPIC REPAIR ACL Left    BREAST EXCISIONAL BIOPSY     CYST EXCISION Right 1972   Right breast/benign   FEMUR FRACTURE SURGERY Right    Plate with 5 screws   INGUINAL HERNIA REPAIR  11/2007   TONSILLECTOMY     TOTAL KNEE ARTHROPLASTY Left 06/12/2022   Procedure: LEFT TOTAL KNEE ARTHROPLASTY;  Surgeon: Mcarthur Rossetti, MD;  Location: [place];  Service: Orthopedics;  Laterality: Left;   VULVAR LESION REMOVAL  07/2007   benign   Patient Active Problem List   Diagnosis Date Noted   Status post  total left knee replacement 06/12/2022   Vitamin B12 deficiency 05/14/2022   Long-term current use of proton pump inhibitor therapy 05/03/2022   Lumbar and sacral spondyloarthritis 05/03/2022   Barrett's esophagus without dysplasia 04/19/2021   Postcoital UTI - recurrent 02/03/2020   Pulmonary nodules 09/23/2019   Presbycusis of both ears 09/23/2019   Osteopenia after menopause 09/01/2019   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   Anxiety 09/16/2018   History of ovarian cancer 09/16/2018   Mixed hyperlipidemia 09/16/2018   GERD (gastroesophageal reflux disease) 09/16/2018    PCP: Rosendo Gros MD  REFERRING PROVIDER: Ninfa Linden MD  REFERRING DIAG: L TKA  THERAPY DIAG:  Acute pain of left knee  Rationale for Evaluation and Treatment: Rehabilitation  ONSET DATE: 06/12/22  SUBJECTIVE:   SUBJECTIVE STATEMENT: S/p L TKA 3/5/2  PERTINENT HISTORY: Pt is a 72 year old female presenting s/p L TKA 06/12/22. Pt reports ACL repair 10years ago, that "failed" causing her so seek TKA. Prior to TKA was having trouble completing hiking with her husband without falling from "my knee would give out". Pt had HHPT for a 4days post op. Reports completing heel slides, prop stretch, standing hip  abd and ext, standing on 1 leg, and attempting LAQ. Pt is taking pain medication as prescribed with tylenol as needed. Current and best pain 1/10; worst 6/10. Pt is completing bathing and dressing modi; has walk in shower with grab bars. Husband is completing cooking and cleaning. Husband drives currently as well. Pt is using a walking stick currently and RW  in community- PLOF ind without AD. Pt is retired, enjoys hiking 3-13miles and road biking 5-40miles 3x/week with her husband. Pt has a trip to [place] in September planned 69miles in 5days. Pt denies N/V, B&B changes, unexplained weight fluctuation, saddle paresthesia, fever, night sweats, or unrelenting night pain at this time.   PAIN:  Are you having  pain? Yes: NPRS scale: 1/10 Pain location: L knee Pain description: dull, achy Aggravating factors: moving the knee at all, standing Relieving factors: ice machine, and pain medication.   PRECAUTIONS: None  WEIGHT BEARING RESTRICTIONS: No  FALLS:  Has patient fallen in last 6 months? Yes. Number of falls 6 with L knee giving out on hiking trail  LIVING ENVIRONMENT: Lives with: lives with their spouse Lives in: House/apartment Stairs: No Has following equipment at home: Single point cane; RW  OCCUPATION: Retired  PLOF: Independent  PATIENT GOALS: Get back to hiking and biking  NEXT MD VISIT: 07/26/22  OBJECTIVE:   DIAGNOSTIC FINDINGS: none  PATIENT SURVEYS:  FOTO 49; goal 90  COGNITION: Overall cognitive status: Within functional limits for tasks assessed     SENSATION: WFL  EDEMA:  Circumferential: mid patella L43cm R36cm   Muscle length WNL bilat  POSTURE: weight shift right R knee hyperext, "hanging on Y ligament: posture  PALPATION: TTP grossly at L knee; minimal patellar motion all directions  LOWER EXTREMITY ROM:  Active /PROMROM Right eval Left eval  Hip flexion All bilat hip mobility WNL  Hip extension   Hip abduction   Hip adduction   Hip internal rotation   Hip external rotation   Knee flexion 150 94/101  Knee extension -5 4/0  Ankle dorsiflexion    Ankle plantarflexion    Ankle inversion    Ankle eversion     (Blank rows = not tested)  LOWER EXTREMITY MMT:  MMT Right eval Left eval  Hip flexion 4+ 4  Hip extension 4+ 4+  Hip abduction 4+ 4-  Hip adduction    Hip internal rotation 5   Hip external rotation 4+   Knee flexion 5   Knee extension 5   Ankle dorsiflexion 5 5  Ankle plantarflexion    Ankle inversion    Ankle eversion     (Blank rows = not tested)  1RM RLE knee ext 25# knee flex 10# but limited by LLE pain- would be good to retest   FUNCTIONAL TESTS:  5 times sit to stand: 15sec 10 meter walk test: without  AD: self selected: 0.61m/s; fastest 0.47m/s  SLS RLE: 62sec LLE: 18sec  GAIT: Distance walked: 61M Assistive device utilized:  walking stick Level of assistance: SBA Comments: L knee hyperext with stance phase, decreased R step length   TODAY'S TREATMENT:  DATE: 06/26/22 PT reviewed the following HEP with patient with patient able to demonstrate a set of the following with min cuing for correction needed. PT educated patient on parameters of therex (how/when to inc/decrease intensity, frequency, rep/set range, stretch hold time, and purpose of therex) with verbalized understanding.   Access Code: [id number] - Seated Knee Flexion Extension AROM   - 3-5 x daily - 7 x weekly - 12-20 reps - 5sec hold - Supine Knee Extension Stretch on Towel Roll  - 3-5 x daily - 7 x weekly - 1-24mins hold - Supine Quad Set  - 3-5 x daily - 7 x weekly - 12-20 reps - 5sec hold - Standing Hip Abduction with Resistance at Ankles and Counter Support  - 3 x weekly - 3 sets - 8-12 reps - Sit to Stand Without Arm Support  - 3 x weekly - 3 sets - 8-12 reps   PATIENT EDUCATION:  Education details: Patient was educated on diagnosis, anatomy and pathology involved, prognosis, role of PT, and was given an HEP, demonstrating exercise with proper form following verbal and tactile cues, and was given a paper hand out to continue exercise at home. Pt was educated on and agreed to plan of care.  Person educated: Patient Education method: Explanation, Demonstration, Verbal cues, and Handouts Education comprehension: verbalized understanding, returned demonstration, and verbal cues required  HOME EXERCISE PROGRAM: [id number]  ASSESSMENT:  CLINICAL IMPRESSION: Patient is a 72 y.o. female who was seen today for physical therapy evaluation and treatment for s/p L TKA 06/12/22. Impairments in L knee ROM, LLE  strength, static balance, postural abnormalities, gait abnormalities, and pain. Activity limitations in household and community ambulation, stair negotiation, basic transfers, squatting, lifting, outdoor ambulation, biking, bathing, cooking, cleaning; inhibiting hobbies of hiking/biking and household ADLs. Would benefit from skilled PT to address above deficits and promote optimal return to PLOF.   OBJECTIVE IMPAIRMENTS: Abnormal gait, decreased activity tolerance, decreased balance, decreased coordination, decreased endurance, decreased knowledge of use of DME, decreased mobility, difficulty walking, decreased ROM, decreased strength, increased edema, impaired flexibility, improper body mechanics, postural dysfunction, and pain.   ACTIVITY LIMITATIONS: carrying, lifting, bending, sitting, standing, squatting, stairs, transfers, bed mobility, bathing, and toileting  PARTICIPATION LIMITATIONS: meal prep, cleaning, laundry, driving, shopping, community activity, and yard work  PERSONAL FACTORS: Age, Fitness, Past/current experiences, Sex, and 1-2 comorbidities: L knee ACL repair, chronic L knee pain   are also affecting patient's functional outcome.   REHAB POTENTIAL: Good  CLINICAL DECISION MAKING: Evolving/moderate complexity  EVALUATION COMPLEXITY: Moderate   GOALS: Goals reviewed with patient? Yes  SHORT TERM GOALS: Target date: 07/28/22 Pt will be independent with HEP in order to improve strength and balance in order to decrease fall risk and improve function at home and work.  Baseline:HEP given  Goal status: INITIAL   LONG TERM GOALS: Target date: 09/06/22  Patient will increase FOTO score to 69 to demonstrate predicted increase in functional mobility to complete ADLs Baseline: 49 Goal status: INITIAL  2.  Pt will increase 10MWT to at least 1.31m/s in order to demonstrate clinically significant independence in community ambulation and PLOF  Baseline: 5 times sit to stand:  15sec 10 meter walk test: without AD: self selected: 0.90m/s; fastest 0.6m/s  Goal status: INITIAL  3.  Pt will demonstrate 5xSTS time of 12 sec or less in order to demonstrate age matched and clinically significant increase in LE strength Baseline: 15sec Goal status: INITIAL  4.  Pt will demonstrate L knee  ROM of 0-120d in order to be able to complete mountain biking (PLOF)  Baseline: 4-94d Goal status: INITIAL  5.  Pt will be able to balance on LLE for at least sec in order to demonstrate 90% of static balance of unaffected side Baseline: 56sec Goal status: INITIAL   PLAN:  PT FREQUENCY: 1-2x/week  PT DURATION: 8 weeks  PLANNED INTERVENTIONS: Therapeutic exercises, Therapeutic activity, Neuromuscular re-education, Balance training, Gait training, Patient/Family education, Self Care, Joint mobilization, Joint manipulation, Stair training, DME instructions, Dry Needling, Electrical stimulation, Spinal manipulation, Spinal mobilization, Cryotherapy, Moist heat, and Traction  PLAN FOR NEXT SESSION: L knee mobility  Durwin Reges DPT Durwin Reges, PT 06/26/2022, 2:25 PM

## 2022-06-27 ENCOUNTER — Other Ambulatory Visit: Payer: Self-pay | Admitting: Sports Medicine

## 2022-06-27 MED ORDER — GABAPENTIN 100 MG PO CAPS: 200.0000 mg | ORAL_CAPSULE | Freq: Two times a day (BID) | ORAL | 0 refills | Status: DC

## 2022-06-28 ENCOUNTER — Other Ambulatory Visit: Payer: Self-pay | Admitting: Orthopaedic Surgery

## 2022-06-28 ENCOUNTER — Telehealth: Payer: Self-pay | Admitting: *Deleted

## 2022-06-28 ENCOUNTER — Other Ambulatory Visit: Payer: Self-pay | Admitting: Sports Medicine

## 2022-06-28 MED ORDER — OXYCODONE HCL 5 MG PO TABS: 5.0000 mg | ORAL_TABLET | Freq: Four times a day (QID) | ORAL | 0 refills | Status: DC | PRN

## 2022-06-28 NOTE — Telephone Encounter (Signed)
Spoke with patient yesterday after she was seen Monday of this week and doing well functionally. She thought maybe dropping down to something other than the Oxycodone would be a good thing Monday, but has had a really hard time with taking Norco since prescribed that day. She tolerates, but states she can really tell the difference in her pain and is taking 2 at a time really scheduled now. She does have about 6 of the Oxycodone left and I told her do not mix these in the same day. She started OPPT this week as well, which has been harder. Just thought I'd check if there is anything different to do. Thank you.

## 2022-06-29 ENCOUNTER — Encounter: Payer: Self-pay | Admitting: Physical Therapy

## 2022-06-29 ENCOUNTER — Ambulatory Visit: Payer: PPO | Admitting: Physical Therapy

## 2022-06-29 DIAGNOSIS — G8929 Other chronic pain: Secondary | ICD-10-CM

## 2022-06-29 DIAGNOSIS — M25562 Pain in left knee: Secondary | ICD-10-CM

## 2022-06-29 NOTE — Therapy (Signed)
OUTPATIENT PHYSICAL THERAPY LOWER EXTREMITY EVALUATION   Patient Name: Kristen Stephenson MRN: [id number] DOB:1950-09-19, 72 y.o., female Today's Date: 06/29/2022  END OF SESSION:  PT End of Session - 06/29/22 0953     Visit Number 2    Number of Visits 17    Date for PT Re-Evaluation 09/07/22    Authorization - Visit Number 2    Authorization - Number of Visits 10    Progress Note Due on Visit 10    PT Start Time 0915    PT Stop Time 0955    PT Time Calculation (min) 40 min    Equipment Utilized During Treatment Gait belt    Activity Tolerance Patient limited by fatigue;Treatment limited secondary to medical complications (Comment)    Behavior During Therapy WFL for tasks assessed/performed              Past Medical History:  Diagnosis Date   Adjustment disorder with anxiety    Anxiety    Arthritis    knee,left   Barrett's esophagus    [facility] Provider Based Lic Grp-Needs Endoscopy in 2023   Cancer ([place])    cervix   Cataract    beginning   GERD (gastroesophageal reflux disease)    H/O ovarian cancer    Heart murmur    History of malignant neoplasm of appendix    Hyperlipidemia    Hyperlipidemia    Lung nodule    Osteopenia    Osteopenia after menopause 09/01/2019   dexa 08/2019   Ovarian cancer ([place]) 2018   Subclinical hypothyroidism 06/15/2019   Past Surgical History:  Procedure Laterality Date   ABDOMINAL HYSTERECTOMY     ANKLE SURGERY  2003   APPENDECTOMY  2018   Cancer/S/P resection and chemo   ARTHROSCOPIC REPAIR ACL Left    BREAST EXCISIONAL BIOPSY     CYST EXCISION Right 1972   Right breast/benign   FEMUR FRACTURE SURGERY Right    Plate with 5 screws   INGUINAL HERNIA REPAIR  11/2007   TONSILLECTOMY     TOTAL KNEE ARTHROPLASTY Left 06/12/2022   Procedure: LEFT TOTAL KNEE ARTHROPLASTY;  Surgeon: Mcarthur Rossetti, MD;  Location: [place];  Service: Orthopedics;  Laterality: Left;   VULVAR LESION REMOVAL  07/2007   benign   Patient Active  Problem List   Diagnosis Date Noted   Status post total left knee replacement 06/12/2022   Vitamin B12 deficiency 05/14/2022   Long-term current use of proton pump inhibitor therapy 05/03/2022   Lumbar and sacral spondyloarthritis 05/03/2022   Barrett's esophagus without dysplasia 04/19/2021   Postcoital UTI - recurrent 02/03/2020   Pulmonary nodules 09/23/2019   Presbycusis of both ears 09/23/2019   Osteopenia after menopause 09/01/2019   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   Anxiety 09/16/2018   History of ovarian cancer 09/16/2018   Mixed hyperlipidemia 09/16/2018   GERD (gastroesophageal reflux disease) 09/16/2018    PCP: Rosendo Gros MD  REFERRING PROVIDER: Ninfa Linden MD  REFERRING DIAG: L TKA  THERAPY DIAG:  Acute pain of left knee  Chronic left-sided low back pain with left-sided sciatica  Rationale for Evaluation and Treatment: Rehabilitation  ONSET DATE: 06/12/22  SUBJECTIVE:   SUBJECTIVE STATEMENT: Pt with 6/10 pain on arrival reporting she had to switch her pain medication back to oxycodone. Is completing HEP, reporting most difficulty with STS  PERTINENT HISTORY: Pt is a 72 year old female presenting s/p L TKA 06/12/22. Pt reports ACL repair 10years ago, that "failed"  causing her so seek TKA. Prior to TKA was having trouble completing hiking with her husband without falling from "my knee would give out". Pt had HHPT for a 4days post op. Reports completing heel slides, prop stretch, standing hip abd and ext, standing on 1 leg, and attempting LAQ. Pt is taking pain medication as prescribed with tylenol as needed. Current and best pain 1/10; worst 6/10. Pt is completing bathing and dressing modi; has walk in shower with grab bars. Husband is completing cooking and cleaning. Husband drives currently as well. Pt is using a walking stick currently and RW  in community- PLOF ind without AD. Pt is retired, enjoys hiking 3-74miles and road biking 5-23miles 3x/week with  her husband. Pt has a trip to [place] in September planned 34miles in 5days. Pt denies N/V, B&B changes, unexplained weight fluctuation, saddle paresthesia, fever, night sweats, or unrelenting night pain at this time.   PAIN:  Are you having pain? Yes: NPRS scale: 1/10 Pain location: L knee Pain description: dull, achy Aggravating factors: moving the knee at all, standing Relieving factors: ice machine, and pain medication.   PRECAUTIONS: None  WEIGHT BEARING RESTRICTIONS: No  FALLS:  Has patient fallen in last 6 months? Yes. Number of falls 6 with L knee giving out on hiking trail  LIVING ENVIRONMENT: Lives with: lives with their spouse Lives in: House/apartment Stairs: No Has following equipment at home: Single point cane; RW  OCCUPATION: Retired  PLOF: Independent  PATIENT GOALS: Get back to hiking and biking  NEXT MD VISIT: 07/26/22  OBJECTIVE:   DIAGNOSTIC FINDINGS: none  PATIENT SURVEYS:  FOTO 49; goal 4  COGNITION: Overall cognitive status: Within functional limits for tasks assessed     SENSATION: WFL  EDEMA:  Circumferential: mid patella L43cm R36cm   Muscle length WNL bilat  POSTURE: weight shift right R knee hyperext, "hanging on Y ligament: posture  PALPATION: TTP grossly at L knee; minimal patellar motion all directions  LOWER EXTREMITY ROM:  Active /PROMROM Right eval Left eval  Hip flexion All bilat hip mobility WNL  Hip extension   Hip abduction   Hip adduction   Hip internal rotation   Hip external rotation   Knee flexion 150 94/101  Knee extension -5 4/0  Ankle dorsiflexion    Ankle plantarflexion    Ankle inversion    Ankle eversion     (Blank rows = not tested)  LOWER EXTREMITY MMT:  MMT Right eval Left eval  Hip flexion 4+ 4  Hip extension 4+ 4+  Hip abduction 4+ 4-  Hip adduction    Hip internal rotation 5   Hip external rotation 4+   Knee flexion 5   Knee extension 5   Ankle dorsiflexion 5 5  Ankle  plantarflexion    Ankle inversion    Ankle eversion     (Blank rows = not tested)  1RM RLE knee ext 25# knee flex 10# but limited by LLE pain- would be good to retest   FUNCTIONAL TESTS:  5 times sit to stand: 15sec 10 meter walk test: without AD: self selected: 0.32m/s; fastest 0.58m/s  SLS RLE: 62sec LLE: 18sec  GAIT: Distance walked: 54M Assistive device utilized:  walking stick Level of assistance: SBA Comments: L knee hyperext with stance phase, decreased R step length   TODAY'S TREATMENT:  DATE: 06/26/22 - Recumbant bike 12 > 8 AAROM mobility 28min - Heel slide on edge of chair for ext promotion as well x12 each direction  Flex 116 following STS from standard chair 2x 8/10 with mirror feedback for equal WB with good carry over Quad set x12 2sec hold SLR 2x 6 with quad set prior for knee ext- good maintenance throughout Standing hip abd GTB 2x 10 bilat with min cuin for posture and eccentric control with good carry over- reports not completing bilat at home- educated on rationale with understanding     PATIENT EDUCATION:  Education details: Patient was educated on diagnosis, anatomy and pathology involved, prognosis, role of PT, and was given an HEP, demonstrating exercise with proper form following verbal and tactile cues, and was given a paper hand out to continue exercise at home. Pt was educated on and agreed to plan of care.  Person educated: Patient Education method: Explanation, Demonstration, Verbal cues, and Handouts Education comprehension: verbalized understanding, returned demonstration, and verbal cues required  HOME EXERCISE PROGRAM: [id number]  ASSESSMENT:  CLINICAL IMPRESSION: PT initiated therex progression for increased L knee mobility and LLE strength with success. Patient is able to comply with all cuing for proper technique of  therex with excellent motivation throughout session. PT reviewed HEP with minimal modifications needed for success. Pt reports reduced pain (2/10) at end of session. PT will continue progression as able.    OBJECTIVE IMPAIRMENTS: Abnormal gait, decreased activity tolerance, decreased balance, decreased coordination, decreased endurance, decreased knowledge of use of DME, decreased mobility, difficulty walking, decreased ROM, decreased strength, increased edema, impaired flexibility, improper body mechanics, postural dysfunction, and pain.   ACTIVITY LIMITATIONS: carrying, lifting, bending, sitting, standing, squatting, stairs, transfers, bed mobility, bathing, and toileting  PARTICIPATION LIMITATIONS: meal prep, cleaning, laundry, driving, shopping, community activity, and yard work  PERSONAL FACTORS: Age, Fitness, Past/current experiences, Sex, and 1-2 comorbidities: L knee ACL repair, chronic L knee pain   are also affecting patient's functional outcome.   REHAB POTENTIAL: Good  CLINICAL DECISION MAKING: Evolving/moderate complexity  EVALUATION COMPLEXITY: Moderate   GOALS: Goals reviewed with patient? Yes  SHORT TERM GOALS: Target date: 07/28/22 Pt will be independent with HEP in order to improve strength and balance in order to decrease fall risk and improve function at home and work.  Baseline:HEP given  Goal status: INITIAL   LONG TERM GOALS: Target date: 09/06/22  Patient will increase FOTO score to 69 to demonstrate predicted increase in functional mobility to complete ADLs Baseline: 49 Goal status: INITIAL  2.  Pt will increase 10MWT to at least 1.25m/s in order to demonstrate clinically significant independence in community ambulation and PLOF  Baseline: 5 times sit to stand: 15sec 10 meter walk test: without AD: self selected: 0.66m/s; fastest 0.12m/s  Goal status: INITIAL  3.  Pt will demonstrate 5xSTS time of 12 sec or less in order to demonstrate age matched and  clinically significant increase in LE strength Baseline: 15sec Goal status: INITIAL  4.  Pt will demonstrate L knee ROM of 0-120d in order to be able to complete mountain biking (PLOF)  Baseline: 4-94d Goal status: INITIAL  5.  Pt will be able to balance on LLE for at least sec in order to demonstrate 90% of static balance of unaffected side Baseline: 56sec Goal status: INITIAL   PLAN:  PT FREQUENCY: 1-2x/week  PT DURATION: 8 weeks  PLANNED INTERVENTIONS: Therapeutic exercises, Therapeutic activity, Neuromuscular re-education, Balance training, Gait training, Patient/Family education, Self Care,  Joint mobilization, Joint manipulation, Stair training, DME instructions, Dry Needling, Electrical stimulation, Spinal manipulation, Spinal mobilization, Cryotherapy, Moist heat, and Traction  PLAN FOR NEXT SESSION: L knee mobility  Durwin Reges DPT Durwin Reges, PT 06/29/2022, 9:55 AM

## 2022-07-03 ENCOUNTER — Ambulatory Visit: Payer: PPO | Admitting: Physical Therapy

## 2022-07-03 DIAGNOSIS — M25562 Pain in left knee: Secondary | ICD-10-CM | POA: Diagnosis not present

## 2022-07-03 DIAGNOSIS — G8929 Other chronic pain: Secondary | ICD-10-CM

## 2022-07-03 NOTE — Therapy (Signed)
OUTPATIENT PHYSICAL THERAPY LOWER EXTREMITY EVALUATION   Patient Name: Kristen Stephenson MRN: [id number] DOB:31-Mar-1951, 72 y.o., female Today's Date: 07/03/2022  END OF SESSION:  PT End of Session - 07/03/22 1307     Visit Number 3    Number of Visits 17    Date for PT Re-Evaluation 09/07/22    Authorization - Visit Number 3    Authorization - Number of Visits 10    Progress Note Due on Visit 10    PT Start Time [id number]    PT Stop Time 1345    PT Time Calculation (min) 40 min    Equipment Utilized During Treatment Gait belt    Activity Tolerance Patient limited by fatigue;Treatment limited secondary to medical complications (Comment)    Behavior During Therapy WFL for tasks assessed/performed               Past Medical History:  Diagnosis Date   Adjustment disorder with anxiety    Anxiety    Arthritis    knee,left   Barrett's esophagus    [facility] Provider Based Lic Grp-Needs Endoscopy in 2023   Cancer ([place])    cervix   Cataract    beginning   GERD (gastroesophageal reflux disease)    H/O ovarian cancer    Heart murmur    History of malignant neoplasm of appendix    Hyperlipidemia    Hyperlipidemia    Lung nodule    Osteopenia    Osteopenia after menopause 09/01/2019   dexa 08/2019   Ovarian cancer ([place]) 2018   Subclinical hypothyroidism 06/15/2019   Past Surgical History:  Procedure Laterality Date   ABDOMINAL HYSTERECTOMY     ANKLE SURGERY  2003   APPENDECTOMY  2018   Cancer/S/P resection and chemo   ARTHROSCOPIC REPAIR ACL Left    BREAST EXCISIONAL BIOPSY     CYST EXCISION Right 1972   Right breast/benign   FEMUR FRACTURE SURGERY Right    Plate with 5 screws   INGUINAL HERNIA REPAIR  11/2007   TONSILLECTOMY     TOTAL KNEE ARTHROPLASTY Left 06/12/2022   Procedure: LEFT TOTAL KNEE ARTHROPLASTY;  Surgeon: Mcarthur Rossetti, MD;  Location: [place];  Service: Orthopedics;  Laterality: Left;   VULVAR LESION REMOVAL  07/2007   benign   Patient Active  Problem List   Diagnosis Date Noted   Status post total left knee replacement 06/12/2022   Vitamin B12 deficiency 05/14/2022   Long-term current use of proton pump inhibitor therapy 05/03/2022   Lumbar and sacral spondyloarthritis 05/03/2022   Barrett's esophagus without dysplasia 04/19/2021   Postcoital UTI - recurrent 02/03/2020   Pulmonary nodules 09/23/2019   Presbycusis of both ears 09/23/2019   Osteopenia after menopause 09/01/2019   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   Anxiety 09/16/2018   History of ovarian cancer 09/16/2018   Mixed hyperlipidemia 09/16/2018   GERD (gastroesophageal reflux disease) 09/16/2018    PCP: Rosendo Gros MD  REFERRING PROVIDER: Ninfa Linden MD  REFERRING DIAG: L TKA  THERAPY DIAG:  Acute pain of left knee  Chronic left-sided low back pain with left-sided sciatica  Rationale for Evaluation and Treatment: Rehabilitation  ONSET DATE: 06/12/22  SUBJECTIVE:   SUBJECTIVE STATEMENT: Pt reports since yesterday she had some tension in the knee (sat in church 1.5 hours and thinks this is the culprit). Reports compliance with HEP. 4/10 pain on NPRS  PERTINENT HISTORY: Pt is a 72 year old female presenting s/p L TKA 06/12/22. Pt reports  ACL repair 10years ago, that "failed" causing her so seek TKA. Prior to TKA was having trouble completing hiking with her husband without falling from "my knee would give out". Pt had HHPT for a 4days post op. Reports completing heel slides, prop stretch, standing hip abd and ext, standing on 1 leg, and attempting LAQ. Pt is taking pain medication as prescribed with tylenol as needed. Current and best pain 1/10; worst 6/10. Pt is completing bathing and dressing modi; has walk in shower with grab bars. Husband is completing cooking and cleaning. Husband drives currently as well. Pt is using a walking stick currently and RW  in community- PLOF ind without AD. Pt is retired, enjoys hiking 3-67miles and road biking 5-38miles  3x/week with her husband. Pt has a trip to [place] in September planned 46miles in 5days. Pt denies N/V, B&B changes, unexplained weight fluctuation, saddle paresthesia, fever, night sweats, or unrelenting night pain at this time.   PAIN:  Are you having pain? Yes: NPRS scale: 1/10 Pain location: L knee Pain description: dull, achy Aggravating factors: moving the knee at all, standing Relieving factors: ice machine, and pain medication.   PRECAUTIONS: None  WEIGHT BEARING RESTRICTIONS: No  FALLS:  Has patient fallen in last 6 months? Yes. Number of falls 6 with L knee giving out on hiking trail  LIVING ENVIRONMENT: Lives with: lives with their spouse Lives in: House/apartment Stairs: No Has following equipment at home: Single point cane; RW  OCCUPATION: Retired  PLOF: Independent  PATIENT GOALS: Get back to hiking and biking  NEXT MD VISIT: 07/26/22  OBJECTIVE:   DIAGNOSTIC FINDINGS: none  PATIENT SURVEYS:  FOTO 49; goal 41  COGNITION: Overall cognitive status: Within functional limits for tasks assessed     SENSATION: WFL  EDEMA:  Circumferential: mid patella L43cm R36cm   Muscle length WNL bilat  POSTURE: weight shift right R knee hyperext, "hanging on Y ligament: posture  PALPATION: TTP grossly at L knee; minimal patellar motion all directions  LOWER EXTREMITY ROM:  Active /PROMROM Right eval Left eval  Hip flexion All bilat hip mobility WNL  Hip extension   Hip abduction   Hip adduction   Hip internal rotation   Hip external rotation   Knee flexion 150 94/101  Knee extension -5 4/0  Ankle dorsiflexion    Ankle plantarflexion    Ankle inversion    Ankle eversion     (Blank rows = not tested)  LOWER EXTREMITY MMT:  MMT Right eval Left eval  Hip flexion 4+ 4  Hip extension 4+ 4+  Hip abduction 4+ 4-  Hip adduction    Hip internal rotation 5   Hip external rotation 4+   Knee flexion 5   Knee extension 5   Ankle dorsiflexion 5 5   Ankle plantarflexion    Ankle inversion    Ankle eversion     (Blank rows = not tested)  1RM RLE knee ext 25# knee flex 10# but limited by LLE pain- would be good to retest   FUNCTIONAL TESTS:  5 times sit to stand: 15sec 10 meter walk test: without AD: self selected: 0.58m/s; fastest 0.51m/s  SLS RLE: 62sec LLE: 18sec  GAIT: Distance walked: 44M Assistive device utilized:  walking stick Level of assistance: SBA Comments: L knee hyperext with stance phase, decreased R step length   TODAY'S TREATMENT:  DATE: 06/26/22 - Recumbant bike 12 > 8 AAROM mobility 40min - Heel slide on edge of chair for ext promotion as well x12 each direction with 2secH Flex 112 following STS from standard chair with R foot on aerobic step 2x 6/8 with min cuing for eccentric control with good carry over Step up onto 6in step unilateral UE support 2x 10 with cuing for no push off RLE and eccentric lower allowing L knee to flex with decent carry over SLR 2x 10 with quad set prior for knee ext- good maintenance throughout Standing bilat heel raise x12; SL heel raise x10 with min cuing for full raise with good carry over Education on using ankle rocker with toe push off for ambulation with understanding   PATIENT EDUCATION:  Education details: Patient was educated on diagnosis, anatomy and pathology involved, prognosis, role of PT, and was given an HEP, demonstrating exercise with proper form following verbal and tactile cues, and was given a paper hand out to continue exercise at home. Pt was educated on and agreed to plan of care.  Person educated: Patient Education method: Explanation, Demonstration, Verbal cues, and Handouts Education comprehension: verbalized understanding, returned demonstration, and verbal cues required  HOME EXERCISE  PROGRAM: [id number]  ASSESSMENT:  CLINICAL IMPRESSION: PT initiated therex progression for increased L knee mobility and LLE strength with success. Patient is able to comply with all cuing for proper technique of therex with excellent motivation throughout session. PT reviewed HEP with minimal modifications needed for success. Pt reports reduced pain (2/10) at end of session. PT will continue progression as able.    OBJECTIVE IMPAIRMENTS: Abnormal gait, decreased activity tolerance, decreased balance, decreased coordination, decreased endurance, decreased knowledge of use of DME, decreased mobility, difficulty walking, decreased ROM, decreased strength, increased edema, impaired flexibility, improper body mechanics, postural dysfunction, and pain.   ACTIVITY LIMITATIONS: carrying, lifting, bending, sitting, standing, squatting, stairs, transfers, bed mobility, bathing, and toileting  PARTICIPATION LIMITATIONS: meal prep, cleaning, laundry, driving, shopping, community activity, and yard work  PERSONAL FACTORS: Age, Fitness, Past/current experiences, Sex, and 1-2 comorbidities: L knee ACL repair, chronic L knee pain   are also affecting patient's functional outcome.   REHAB POTENTIAL: Good  CLINICAL DECISION MAKING: Evolving/moderate complexity  EVALUATION COMPLEXITY: Moderate   GOALS: Goals reviewed with patient? Yes  SHORT TERM GOALS: Target date: 07/28/22 Pt will be independent with HEP in order to improve strength and balance in order to decrease fall risk and improve function at home and work.  Baseline:HEP given  Goal status: INITIAL   LONG TERM GOALS: Target date: 09/06/22  Patient will increase FOTO score to 69 to demonstrate predicted increase in functional mobility to complete ADLs Baseline: 49 Goal status: INITIAL  2.  Pt will increase 10MWT to at least 1.80m/s in order to demonstrate clinically significant independence in community ambulation and PLOF  Baseline: 5  times sit to stand: 15sec 10 meter walk test: without AD: self selected: 0.77m/s; fastest 0.51m/s  Goal status: INITIAL  3.  Pt will demonstrate 5xSTS time of 12 sec or less in order to demonstrate age matched and clinically significant increase in LE strength Baseline: 15sec Goal status: INITIAL  4.  Pt will demonstrate L knee ROM of 0-120d in order to be able to complete mountain biking (PLOF)  Baseline: 4-94d Goal status: INITIAL  5.  Pt will be able to balance on LLE for at least sec in order to demonstrate 90% of static balance of unaffected side Baseline: 56sec Goal  status: INITIAL   PLAN:  PT FREQUENCY: 1-2x/week  PT DURATION: 8 weeks  PLANNED INTERVENTIONS: Therapeutic exercises, Therapeutic activity, Neuromuscular re-education, Balance training, Gait training, Patient/Family education, Self Care, Joint mobilization, Joint manipulation, Stair training, DME instructions, Dry Needling, Electrical stimulation, Spinal manipulation, Spinal mobilization, Cryotherapy, Moist heat, and Traction  PLAN FOR NEXT SESSION: L knee mobility  Durwin Reges DPT Durwin Reges, PT 07/03/2022, 2:13 PM

## 2022-07-05 ENCOUNTER — Encounter: Payer: Self-pay | Admitting: Orthopaedic Surgery

## 2022-07-05 ENCOUNTER — Ambulatory Visit: Payer: PPO | Admitting: Physical Therapy

## 2022-07-05 ENCOUNTER — Encounter: Payer: Self-pay | Admitting: Physical Therapy

## 2022-07-05 ENCOUNTER — Other Ambulatory Visit: Payer: Self-pay | Admitting: Orthopaedic Surgery

## 2022-07-05 DIAGNOSIS — M25562 Pain in left knee: Secondary | ICD-10-CM | POA: Diagnosis not present

## 2022-07-05 DIAGNOSIS — G8929 Other chronic pain: Secondary | ICD-10-CM

## 2022-07-05 MED ORDER — OXYCODONE HCL 5 MG PO TABS: 5.0000 mg | ORAL_TABLET | Freq: Four times a day (QID) | ORAL | 0 refills | Status: DC | PRN

## 2022-07-05 NOTE — Therapy (Signed)
OUTPATIENT PHYSICAL THERAPY LOWER EXTREMITY EVALUATION   Patient Name: Kristen Stephenson MRN: [id number] DOB:[birth year], 72 y.o., female Today's Date: 07/05/2022  END OF SESSION:  PT End of Session - 07/05/22 1439     Visit Number 4    Number of Visits 17    Date for PT Re-Evaluation 09/07/22    Authorization - Visit Number 4    Authorization - Number of Visits 10    Progress Note Due on Visit 10    PT Start Time 1436    PT Stop Time 1515    PT Time Calculation (min) 39 min    Equipment Utilized During Treatment Gait belt    Activity Tolerance Patient limited by fatigue;Treatment limited secondary to medical complications (Comment)    Behavior During Therapy WFL for tasks assessed/performed                Past Medical History:  Diagnosis Date   Adjustment disorder with anxiety    Anxiety    Arthritis    knee,left   Barrett's esophagus    [facility] Provider [facility] Lic [facility]p-Needs Endoscopy in 2023   Cancer ([place])    cervix   Cataract    beginning   GERD (gastroesophageal reflux disease)    H/O ovarian cancer    Heart murmur    History of malignant neoplasm of appendix    Hyperlipidemia    Hyperlipidemia    Lung nodule    Osteopenia    Osteopenia after menopause 09/01/2019   dexa 08/2019   Ovarian cancer ([place]) 2018   Subclinical hypothyroidism 06/15/2019   Past Surgical History:  Procedure Laterality Date   ABDOMINAL HYSTERECTOMY     ANKLE SURGERY  2003   APPENDECTOMY  2018   Cancer/S/P resection and chemo   ARTHROSCOPIC REPAIR ACL Left    BREAST EXCISIONAL BIOPSY     CYST EXCISION Right 1972   Right breast/benign   FEMUR FRACTURE SURGERY Right    Plate with 5 screws   INGUINAL HERNIA REPAIR  11/2007   TONSILLECTOMY     TOTAL KNEE ARTHROPLASTY Left 06/12/2022   Procedure: LEFT TOTAL KNEE ARTHROPLASTY;  Surgeon: Mcarthur Rossetti, MD;  Location: [place];  Service: Orthopedics;  Laterality: Left;   VULVAR LESION REMOVAL  07/2007   benign   Patient Active  Problem List   Diagnosis Date Noted   Status post total left knee replacement 06/12/2022   Vitamin B12 deficiency 05/14/2022   Long-term current use of proton pump inhibitor therapy 05/03/2022   Lumbar and sacral spondyloarthritis 05/03/2022   Barrett's esophagus without dysplasia 04/19/2021   Postcoital UTI - recurrent 02/03/2020   Pulmonary nodules 09/23/2019   Presbycusis of both ears 09/23/2019   Osteopenia after menopause 09/01/2019   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   Anxiety 09/16/2018   History of ovarian cancer 09/16/2018   Mixed hyperlipidemia 09/16/2018   GERD (gastroesophageal reflux disease) 09/16/2018    PCP: Rosendo Gros MD  REFERRING PROVIDER: Ninfa Linden MD  REFERRING DIAG: L TKA  THERAPY DIAG:  Acute pain of left knee  Chronic left-sided low back pain with left-sided sciatica  Rationale for Evaluation and Treatment: Rehabilitation  ONSET DATE: 06/12/22  SUBJECTIVE:   SUBJECTIVE STATEMENT: Pt reports feeling pretty good. Reports 3/10 pain today.   PERTINENT HISTORY: Pt is a 72 year old female presenting s/p L TKE 06/12/22. Pt reports ACL repair 10years ago, that "failed" causing her so seek TKA. Prior to TKA was having trouble completing hiking  with her husband without falling from "my knee would give out". Pt had HHPT for a 4days post op. Reports completing heel slides, prop stretch, standing hip abd and ext, standing on 1 leg, and attempting LAQ. Pt is taking pain medication as prescribed with tylenol as needed. Current and best pain 1/10; worst 6/10. Pt is completing bathing and dressing modi; has walk in shower with grab bars. Husband is completing cooking and cleaning. Husband drives currently as well. Pt is using a walking stick currently and RW  in community- PLOF ind without AD. Pt is retired, enjoys hiking 3-40miles and road biking 5-60miles 3x/week with her husband. Pt has a trip to [place] in September planned 11miles in 5days. Pt denies N/V,  B&B changes, unexplained weight fluctuation, saddle paresthesia, fever, night sweats, or unrelenting night pain at this time.   PAIN:  Are you having pain? Yes: NPRS scale: 1/10 Pain location: L knee Pain description: dull, achy Aggravating factors: moving the knee at all, standing Relieving factors: ice machine, and pain medication.   PRECAUTIONS: None  WEIGHT BEARING RESTRICTIONS: No  FALLS:  Has patient fallen in last 6 months? Yes. Number of falls 6 with L knee giving out on hiking trail  LIVING ENVIRONMENT: Lives with: lives with their spouse Lives in: House/apartment Stairs: No Has following equipment at home: Single point cane; RW  OCCUPATION: Retired  PLOF: Independent  PATIENT GOALS: Get back to hiking and biking  NEXT MD VISIT: 07/26/22  OBJECTIVE:   DIAGNOSTIC FINDINGS: none  PATIENT SURVEYS:  FOTO 49; goal 42  COGNITION: Overall cognitive status: Within functional limits for tasks assessed     SENSATION: WFL  EDEMA:  Circumferential: mid patella L43cm R36cm   Muscle length WNL bilat  POSTURE: weight shift right R knee hyperext, "hanging on Y ligament: posture  PALPATION: TTP grossly at L knee; minimal patellar motion all directions  LOWER EXTREMITY ROM:  Active /PROMROM Right eval Left eval  Hip flexion All bilat hip mobility WNL  Hip extension   Hip abduction   Hip adduction   Hip internal rotation   Hip external rotation   Knee flexion 150 94/101  Knee extension -5 4/0  Ankle dorsiflexion    Ankle plantarflexion    Ankle inversion    Ankle eversion     (Blank rows = not tested)  LOWER EXTREMITY MMT:  MMT Right eval Left eval  Hip flexion 4+ 4  Hip extension 4+ 4+  Hip abduction 4+ 4-  Hip adduction    Hip internal rotation 5   Hip external rotation 4+   Knee flexion 5   Knee extension 5   Ankle dorsiflexion 5 5  Ankle plantarflexion    Ankle inversion    Ankle eversion     (Blank rows = not tested)  1RM RLE knee  ext 25# knee flex 10# but limited by LLE pain- would be good to retest   FUNCTIONAL TESTS:  5 times sit to stand: 15sec 10 meter walk test: without AD: self selected: 0.10m/s; fastest 0.47m/s  SLS RLE: 62sec LLE: 18sec  GAIT: Distance walked: 39M Assistive device utilized:  walking stick Level of assistance: SBA Comments: L knee hyperext with stance phase, decreased R step length   TODAY'S TREATMENT:  DATE: 06/26/22 - Recumbant bike 10 > 4 AAROM mobility 72min Heel prop stretch  SLR x12; with 3# AW x8 with quad set prior for knee ext- good maintenance throughout STS from standard chair with R foot on aerobic step 2x 8 with min cuing for L wt shift LLE lunge with R foot elevated on aerobic step 2x 10 with max cuing initially for technique with good carry over Standing bilat heel raise x12; SL heel raise x10 with min cuing for full raise with good carry over Education on using ankle rocker with toe push off for ambulation with understanding   PATIENT EDUCATION:  Education details: Patient was educated on diagnosis, anatomy and pathology involved, prognosis, role of PT, and was given an HEP, demonstrating exercise with proper form following verbal and tactile cues, and was given a paper hand out to continue exercise at home. Pt was educated on and agreed to plan of care.  Person educated: Patient Education method: Explanation, Demonstration, Verbal cues, and Handouts Education comprehension: verbalized understanding, returned demonstration, and verbal cues required  HOME EXERCISE PROGRAM: [id number]  ASSESSMENT:  CLINICAL IMPRESSION: PT initiated therex progression for increased L knee mobility and LLE strength with success. Patient is able to comply with all cuing for proper technique of therex with excellent motivation throughout session. PT reviewed HEP with  minimal modifications needed for success. Pt reports reduced pain (2/10) at end of session. PT will continue progression as able.    OBJECTIVE IMPAIRMENTS: Abnormal gait, decreased activity tolerance, decreased balance, decreased coordination, decreased endurance, decreased knowledge of use of DME, decreased mobility, difficulty walking, decreased ROM, decreased strength, increased edema, impaired flexibility, improper body mechanics, postural dysfunction, and pain.   ACTIVITY LIMITATIONS: carrying, lifting, bending, sitting, standing, squatting, stairs, transfers, bed mobility, bathing, and toileting  PARTICIPATION LIMITATIONS: meal prep, cleaning, laundry, driving, shopping, community activity, and yard work  PERSONAL FACTORS: Age, Fitness, Past/current experiences, Sex, and 1-2 comorbidities: L knee ACL repair, chronic L knee pain   are also affecting patient's functional outcome.   REHAB POTENTIAL: Good  CLINICAL DECISION MAKING: Evolving/moderate complexity  EVALUATION COMPLEXITY: Moderate   GOALS: Goals reviewed with patient? Yes  SHORT TERM GOALS: Target date: 07/28/22 Pt will be independent with HEP in order to improve strength and balance in order to decrease fall risk and improve function at home and work.  Baseline:HEP given  Goal status: INITIAL   LONG TERM GOALS: Target date: 09/06/22  Patient will increase FOTO score to 69 to demonstrate predicted increase in functional mobility to complete ADLs Baseline: 49 Goal status: INITIAL  2.  Pt will increase 10MWT to at least 1.25m/s in order to demonstrate clinically significant independence in community ambulation and PLOF  Baseline: 5 times sit to stand: 15sec 10 meter walk test: without AD: self selected: 0.102m/s; fastest 0.67m/s  Goal status: INITIAL  3.  Pt will demonstrate 5xSTS time of 12 sec or less in order to demonstrate age matched and clinically significant increase in LE strength Baseline: 15sec Goal status:  INITIAL  4.  Pt will demonstrate L knee ROM of 0-120d in order to be able to complete mountain biking (PLOF)  Baseline: 4-94d Goal status: INITIAL  5.  Pt will be able to balance on LLE for at least sec in order to demonstrate 90% of static balance of unaffected side Baseline: 56sec Goal status: INITIAL   PLAN:  PT FREQUENCY: 1-2x/week  PT DURATION: 8 weeks  PLANNED INTERVENTIONS: Therapeutic exercises, Therapeutic activity, Neuromuscular re-education, Balance  training, Gait training, Patient/Family education, Self Care, Joint mobilization, Joint manipulation, Stair training, DME instructions, Dry Needling, Electrical stimulation, Spinal manipulation, Spinal mobilization, Cryotherapy, Moist heat, and Traction  PLAN FOR NEXT SESSION: L knee mobility  Durwin Reges DPT Durwin Reges, PT 07/05/2022, 3:16 PM

## 2022-07-09 ENCOUNTER — Ambulatory Visit: Payer: PPO | Attending: Orthopaedic Surgery | Admitting: Physical Therapy

## 2022-07-09 ENCOUNTER — Encounter: Payer: Self-pay | Admitting: Physical Therapy

## 2022-07-09 DIAGNOSIS — M25562 Pain in left knee: Secondary | ICD-10-CM | POA: Diagnosis not present

## 2022-07-09 DIAGNOSIS — G8929 Other chronic pain: Secondary | ICD-10-CM | POA: Diagnosis not present

## 2022-07-09 DIAGNOSIS — M5442 Lumbago with sciatica, left side: Secondary | ICD-10-CM | POA: Insufficient documentation

## 2022-07-09 NOTE — Therapy (Signed)
OUTPATIENT PHYSICAL THERAPY LOWER EXTREMITY EVALUATION   Patient Name: Kristen Stephenson MRN: [id number] DOB:[birth year], 72 y.o., female Today's Date: 07/09/2022  END OF SESSION:  PT End of Session - 07/09/22 1514     Visit Number 5    Number of Visits 17    Date for PT Re-Evaluation 09/07/22    Authorization - Visit Number 5    Authorization - Number of Visits 10    Progress Note Due on Visit 10    PT Start Time 1510    PT Stop Time [id number]    PT Time Calculation (min) 38 min    Activity Tolerance Patient limited by fatigue;Treatment limited secondary to medical complications (Comment)    Behavior During Therapy [facility] for tasks assessed/performed                 Past Medical History:  Diagnosis Date   Adjustment disorder with anxiety    Anxiety    Arthritis    knee,left   Barrett's esophagus    [facility] Provider Based Lic Grp-Needs Endoscopy in 2023   Cancer    cervix   Cataract    beginning   GERD (gastroesophageal reflux disease)    H/O ovarian cancer    Heart murmur    History of malignant neoplasm of appendix    Hyperlipidemia    Hyperlipidemia    Lung nodule    Osteopenia    Osteopenia after menopause 09/01/2019   dexa 08/2019   Ovarian cancer 2018   Subclinical hypothyroidism 06/15/2019   Past Surgical History:  Procedure Laterality Date   ABDOMINAL HYSTERECTOMY     ANKLE SURGERY  2003   APPENDECTOMY  2018   Cancer/S/P resection and chemo   ARTHROSCOPIC REPAIR ACL Left    BREAST EXCISIONAL BIOPSY     CYST EXCISION Right 1972   Right breast/benign   FEMUR FRACTURE SURGERY Right    Plate with 5 screws   INGUINAL HERNIA REPAIR  11/2007   TONSILLECTOMY     TOTAL KNEE ARTHROPLASTY Left 06/12/2022   Procedure: LEFT TOTAL KNEE ARTHROPLASTY;  Surgeon: Mcarthur Rossetti, MD;  Location: [place];  Service: Orthopedics;  Laterality: Left;   VULVAR LESION REMOVAL  07/2007   benign   Patient Active Problem List   Diagnosis Date Noted   Status post total  left knee replacement 06/12/2022   Vitamin B12 deficiency 05/14/2022   Long-term current use of proton pump inhibitor therapy 05/03/2022   Lumbar and sacral spondyloarthritis 05/03/2022   Barrett's esophagus without dysplasia 04/19/2021   Postcoital UTI - recurrent 02/03/2020   Pulmonary nodules 09/23/2019   Presbycusis of both ears 09/23/2019   Osteopenia after menopause 09/01/2019   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   Anxiety 09/16/2018   History of ovarian cancer 09/16/2018   Mixed hyperlipidemia 09/16/2018   GERD (gastroesophageal reflux disease) 09/16/2018    PCP: Rosendo Gros MD  REFERRING PROVIDER: Ninfa Linden MD  REFERRING DIAG: L TKA  THERAPY DIAG:  Acute pain of left knee  Chronic left-sided low back pain with left-sided sciatica  Rationale for Evaluation and Treatment: Rehabilitation  ONSET DATE: 06/12/22  SUBJECTIVE:   SUBJECTIVE STATEMENT: Pt reports feeling pretty good. Reports she has been driving and that is going well. Reports 3/10 pain today.   PERTINENT HISTORY: Pt is a 72 year old female presenting s/p L TKE 06/12/22. Pt reports ACL repair 10years ago, that "failed" causing her so seek TKA. Prior to TKA was having trouble completing hiking  with her husband without falling from "my knee would give out". Pt had HHPT for a 4days post op. Reports completing heel slides, prop stretch, standing hip abd and ext, standing on 1 leg, and attempting LAQ. Pt is taking pain medication as prescribed with tylenol as needed. Current and best pain 1/10; worst 6/10. Pt is completing bathing and dressing modi; has walk in shower with grab bars. Husband is completing cooking and cleaning. Husband drives currently as well. Pt is using a walking stick currently and RW  in community- PLOF ind without AD. Pt is retired, enjoys hiking 3-5miles and road biking 5-16miles 3x/week with her husband. Pt has a trip to [place] in September planned 51miles in 5days. Pt denies N/V, B&B  changes, unexplained weight fluctuation, saddle paresthesia, fever, night sweats, or unrelenting night pain at this time.   PAIN:  Are you having pain? Yes: NPRS scale: 1/10 Pain location: L knee Pain description: dull, achy Aggravating factors: moving the knee at all, standing Relieving factors: ice machine, and pain medication.   PRECAUTIONS: None  WEIGHT BEARING RESTRICTIONS: No  FALLS:  Has patient fallen in last 6 months? Yes. Number of falls 6 with L knee giving out on hiking trail  LIVING ENVIRONMENT: Lives with: lives with their spouse Lives in: House/apartment Stairs: No Has following equipment at home: Single point cane; RW  OCCUPATION: Retired  PLOF: Independent  PATIENT GOALS: Get back to hiking and biking  NEXT MD VISIT: 07/26/22  OBJECTIVE:   DIAGNOSTIC FINDINGS: none  PATIENT SURVEYS:  FOTO 49; goal 16  COGNITION: Overall cognitive status: Within functional limits for tasks assessed     SENSATION: WFL  EDEMA:  Circumferential: mid patella L43cm R36cm   Muscle length WNL bilat  POSTURE: weight shift right R knee hyperext, "hanging on Y ligament: posture  PALPATION: TTP grossly at L knee; minimal patellar motion all directions  LOWER EXTREMITY ROM:  Active /PROMROM Right eval Left eval  Hip flexion All bilat hip mobility WNL  Hip extension   Hip abduction   Hip adduction   Hip internal rotation   Hip external rotation   Knee flexion 150 94/101  Knee extension -5 4/0  Ankle dorsiflexion    Ankle plantarflexion    Ankle inversion    Ankle eversion     (Blank rows = not tested)  LOWER EXTREMITY MMT:  MMT Right eval Left eval  Hip flexion 4+ 4  Hip extension 4+ 4+  Hip abduction 4+ 4-  Hip adduction    Hip internal rotation 5   Hip external rotation 4+   Knee flexion 5   Knee extension 5   Ankle dorsiflexion 5 5  Ankle plantarflexion    Ankle inversion    Ankle eversion     (Blank rows = not tested)  1RM RLE knee ext  25# knee flex 10# but limited by LLE pain- would be good to retest   FUNCTIONAL TESTS:  5 times sit to stand: 15sec 10 meter walk test: without AD: self selected: 0.48m/s; fastest 0.33m/s  SLS RLE: 62sec LLE: 18sec  GAIT: Distance walked: 79M Assistive device utilized:  walking stick Level of assistance: SBA Comments: L knee hyperext with stance phase, decreased R step length   TODAY'S TREATMENT:  DATE: 06/26/22 - Recumbant bike 8>3 AAROM mobility 63min Heel prop stretch  SLR with 3# AW 2x10 with quad set prior for knee ext- good maintenance throughout Standing hamstring curl 3# AW 2x 10 with max cuing initially for set up technique STS from standard chair with R heel touch only 2x 10 with good carry over of cuing from proper technique TRX squat x12; with narrow BOS for increased knee flexion 288ft without AD 3# AW with cuing for normalized gait  Gastroc stretch on step x30secH   PATIENT EDUCATION:  Education details: Patient was educated on diagnosis, anatomy and pathology involved, prognosis, role of PT, and was given an HEP, demonstrating exercise with proper form following verbal and tactile cues, and was given a paper hand out to continue exercise at home. Pt was educated on and agreed to plan of care.  Person educated: Patient Education method: Explanation, Demonstration, Verbal cues, and Handouts Education comprehension: verbalized understanding, returned demonstration, and verbal cues required  HOME EXERCISE PROGRAM: [id number]  ASSESSMENT:  CLINICAL IMPRESSION: PT initiated therex progression for increased L knee mobility and LLE strength with success. Patient is able to comply with all cuing for proper technique of therex with excellent motivation throughout session. Pt with excellent knee mobility this session. Pt inquiring about riding her road  bike, PT suggests attempting in driveway and reporting back to PT Pt reports reduced pain (2/10) at end of session. PT will continue progression as able.    OBJECTIVE IMPAIRMENTS: Abnormal gait, decreased activity tolerance, decreased balance, decreased coordination, decreased endurance, decreased knowledge of use of DME, decreased mobility, difficulty walking, decreased ROM, decreased strength, increased edema, impaired flexibility, improper body mechanics, postural dysfunction, and pain.   ACTIVITY LIMITATIONS: carrying, lifting, bending, sitting, standing, squatting, stairs, transfers, bed mobility, bathing, and toileting  PARTICIPATION LIMITATIONS: meal prep, cleaning, laundry, driving, shopping, community activity, and yard work  PERSONAL FACTORS: Age, Fitness, Past/current experiences, Sex, and 1-2 comorbidities: L knee ACL repair, chronic L knee pain   are also affecting patient's functional outcome.   REHAB POTENTIAL: Good  CLINICAL DECISION MAKING: Evolving/moderate complexity  EVALUATION COMPLEXITY: Moderate   GOALS: Goals reviewed with patient? Yes  SHORT TERM GOALS: Target date: 07/28/22 Pt will be independent with HEP in order to improve strength and balance in order to decrease fall risk and improve function at home and work.  Baseline:HEP given  Goal status: INITIAL   LONG TERM GOALS: Target date: 09/06/22  Patient will increase FOTO score to 69 to demonstrate predicted increase in functional mobility to complete ADLs Baseline: 49 Goal status: INITIAL  2.  Pt will increase 10MWT to at least 1.30m/s in order to demonstrate clinically significant independence in community ambulation and PLOF  Baseline: 5 times sit to stand: 15sec 10 meter walk test: without AD: self selected: 0.26m/s; fastest 0.12m/s  Goal status: INITIAL  3.  Pt will demonstrate 5xSTS time of 12 sec or less in order to demonstrate age matched and clinically significant increase in LE  strength Baseline: 15sec Goal status: INITIAL  4.  Pt will demonstrate L knee ROM of 0-120d in order to be able to complete mountain biking (PLOF)  Baseline: 4-94d Goal status: INITIAL  5.  Pt will be able to balance on LLE for at least sec in order to demonstrate 90% of static balance of unaffected side Baseline: 56sec Goal status: INITIAL   PLAN:  PT FREQUENCY: 1-2x/week  PT DURATION: 8 weeks  PLANNED INTERVENTIONS: Therapeutic exercises, Therapeutic activity, Neuromuscular re-education,  Balance training, Gait training, Patient/Family education, Self Care, Joint mobilization, Joint manipulation, Stair training, DME instructions, Dry Needling, Electrical stimulation, Spinal manipulation, Spinal mobilization, Cryotherapy, Moist heat, and Traction  PLAN FOR NEXT SESSION: L knee mobility  Durwin Reges DPT Durwin Reges, PT 07/09/2022, 3:50 PM

## 2022-07-10 ENCOUNTER — Encounter: Payer: PPO | Admitting: Physical Therapy

## 2022-07-11 ENCOUNTER — Ambulatory Visit: Payer: PPO | Admitting: Physical Therapy

## 2022-07-11 ENCOUNTER — Encounter: Payer: Self-pay | Admitting: Physical Therapy

## 2022-07-11 ENCOUNTER — Encounter: Payer: Self-pay | Admitting: Orthopaedic Surgery

## 2022-07-11 DIAGNOSIS — M25562 Pain in left knee: Secondary | ICD-10-CM | POA: Diagnosis not present

## 2022-07-11 DIAGNOSIS — G8929 Other chronic pain: Secondary | ICD-10-CM

## 2022-07-11 NOTE — Therapy (Signed)
OUTPATIENT PHYSICAL THERAPY LOWER EXTREMITY EVALUATION   Patient Name: Kristen Stephenson MRN: [id number] DOB:[birth year], 72 y.o., female Today's Date: 07/11/2022  END OF SESSION:  PT End of Session - 07/11/22 1312     Visit Number 6    Number of Visits 17    Date for PT Re-Evaluation 09/07/22    Authorization - Visit Number 6    Authorization - Number of Visits 10    Progress Note Due on Visit 10    PT Start Time 1309    PT Stop Time [id number]    PT Time Calculation (min) 38 min    Equipment Utilized During Treatment Gait belt    Activity Tolerance Patient limited by fatigue;Treatment limited secondary to medical complications (Comment)    Behavior During Therapy WFL for tasks assessed/performed                  Past Medical History:  Diagnosis Date   Adjustment disorder with anxiety    Anxiety    Arthritis    knee,left   Barrett's esophagus    [facility] Provider Based Lic Grp-Needs Endoscopy in 2023   Cancer    cervix   Cataract    beginning   GERD (gastroesophageal reflux disease)    H/O ovarian cancer    Heart murmur    History of malignant neoplasm of appendix    Hyperlipidemia    Hyperlipidemia    Lung nodule    Osteopenia    Osteopenia after menopause 09/01/2019   dexa 08/2019   Ovarian cancer 2018   Subclinical hypothyroidism 06/15/2019   Past Surgical History:  Procedure Laterality Date   ABDOMINAL HYSTERECTOMY     ANKLE SURGERY  2003   APPENDECTOMY  2018   Cancer/S/P resection and chemo   ARTHROSCOPIC REPAIR ACL Left    BREAST EXCISIONAL BIOPSY     CYST EXCISION Right 1972   Right breast/benign   FEMUR FRACTURE SURGERY Right    Plate with 5 screws   INGUINAL HERNIA REPAIR  11/2007   TONSILLECTOMY     TOTAL KNEE ARTHROPLASTY Left 06/12/2022   Procedure: LEFT TOTAL KNEE ARTHROPLASTY;  Surgeon: Mcarthur Rossetti, MD;  Location: [facility];  Service: Orthopedics;  Laterality: Left;   VULVAR LESION REMOVAL  07/2007   benign   Patient Active Problem  List   Diagnosis Date Noted   Status post total left knee replacement 06/12/2022   Vitamin B12 deficiency 05/14/2022   Long-term current use of proton pump inhibitor therapy 05/03/2022   Lumbar and sacral spondyloarthritis 05/03/2022   Barrett's esophagus without dysplasia 04/19/2021   Postcoital UTI - recurrent 02/03/2020   Pulmonary nodules 09/23/2019   Presbycusis of both ears 09/23/2019   Osteopenia after menopause 09/01/2019   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   Anxiety 09/16/2018   History of ovarian cancer 09/16/2018   Mixed hyperlipidemia 09/16/2018   GERD (gastroesophageal reflux disease) 09/16/2018    PCP: Rosendo Gros MD  REFERRING PROVIDER: Ninfa Linden MD  REFERRING DIAG: L TKA  THERAPY DIAG:  Acute pain of left knee  Chronic left-sided low back pain with left-sided sciatica  Rationale for Evaluation and Treatment: Rehabilitation  ONSET DATE: 06/12/22  SUBJECTIVE:   SUBJECTIVE STATEMENT: Pt reports feeling pretty good. Reports she has been driving and that is going well. Reports 3/10 pain today.   PERTINENT HISTORY: Pt is a 72 year old female presenting s/p L TKE 06/12/22. Pt reports ACL repair 10years ago, that "failed" causing her so  seek TKA. Prior to TKA was having trouble completing hiking with her husband without falling from "my knee would give out". Pt had HHPT for a 4days post op. Reports completing heel slides, prop stretch, standing hip abd and ext, standing on 1 leg, and attempting LAQ. Pt is taking pain medication as prescribed with tylenol as needed. Current and best pain 1/10; worst 6/10. Pt is completing bathing and dressing modi; has walk in shower with grab bars. Husband is completing cooking and cleaning. Husband drives currently as well. Pt is using a walking stick currently and RW  in community- PLOF ind without AD. Pt is retired, enjoys hiking 3-40miles and road biking 5-53miles 3x/week with her husband. Pt has a trip to [place] in  September planned 43miles in 5days. Pt denies N/V, B&B changes, unexplained weight fluctuation, saddle paresthesia, fever, night sweats, or unrelenting night pain at this time.   PAIN:  Are you having pain? Yes: NPRS scale: 1/10 Pain location: L knee Pain description: dull, achy Aggravating factors: moving the knee at all, standing Relieving factors: ice machine, and pain medication.   PRECAUTIONS: None  WEIGHT BEARING RESTRICTIONS: No  FALLS:  Has patient fallen in last 6 months? Yes. Number of falls 6 with L knee giving out on hiking trail  LIVING ENVIRONMENT: Lives with: lives with their spouse Lives in: House/apartment Stairs: No Has following equipment at home: Single point cane; RW  OCCUPATION: Retired  PLOF: Independent  PATIENT GOALS: Get back to hiking and biking  NEXT MD VISIT: 07/26/22  OBJECTIVE:   DIAGNOSTIC FINDINGS: none  PATIENT SURVEYS:  FOTO 49; goal 2  COGNITION: Overall cognitive status: Within functional limits for tasks assessed     SENSATION: WFL  EDEMA:  Circumferential: mid patella L43cm R36cm   Muscle length WNL bilat  POSTURE: weight shift right R knee hyperext, "hanging on Y ligament: posture  PALPATION: TTP grossly at L knee; minimal patellar motion all directions  LOWER EXTREMITY ROM:  Active /PROMROM Right eval Left eval  Hip flexion All bilat hip mobility WNL  Hip extension   Hip abduction   Hip adduction   Hip internal rotation   Hip external rotation   Knee flexion 150 94/101  Knee extension -5 4/0  Ankle dorsiflexion    Ankle plantarflexion    Ankle inversion    Ankle eversion     (Blank rows = not tested)  LOWER EXTREMITY MMT:  MMT Right eval Left eval  Hip flexion 4+ 4  Hip extension 4+ 4+  Hip abduction 4+ 4-  Hip adduction    Hip internal rotation 5   Hip external rotation 4+   Knee flexion 5   Knee extension 5   Ankle dorsiflexion 5 5  Ankle plantarflexion    Ankle inversion    Ankle  eversion     (Blank rows = not tested)  1RM RLE knee ext 25# knee flex 10# but limited by LLE pain- would be good to retest   FUNCTIONAL TESTS:  5 times sit to stand: 15sec 10 meter walk test: without AD: self selected: 0.62m/s; fastest 0.46m/s  SLS RLE: 62sec LLE: 18sec  GAIT: Distance walked: 42M Assistive device utilized:  walking stick Level of assistance: SBA Comments: L knee hyperext with stance phase, decreased R step length   TODAY'S TREATMENT:  DATE: 06/26/22 - Recumbant bike 5>2 AAROM mobility 26min; L5 resist 71min Heel prop stretch 60secH STS from standard chair with R heel touch only 2x 10 with good carry over of cuing from proper technique Straight leg deadlift with LLE on 3in step 20# KB 2x 10 with cuing and demo with mirroring initially for technique with good carry over Drop squat x6 with max cuing for LLE WB with decent carry over Drop squat from 6in step x6 TRX squat x12; with narrow BOS for increased knee flexion x12 Gastroc stretch on step x30secH   PATIENT EDUCATION:  Education details: Patient was educated on diagnosis, anatomy and pathology involved, prognosis, role of PT, and was given an HEP, demonstrating exercise with proper form following verbal and tactile cues, and was given a paper hand out to continue exercise at home. Pt was educated on and agreed to plan of care.  Person educated: Patient Education method: Explanation, Demonstration, Verbal cues, and Handouts Education comprehension: verbalized understanding, returned demonstration, and verbal cues required  HOME EXERCISE PROGRAM: [id number]  ASSESSMENT:  CLINICAL IMPRESSION: PT initiated therex progression for increased L knee mobility and LLE strength with success. Patient is able to comply with all cuing for proper technique of therex with excellent motivation  throughout session. Pt with excellent knee mobility this session. Pt with evident increased swelling in LLE knee > ankle. Pt reports she has been moving more, has not been wearing compression socks. Negative homans, no TTP at gastroc, or warmth. PT advised patient to wear compression stocking, ice, and contact MD should any of above symptoms occur- likely due to increased activity more than anything. Pt reports reduced pain (0/10) at end of session. PT will continue progression as able.    OBJECTIVE IMPAIRMENTS: Abnormal gait, decreased activity tolerance, decreased balance, decreased coordination, decreased endurance, decreased knowledge of use of DME, decreased mobility, difficulty walking, decreased ROM, decreased strength, increased edema, impaired flexibility, improper body mechanics, postural dysfunction, and pain.   ACTIVITY LIMITATIONS: carrying, lifting, bending, sitting, standing, squatting, stairs, transfers, bed mobility, bathing, and toileting  PARTICIPATION LIMITATIONS: meal prep, cleaning, laundry, driving, shopping, community activity, and yard work  PERSONAL FACTORS: Age, Fitness, Past/current experiences, Sex, and 1-2 comorbidities: L knee ACL repair, chronic L knee pain   are also affecting patient's functional outcome.   REHAB POTENTIAL: Good  CLINICAL DECISION MAKING: Evolving/moderate complexity  EVALUATION COMPLEXITY: Moderate   GOALS: Goals reviewed with patient? Yes  SHORT TERM GOALS: Target date: 07/28/22 Pt will be independent with HEP in order to improve strength and balance in order to decrease fall risk and improve function at home and work.  Baseline:HEP given  Goal status: INITIAL   LONG TERM GOALS: Target date: 09/06/22  Patient will increase FOTO score to 69 to demonstrate predicted increase in functional mobility to complete ADLs Baseline: 49 Goal status: INITIAL  2.  Pt will increase 10MWT to at least 1.43m/s in order to demonstrate clinically  significant independence in community ambulation and PLOF  Baseline: 5 times sit to stand: 15sec 10 meter walk test: without AD: self selected: 0.46m/s; fastest 0.64m/s  Goal status: INITIAL  3.  Pt will demonstrate 5xSTS time of 12 sec or less in order to demonstrate age matched and clinically significant increase in LE strength Baseline: 15sec Goal status: INITIAL  4.  Pt will demonstrate L knee ROM of 0-120d in order to be able to complete mountain biking (PLOF)  Baseline: 4-94d Goal status: INITIAL  5.  Pt will be  able to balance on LLE for at least sec in order to demonstrate 90% of static balance of unaffected side Baseline: 56sec Goal status: INITIAL   PLAN:  PT FREQUENCY: 1-2x/week  PT DURATION: 8 weeks  PLANNED INTERVENTIONS: Therapeutic exercises, Therapeutic activity, Neuromuscular re-education, Balance training, Gait training, Patient/Family education, Self Care, Joint mobilization, Joint manipulation, Stair training, DME instructions, Dry Needling, Electrical stimulation, Spinal manipulation, Spinal mobilization, Cryotherapy, Moist heat, and Traction  PLAN FOR NEXT SESSION: L knee mobility  Durwin Reges DPT Durwin Reges, PT 07/11/2022, 1:47 PM

## 2022-07-12 ENCOUNTER — Encounter: Payer: PPO | Admitting: Physical Therapy

## 2022-07-16 ENCOUNTER — Ambulatory Visit: Payer: PPO | Admitting: Physical Therapy

## 2022-07-16 DIAGNOSIS — M25562 Pain in left knee: Secondary | ICD-10-CM | POA: Diagnosis not present

## 2022-07-16 NOTE — Therapy (Signed)
OUTPATIENT PHYSICAL THERAPY LOWER EXTREMITY EVALUATION   Patient Name: Kristen Stephenson MRN: [id number] DOB:1950-09-24, 72 y.o., female Today's Date: 07/16/2022  END OF SESSION:  PT End of Session - 07/16/22 1049     Visit Number 7    Number of Visits 17    Date for PT Re-Evaluation 09/07/22    Authorization - Visit Number 7    Authorization - Number of Visits 10    Progress Note Due on Visit 10    PT Start Time 1045    PT Stop Time 1125    PT Time Calculation (min) 40 min    Equipment Utilized During Treatment Gait belt    Activity Tolerance Patient limited by fatigue;Treatment limited secondary to medical complications (Comment)    Behavior During Therapy WFL for tasks assessed/performed                   Past Medical History:  Diagnosis Date   Adjustment disorder with anxiety    Anxiety    Arthritis    knee,left   Barrett's esophagus    [facility] Provider Based Lic Grp-Needs Endoscopy in 2023   Cancer    cervix   Cataract    beginning   GERD (gastroesophageal reflux disease)    H/O ovarian cancer    Heart murmur    History of malignant neoplasm of appendix    Hyperlipidemia    Hyperlipidemia    Lung nodule    Osteopenia    Osteopenia after menopause 09/01/2019   dexa 08/2019   Ovarian cancer 2018   Subclinical hypothyroidism 06/15/2019   Past Surgical History:  Procedure Laterality Date   ABDOMINAL HYSTERECTOMY     ANKLE SURGERY  2003   APPENDECTOMY  2018   Cancer/S/P resection and chemo   ARTHROSCOPIC REPAIR ACL Left    BREAST EXCISIONAL BIOPSY     CYST EXCISION Right 1972   Right breast/benign   FEMUR FRACTURE SURGERY Right    Plate with 5 screws   INGUINAL HERNIA REPAIR  11/2007   TONSILLECTOMY     TOTAL KNEE ARTHROPLASTY Left 06/12/2022   Procedure: LEFT TOTAL KNEE ARTHROPLASTY;  Surgeon: Kathryne Hitch, MD;  Location: [facility];  Service: Orthopedics;  Laterality: Left;   VULVAR LESION REMOVAL  07/2007   benign   Patient Active  Problem List   Diagnosis Date Noted   Status post total left knee replacement 06/12/2022   Vitamin B12 deficiency 05/14/2022   Long-term current use of proton pump inhibitor therapy 05/03/2022   Lumbar and sacral spondyloarthritis 05/03/2022   Barrett's esophagus without dysplasia 04/19/2021   Postcoital UTI - recurrent 02/03/2020   Pulmonary nodules 09/23/2019   Presbycusis of both ears 09/23/2019   Osteopenia after menopause 09/01/2019   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   Anxiety 09/16/2018   History of ovarian cancer 09/16/2018   Mixed hyperlipidemia 09/16/2018   GERD (gastroesophageal reflux disease) 09/16/2018    PCP: Durward Mallard MD  REFERRING PROVIDER: Magnus Ivan MD  REFERRING DIAG: L TKA  THERAPY DIAG:  Acute pain of left knee  Rationale for Evaluation and Treatment: Rehabilitation  ONSET DATE: 06/12/22  SUBJECTIVE:   SUBJECTIVE STATEMENT: Pt reports feeling pretty good. Reports she has been driving and that is going well. Reports 3/10 pain today.   PERTINENT HISTORY: Pt is a 72 year old female presenting s/p L TKE 06/12/22. Pt reports ACL repair 10years ago, that "failed" causing her so seek TKA. Prior to TKA was having trouble  completing hiking with her husband without falling from "my knee would give out". Pt had HHPT for a 4days post op. Reports completing heel slides, prop stretch, standing hip abd and ext, standing on 1 leg, and attempting LAQ. Pt is taking pain medication as prescribed with tylenol as needed. Current and best pain 1/10; worst 6/10. Pt is completing bathing and dressing modi; has walk in shower with grab bars. Husband is completing cooking and cleaning. Husband drives currently as well. Pt is using a walking stick currently and RW  in community- PLOF ind without AD. Pt is retired, enjoys hiking 3-15miles and road biking 5-28miles 3x/week with her husband. Pt has a trip to [place] in September planned in 5days. Pt denies N/V, B&B  changes, unexplained weight fluctuation, saddle paresthesia, fever, night sweats, or unrelenting night pain at this time.   PAIN:  Are you having pain? Yes: NPRS scale: 1/10 Pain location: L knee Pain description: dull, achy Aggravating factors: moving the knee at all, standing Relieving factors: ice machine, and pain medication.   PRECAUTIONS: None  WEIGHT BEARING RESTRICTIONS: No  FALLS:  Has patient fallen in last 6 months? Yes. Number of falls 6 with L knee giving out on hiking trail  LIVING ENVIRONMENT: Lives with: lives with their spouse Lives in: House/apartment Stairs: No Has following equipment at home: Single point cane; RW  OCCUPATION: Retired  PLOF: Independent  PATIENT GOALS: Get back to hiking and biking  NEXT MD VISIT: 07/26/22  OBJECTIVE:   DIAGNOSTIC FINDINGS: none  PATIENT SURVEYS:  FOTO 49; goal 34  COGNITION: Overall cognitive status: Within functional limits for tasks assessed     SENSATION: WFL  EDEMA:  Circumferential: mid patella L43cm R36cm   Muscle length WNL bilat  POSTURE: weight shift right R knee hyperext, "hanging on Y ligament: posture  PALPATION: TTP grossly at L knee; minimal patellar motion all directions  LOWER EXTREMITY ROM:  Active /PROMROM Right eval Left eval  Hip flexion All bilat hip mobility WNL  Hip extension   Hip abduction   Hip adduction   Hip internal rotation   Hip external rotation   Knee flexion 150 94/101  Knee extension -5 4/0  Ankle dorsiflexion    Ankle plantarflexion    Ankle inversion    Ankle eversion     (Blank rows = not tested)  LOWER EXTREMITY MMT:  MMT Right eval Left eval  Hip flexion 4+ 4  Hip extension 4+ 4+  Hip abduction 4+ 4-  Hip adduction    Hip internal rotation 5   Hip external rotation 4+   Knee flexion 5   Knee extension 5   Ankle dorsiflexion 5 5  Ankle plantarflexion    Ankle inversion    Ankle eversion     (Blank rows = not tested)  1RM RLE knee ext  25# knee flex 10# but limited by LLE pain- would be good to retest   FUNCTIONAL TESTS:  5 times sit to stand: 15sec 10 meter walk test: without AD: self selected: 0.60m/s; fastest 0.60m/s  SLS RLE: 62sec LLE: 18sec  GAIT: Distance walked: 35M Assistive device utilized:  walking stick Level of assistance: SBA Comments: L knee hyperext with stance phase, decreased R step length   TODAY'S TREATMENT:  DATE: 06/26/22 - Recumbant bike 4>2 AAROM mobility 14min; L5 resist 3min Heel prop stretch 60secH STS from standard chair with R heel touch only 2x 10 with good carry over of cuing from proper technique L SLS 19sec Single leg hip hinge to cone touch 2x 10 with occassional UE touch to rail for stability  Drop squat from step off 6in step 2x 12  On very last rep patient taking a step back toward mat table following squat and begins tripping and quickly stepping, PT assisted patient into qped, and then sit on floor. Patient able to complete floor to chair transfer following minA. Pt reports minimal pain following controlled lower 12000ft amb with walking stick ; 13100ft without AD OMEGA leg press 5# x10; 10# x10; 15# x6 Gastroc stretch on step x30secH   PATIENT EDUCATION:  Education details: Patient was educated on diagnosis, anatomy and pathology involved, prognosis, role of PT, and was given an HEP, demonstrating exercise with proper form following verbal and tactile cues, and was given a paper hand out to continue exercise at home. Pt was educated on and agreed to plan of care.  Person educated: Patient Education method: Explanation, Demonstration, Verbal cues, and Handouts Education comprehension: verbalized understanding, returned demonstration, and verbal cues required  HOME EXERCISE PROGRAM: [id number]  ASSESSMENT:  CLINICAL IMPRESSION: PT initiated therex  progression for increased L knee mobility and LLE strength with success. Patient is able to comply with all cuing for proper technique of therex with excellent motivation throughout session. Pt with excellent knee mobility this session. Pt with evident decreased swelling in LLE knee > ankle from last visit. Pt reports she has been moving more, has not been wearing compression socks. Pt with incidence of controlled lower to floor with PT assist (see note section), able to complete floor to chair transfer after very well with minA.  PT will continue progression as able.    OBJECTIVE IMPAIRMENTS: Abnormal gait, decreased activity tolerance, decreased balance, decreased coordination, decreased endurance, decreased knowledge of use of DME, decreased mobility, difficulty walking, decreased ROM, decreased strength, increased edema, impaired flexibility, improper body mechanics, postural dysfunction, and pain.   ACTIVITY LIMITATIONS: carrying, lifting, bending, sitting, standing, squatting, stairs, transfers, bed mobility, bathing, and toileting  PARTICIPATION LIMITATIONS: meal prep, cleaning, laundry, driving, shopping, community activity, and yard work  PERSONAL FACTORS: Age, Fitness, Past/current experiences, Sex, and 1-2 comorbidities: L knee ACL repair, chronic L knee pain   are also affecting patient's functional outcome.   REHAB POTENTIAL: Good  CLINICAL DECISION MAKING: Evolving/moderate complexity  EVALUATION COMPLEXITY: Moderate   GOALS: Goals reviewed with patient? Yes  SHORT TERM GOALS: Target date: 07/28/22 Pt will be independent with HEP in order to improve strength and balance in order to decrease fall risk and improve function at home and work.  Baseline:HEP given  Goal status: INITIAL   LONG TERM GOALS: Target date: 09/06/22  Patient will increase FOTO score to 69 to demonstrate predicted increase in functional mobility to complete ADLs Baseline: 49 Goal status: INITIAL  2.   Pt will increase 10MWT to at least 1.3814m/s in order to demonstrate clinically significant independence in community ambulation and PLOF  Baseline: 5 times sit to stand: 15sec 10 meter walk test: without AD: self selected: 0.5522m/s; fastest 0.531m/s  Goal status: INITIAL  3.  Pt will demonstrate 5xSTS time of 12 sec or less in order to demonstrate age matched and clinically significant increase in LE strength Baseline: 15sec Goal status: INITIAL  4.  Pt  will demonstrate L knee ROM of 0-120d in order to be able to complete mountain biking (PLOF)  Baseline: 4-94d Goal status: INITIAL  5.  Pt will be able to balance on LLE for at least sec in order to demonstrate 90% of static balance of unaffected side Baseline: 56sec Goal status: INITIAL   PLAN:  PT FREQUENCY: 1-2x/week  PT DURATION: 8 weeks  PLANNED INTERVENTIONS: Therapeutic exercises, Therapeutic activity, Neuromuscular re-education, Balance training, Gait training, Patient/Family education, Self Care, Joint mobilization, Joint manipulation, Stair training, DME instructions, Dry Needling, Electrical stimulation, Spinal manipulation, Spinal mobilization, Cryotherapy, Moist heat, and Traction  PLAN FOR NEXT SESSION: L knee mobility  Hilda Lias DPT Hilda Lias, PT 07/16/2022, 12:09 PM

## 2022-07-18 NOTE — Therapy (Signed)
OUTPATIENT PHYSICAL THERAPY LOWER EXTREMITY TREATMENT   Patient Name: Kristen Stephenson MRN: [id number] DOB:05/10/1950, 72 y.o., female Today's Date: 07/19/2022  END OF SESSION:  PT End of Session - 07/19/22 1438     Visit Number 8    Number of Visits 17    Date for PT Re-Evaluation 09/07/22    Authorization - Visit Number 8    Authorization - Number of Visits 10    Progress Note Due on Visit 10    PT Start Time 1438    PT Stop Time 1530    PT Time Calculation (min) 52 min    Equipment Utilized During Treatment Gait belt    Activity Tolerance Patient limited by fatigue;Treatment limited secondary to medical complications (Comment)    Behavior During Therapy WFL for tasks assessed/performed              Past Medical History:  Diagnosis Date   Adjustment disorder with anxiety    Anxiety    Arthritis    knee,left   Barrett's esophagus    [facility] Provider Based Lic Grp-Needs Endoscopy in 2023   Cancer    cervix   Cataract    beginning   GERD (gastroesophageal reflux disease)    H/O ovarian cancer    Heart murmur    History of malignant neoplasm of appendix    Hyperlipidemia    Hyperlipidemia    Lung nodule    Osteopenia    Osteopenia after menopause 09/01/2019   dexa 08/2019   Ovarian cancer 2018   Subclinical hypothyroidism 06/15/2019   Past Surgical History:  Procedure Laterality Date   ABDOMINAL HYSTERECTOMY     ANKLE SURGERY  2003   APPENDECTOMY  2018   Cancer/S/P resection and chemo   ARTHROSCOPIC REPAIR ACL Left    BREAST EXCISIONAL BIOPSY     CYST EXCISION Right 1972   Right breast/benign   FEMUR FRACTURE SURGERY Right    Plate with 5 screws   INGUINAL HERNIA REPAIR  11/2007   TONSILLECTOMY     TOTAL KNEE ARTHROPLASTY Left 06/12/2022   Procedure: LEFT TOTAL KNEE ARTHROPLASTY;  Surgeon: Kathryne Hitch, MD;  Location: [facility] OR;  Service: Orthopedics;  Laterality: Left;   VULVAR LESION REMOVAL  07/2007   benign   Patient Active Problem List    Diagnosis Date Noted   Status post total left knee replacement 06/12/2022   Vitamin B12 deficiency 05/14/2022   Long-term current use of proton pump inhibitor therapy 05/03/2022   Lumbar and sacral spondyloarthritis 05/03/2022   Barrett's esophagus without dysplasia 04/19/2021   Postcoital UTI - recurrent 02/03/2020   Pulmonary nodules 09/23/2019   Presbycusis of both ears 09/23/2019   Osteopenia after menopause 09/01/2019   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   Anxiety 09/16/2018   History of ovarian cancer 09/16/2018   Mixed hyperlipidemia 09/16/2018   GERD (gastroesophageal reflux disease) 09/16/2018    PCP: Durward Mallard MD  REFERRING PROVIDER: Magnus Ivan MD  REFERRING DIAG: L TKA  THERAPY DIAG:  Acute pain of left knee  Chronic left-sided low back pain with left-sided sciatica  Rationale for Evaluation and Treatment: Rehabilitation  ONSET DATE: 06/12/22  SUBJECTIVE:   SUBJECTIVE STATEMENT:  Pt reports she feels the weather has been playing with the knee.  Pt note 3/10 pain upon arrival in the L knee today.    PERTINENT HISTORY:  Pt is a 72 year old female presenting s/p L TKE 06/12/22. Pt reports ACL repair 10years ago, that "  failed" causing her so seek TKA. Prior to TKA was having trouble completing hiking with her husband without falling from "my knee would give out". Pt had HHPT for a 4days post op. Reports completing heel slides, prop stretch, standing hip abd and ext, standing on 1 leg, and attempting LAQ. Pt is taking pain medication as prescribed with tylenol as needed. Current and best pain 1/10; worst 6/10. Pt is completing bathing and dressing modi; has walk in shower with grab bars. Husband is completing cooking and cleaning. Husband drives currently as well. Pt is using a walking stick currently and RW  in community- PLOF ind without AD. Pt is retired, enjoys hiking 3-2miles and road biking 5-4miles 3x/week with her husband. Pt has a trip to [place] in  September planned in 5days. Pt denies N/V, B&B changes, unexplained weight fluctuation, saddle paresthesia, fever, night sweats, or unrelenting night pain at this time.   PAIN:  Are you having pain? Yes: NPRS scale: 1/10 Pain location: L knee Pain description: dull, achy Aggravating factors: moving the knee at all, standing Relieving factors: ice machine, and pain medication.   PRECAUTIONS: None  WEIGHT BEARING RESTRICTIONS: No  FALLS:  Has patient fallen in last 6 months? Yes. Number of falls 6 with L knee giving out on hiking trail  LIVING ENVIRONMENT: Lives with: lives with their spouse Lives in: House/apartment Stairs: No Has following equipment at home: Single point cane; RW  OCCUPATION: Retired  PLOF: Independent  PATIENT GOALS: Get back to hiking and biking  NEXT MD VISIT: 07/26/22  OBJECTIVE:   DIAGNOSTIC FINDINGS: none  PATIENT SURVEYS:  FOTO 49; goal 46  COGNITION: Overall cognitive status: Within functional limits for tasks assessed     SENSATION: WFL  EDEMA:  Circumferential: mid patella L43cm R36cm   Muscle length WNL bilat  POSTURE: weight shift right R knee hyperext, "hanging on Y ligament: posture  PALPATION: TTP grossly at L knee; minimal patellar motion all directions  LOWER EXTREMITY ROM:  Active /PROMROM Right eval Left eval  Hip flexion All bilat hip mobility WNL  Hip extension   Hip abduction   Hip adduction   Hip internal rotation   Hip external rotation   Knee flexion 150 94/101  Knee extension -5 4/0  Ankle dorsiflexion    Ankle plantarflexion    Ankle inversion    Ankle eversion     (Blank rows = not tested)  LOWER EXTREMITY MMT:  MMT Right eval Left eval  Hip flexion 4+ 4  Hip extension 4+ 4+  Hip abduction 4+ 4-  Hip adduction    Hip internal rotation 5   Hip external rotation 4+   Knee flexion 5   Knee extension 5   Ankle dorsiflexion 5 5  Ankle plantarflexion    Ankle inversion    Ankle  eversion     (Blank rows = not tested)  1RM RLE knee ext 25# knee flex 10# but limited by LLE pain- would be good to retest   FUNCTIONAL TESTS:  5 times sit to stand: 15sec 10 meter walk test: without AD: self selected: 0.52m/s; fastest 0.52m/s  SLS RLE: 62sec LLE: 18sec  GAIT: Distance walked: 59M Assistive device utilized:  walking stick Level of assistance: SBA Comments: L knee hyperext with stance phase, decreased R step length   TODAY'S TREATMENT: DATE: 07/19/22  TherEx:  Recumbent bike seat 4,  L5 resist Heel prop stretch 60secH STS from standard chair with R heel touch only 2x  10 with good carry over of cuing from proper technique Supine overpressure applied by therapist for increased extension of the L LE 167ft amb with walking stick ; 129ft without AD Supine SAQ, x15 with no weight, x15 with 4# AW donned B LE squats on Total Gym at highest point, x10 R LE only squat on Total Gym at level 13, x10 with active assistance from therapist     PATIENT EDUCATION:  Education details: Patient was educated on diagnosis, anatomy and pathology involved, prognosis, role of PT, and was given an HEP, demonstrating exercise with proper form following verbal and tactile cues, and was given a paper hand out to continue exercise at home. Pt was educated on and agreed to plan of care.  Person educated: Patient Education method: Explanation, Demonstration, Verbal cues, and Handouts Education comprehension: verbalized understanding, returned demonstration, and verbal cues required  HOME EXERCISE PROGRAM: [id number]  ASSESSMENT:  CLINICAL IMPRESSION:  Pt performed well with the exercises.  Pt exhibits significant weakness in the quads, therefore will be focal point in future sessions.  Pt will benefit from manual therapy approaches to assist with ROM deficits during future sessions as well.  Pt tolerated overpressure attempts from therapist during session as noted above.   Pt  will continue to benefit from skilled therapy to address remaining deficits in order to improve overall QoL and return to PLOF.      OBJECTIVE IMPAIRMENTS: Abnormal gait, decreased activity tolerance, decreased balance, decreased coordination, decreased endurance, decreased knowledge of use of DME, decreased mobility, difficulty walking, decreased ROM, decreased strength, increased edema, impaired flexibility, improper body mechanics, postural dysfunction, and pain.   ACTIVITY LIMITATIONS: carrying, lifting, bending, sitting, standing, squatting, stairs, transfers, bed mobility, bathing, and toileting  PARTICIPATION LIMITATIONS: meal prep, cleaning, laundry, driving, shopping, community activity, and yard work  PERSONAL FACTORS: Age, Fitness, Past/current experiences, Sex, and 1-2 comorbidities: L knee ACL repair, chronic L knee pain   are also affecting patient's functional outcome.   REHAB POTENTIAL: Good  CLINICAL DECISION MAKING: Evolving/moderate complexity  EVALUATION COMPLEXITY: Moderate   GOALS: Goals reviewed with patient? Yes  SHORT TERM GOALS: Target date: 07/28/22 Pt will be independent with HEP in order to improve strength and balance in order to decrease fall risk and improve function at home and work.  Baseline:HEP given  Goal status: INITIAL   LONG TERM GOALS: Target date: 09/06/22  Patient will increase FOTO score to 69 to demonstrate predicted increase in functional mobility to complete ADLs Baseline: 49 Goal status: INITIAL  2.  Pt will increase to at least 1.62m/s in order to demonstrate clinically significant independence in community ambulation and PLOF  Baseline: 5 times sit to stand: 15sec 10 meter walk test: without AD: self selected: 0.33m/s; fastest 0.63m/s  Goal status: INITIAL  3.  Pt will demonstrate 5xSTS time of 12 sec or less in order to demonstrate age matched and clinically significant increase in LE strength Baseline: 15sec Goal  status: INITIAL  4.  Pt will demonstrate L knee ROM of 0-120d in order to be able to complete mountain biking (PLOF)  Baseline: 4-94d Goal status: INITIAL  5.  Pt will be able to balance on LLE for at least 51 sec in order to demonstrate 90% of static balance of unaffected side Baseline: 56sec Goal status: INITIAL   PLAN:  PT FREQUENCY: 1-2x/week  PT DURATION: 8 weeks  PLANNED INTERVENTIONS: Therapeutic exercises, Therapeutic activity, Neuromuscular re-education, Balance training, Gait training, Patient/Family education, Self Care, Joint mobilization,  Joint manipulation, Stair training, DME instructions, Dry Needling, Electrical stimulation, Spinal manipulation, Spinal mobilization, Cryotherapy, Moist heat, and Traction  PLAN FOR NEXT SESSION: L knee mobility   Nolon BussingJoshua Jacob Cicero, PT, DPT Physical Therapist - [place]  [facility]  07/19/22, 4:20 PM

## 2022-07-19 ENCOUNTER — Ambulatory Visit: Payer: PPO

## 2022-07-19 DIAGNOSIS — M25562 Pain in left knee: Secondary | ICD-10-CM | POA: Diagnosis not present

## 2022-07-19 DIAGNOSIS — G8929 Other chronic pain: Secondary | ICD-10-CM

## 2022-07-20 ENCOUNTER — Encounter: Payer: Self-pay | Admitting: Orthopaedic Surgery

## 2022-07-20 ENCOUNTER — Other Ambulatory Visit: Payer: Self-pay | Admitting: Orthopaedic Surgery

## 2022-07-20 MED ORDER — OXYCODONE HCL 5 MG PO TABS: 5.0000 mg | ORAL_TABLET | Freq: Four times a day (QID) | ORAL | 0 refills | Status: DC | PRN

## 2022-07-24 ENCOUNTER — Ambulatory Visit: Payer: PPO

## 2022-07-24 DIAGNOSIS — M25562 Pain in left knee: Secondary | ICD-10-CM | POA: Diagnosis not present

## 2022-07-24 NOTE — Therapy (Signed)
OUTPATIENT PHYSICAL THERAPY LOWER EXTREMITY TREATMENT   Patient Name: Kristen Stephenson MRN: [id number] DOB:1950/06/25, 72 y.o., female Today's Date: 07/24/2022  END OF SESSION:  PT End of Session - 07/24/22 0831     Visit Number 9    Number of Visits 17    Date for PT Re-Evaluation 09/07/22    Authorization - Visit Number 9    Authorization - Number of Visits 10    Progress Note Due on Visit 10    PT Start Time 0831    PT Stop Time 0915    PT Time Calculation (min) 44 min    Equipment Utilized During Treatment Gait belt    Activity Tolerance Patient limited by fatigue;Treatment limited secondary to medical complications (Comment)    Behavior During Therapy WFL for tasks assessed/performed               Past Medical History:  Diagnosis Date   Adjustment disorder with anxiety    Anxiety    Arthritis    knee,left   Barrett's esophagus    [facility] Provider Based Lic Grp-Needs Endoscopy in 2023   Cancer    cervix   Cataract    beginning   GERD (gastroesophageal reflux disease)    H/O ovarian cancer    Heart murmur    History of malignant neoplasm of appendix    Hyperlipidemia    Hyperlipidemia    Lung nodule    Osteopenia    Osteopenia after menopause 09/01/2019   dexa 08/2019   Ovarian cancer 2018   Subclinical hypothyroidism 06/15/2019   Past Surgical History:  Procedure Laterality Date   ABDOMINAL HYSTERECTOMY     ANKLE SURGERY  2003   APPENDECTOMY  2018   Cancer/S/P resection and chemo   ARTHROSCOPIC REPAIR ACL Left    BREAST EXCISIONAL BIOPSY     CYST EXCISION Right 1972   Right breast/benign   FEMUR FRACTURE SURGERY Right    Plate with 5 screws   INGUINAL HERNIA REPAIR  11/2007   TONSILLECTOMY     TOTAL KNEE ARTHROPLASTY Left 06/12/2022   Procedure: LEFT TOTAL KNEE ARTHROPLASTY;  Surgeon: Kathryne Hitch, MD;  Location: [facility] OR;  Service: Orthopedics;  Laterality: Left;   VULVAR LESION REMOVAL  07/2007   benign   Patient Active Problem List    Diagnosis Date Noted   Status post total left knee replacement 06/12/2022   Vitamin B12 deficiency 05/14/2022   Long-term current use of proton pump inhibitor therapy 05/03/2022   Lumbar and sacral spondyloarthritis 05/03/2022   Barrett's esophagus without dysplasia 04/19/2021   Postcoital UTI - recurrent 02/03/2020   Pulmonary nodules 09/23/2019   Presbycusis of both ears 09/23/2019   Osteopenia after menopause 09/01/2019   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   Anxiety 09/16/2018   History of ovarian cancer 09/16/2018   Mixed hyperlipidemia 09/16/2018   GERD (gastroesophageal reflux disease) 09/16/2018    PCP: Durward Mallard MD  REFERRING PROVIDER: Magnus Ivan MD  REFERRING DIAG: L TKA  THERAPY DIAG:  Acute pain of left knee  Rationale for Evaluation and Treatment: Rehabilitation  ONSET DATE: 06/12/22  SUBJECTIVE:   SUBJECTIVE STATEMENT:  Pt reports she had a good weekend with planting her bulbs.     PERTINENT HISTORY:  Pt is a 72 year old female presenting s/p L TKE 06/12/22. Pt reports ACL repair 10years ago, that "failed" causing her so seek TKA. Prior to TKA was having trouble completing hiking with her husband without falling from "  my knee would give out". Pt had HHPT for a 4days post op. Reports completing heel slides, prop stretch, standing hip abd and ext, standing on 1 leg, and attempting LAQ. Pt is taking pain medication as prescribed with tylenol as needed. Current and best pain 1/10; worst 6/10. Pt is completing bathing and dressing modi; has walk in shower with grab bars. Husband is completing cooking and cleaning. Husband drives currently as well. Pt is using a walking stick currently and RW  in community- PLOF ind without AD. Pt is retired, enjoys hiking 3-54miles and road biking 5-41miles 3x/week with her husband. Pt has a trip to [place] in September planned in 5days. Pt denies N/V, B&B changes, unexplained weight fluctuation, saddle paresthesia,  fever, night sweats, or unrelenting night pain at this time.   PAIN:  Are you having pain? Yes: NPRS scale: 1/10 Pain location: L knee Pain description: dull, achy Aggravating factors: moving the knee at all, standing Relieving factors: ice machine, and pain medication.   PRECAUTIONS: None  WEIGHT BEARING RESTRICTIONS: No  FALLS:  Has patient fallen in last 6 months? Yes. Number of falls 6 with L knee giving out on hiking trail  LIVING ENVIRONMENT: Lives with: lives with their spouse Lives in: House/apartment Stairs: No Has following equipment at home: Single point cane; RW  OCCUPATION: Retired  PLOF: Independent  PATIENT GOALS: Get back to hiking and biking  NEXT MD VISIT: 07/26/22  OBJECTIVE:   DIAGNOSTIC FINDINGS: none  PATIENT SURVEYS:  FOTO 49; goal 77  COGNITION: Overall cognitive status: Within functional limits for tasks assessed     SENSATION: WFL  EDEMA:  Circumferential: mid patella L43cm R36cm   Muscle length WNL bilat  POSTURE: weight shift right R knee hyperext, "hanging on Y ligament: posture  PALPATION: TTP grossly at L knee; minimal patellar motion all directions  LOWER EXTREMITY ROM:  Active /PROMROM Right eval Left eval  Hip flexion All bilat hip mobility WNL  Hip extension   Hip abduction   Hip adduction   Hip internal rotation   Hip external rotation   Knee flexion 150 94/101  Knee extension -5 4/0  Ankle dorsiflexion    Ankle plantarflexion    Ankle inversion    Ankle eversion     (Blank rows = not tested)  LOWER EXTREMITY MMT:  MMT Right eval Left eval  Hip flexion 4+ 4  Hip extension 4+ 4+  Hip abduction 4+ 4-  Hip adduction    Hip internal rotation 5   Hip external rotation 4+   Knee flexion 5   Knee extension 5   Ankle dorsiflexion 5 5  Ankle plantarflexion    Ankle inversion    Ankle eversion     (Blank rows = not tested)  1RM RLE knee ext 25# knee flex 10# but limited by LLE pain- would be good to  retest   FUNCTIONAL TESTS:  5 times sit to stand: 15sec 10 meter walk test: without AD: self selected: 0.74m/s; fastest 0.33m/s  SLS RLE: 62sec LLE: 18sec  GAIT: Distance walked: 75M Assistive device utilized:  walking stick Level of assistance: SBA Comments: L knee hyperext with stance phase, decreased R step length   TODAY'S TREATMENT: DATE: 07/24/22  TherEx:  Recumbent bike seat 4, 6 min  L6-L8 resist Heel prop stretch with 7.5# weight on knee, 60 sec bouts SAQ with 4# AW each LE, 4x10 each LE Supine SLR with white bolster under heel to prevent extension lag, then progressed  to blue bolster as pt still experiencing significant extension lag,  STS from standard chair with R heel touch only 2x10 with good carry over of cuing from proper technique Leg Press at [place], 45#, 2x10  Ambulation around gym, 131ft without AD Standing UE supported lunges BLE, x5 each  Standing terminal knee extension into physioball at wall, 3 sec holds, x15      PATIENT EDUCATION:  Education details: Patient was educated on diagnosis, anatomy and pathology involved, prognosis, role of PT, and was given an HEP, demonstrating exercise with proper form following verbal and tactile cues, and was given a paper hand out to continue exercise at home. Pt was educated on and agreed to plan of care.  Person educated: Patient Education method: Explanation, Demonstration, Verbal cues, and Handouts Education comprehension: verbalized understanding, returned demonstration, and verbal cues required  HOME EXERCISE PROGRAM: [id number]  ASSESSMENT:  CLINICAL IMPRESSION:  Pt continues to put forth good effort throughout the session.  Pt with noticeable increase in ability to engage the quads, however still unable to perform SLR without extension lag.  Pt given updated exercises as part of her HEP which includes lunges and terminal knee extension with the use of a small physioball.   Pt will continue to benefit  from skilled therapy to address remaining deficits in order to improve overall QoL and return to PLOF.         OBJECTIVE IMPAIRMENTS: Abnormal gait, decreased activity tolerance, decreased balance, decreased coordination, decreased endurance, decreased knowledge of use of DME, decreased mobility, difficulty walking, decreased ROM, decreased strength, increased edema, impaired flexibility, improper body mechanics, postural dysfunction, and pain.   ACTIVITY LIMITATIONS: carrying, lifting, bending, sitting, standing, squatting, stairs, transfers, bed mobility, bathing, and toileting  PARTICIPATION LIMITATIONS: meal prep, cleaning, laundry, driving, shopping, community activity, and yard work  PERSONAL FACTORS: Age, Fitness, Past/current experiences, Sex, and 1-2 comorbidities: L knee ACL repair, chronic L knee pain   are also affecting patient's functional outcome.   REHAB POTENTIAL: Good  CLINICAL DECISION MAKING: Evolving/moderate complexity  EVALUATION COMPLEXITY: Moderate   GOALS: Goals reviewed with patient? Yes  SHORT TERM GOALS: Target date: 07/28/22 Pt will be independent with HEP in order to improve strength and balance in order to decrease fall risk and improve function at home and work.  Baseline:HEP given  Goal status: INITIAL   LONG TERM GOALS: Target date: 09/06/22  Patient will increase FOTO score to 69 to demonstrate predicted increase in functional mobility to complete ADLs Baseline: 49 Goal status: INITIAL  2.  Pt will increase to at least 1.64m/s in order to demonstrate clinically significant independence in community ambulation and PLOF  Baseline: 5 times sit to stand: 15sec 10 meter walk test: without AD: self selected: 0.63m/s; fastest 0.8m/s  Goal status: INITIAL  3.  Pt will demonstrate 5xSTS time of 12 sec or less in order to demonstrate age matched and clinically significant increase in LE strength Baseline: 15sec Goal status: INITIAL  4.  Pt  will demonstrate L knee ROM of 0-120d in order to be able to complete mountain biking (PLOF)  Baseline: 4-94d Goal status: INITIAL  5.  Pt will be able to balance on LLE for at least 51 sec in order to demonstrate 90% of static balance of unaffected side Baseline: 56sec Goal status: INITIAL   PLAN:  PT FREQUENCY: 1-2x/week  PT DURATION: 8 weeks  PLANNED INTERVENTIONS: Therapeutic exercises, Therapeutic activity, Neuromuscular re-education, Balance training, Gait training, Patient/Family education, Self Care, Joint  mobilization, Joint manipulation, Stair training, DME instructions, Dry Needling, Electrical stimulation, Spinal manipulation, Spinal mobilization, Cryotherapy, Moist heat, and Traction  PLAN FOR NEXT SESSION: L knee mobility   Nolon Bussing, PT, DPT Physical Therapist - [place]  [facility]  07/24/22, 12:44 PM

## 2022-07-25 ENCOUNTER — Encounter: Payer: Self-pay | Admitting: Orthopaedic Surgery

## 2022-07-25 ENCOUNTER — Ambulatory Visit: Payer: PPO

## 2022-07-25 ENCOUNTER — Ambulatory Visit (INDEPENDENT_AMBULATORY_CARE_PROVIDER_SITE_OTHER): Payer: Self-pay | Admitting: Orthopaedic Surgery

## 2022-07-25 DIAGNOSIS — Z96652 Presence of left artificial knee joint: Secondary | ICD-10-CM

## 2022-07-25 NOTE — Progress Notes (Signed)
The patient comes in today at 6 weeks status post a left total knee arthroplasty.  She is doing excellent overall.  She does walk with a walking stick.  She is a very young and active 72 year old female.  On exam her extension is full and her flexion is to 120 degrees.  The knee feels limply stable.  There is swelling to be expected.  From my standpoint she is doing excellent.  I will see her back in 3 months with a standing AP and lateral of her left knee.  She is using her oxycodone sparingly.  She is going to try to wean from this but if she does need 1 more she will let me know.

## 2022-07-26 ENCOUNTER — Ambulatory Visit: Payer: PPO

## 2022-07-26 DIAGNOSIS — M25562 Pain in left knee: Secondary | ICD-10-CM

## 2022-07-26 DIAGNOSIS — G8929 Other chronic pain: Secondary | ICD-10-CM

## 2022-07-26 NOTE — Therapy (Signed)
OUTPATIENT PHYSICAL THERAPY LOWER EXTREMITY TREATMENT   Patient Name: Kristen Stephenson MRN: [id number] DOB:Jul 04, 1950, 72 y.o., female Today's Date: 07/26/2022  END OF SESSION:  PT End of Session - 07/26/22 1136     Visit Number 10    Number of Visits 17    Date for PT Re-Evaluation 09/07/22    Authorization - Visit Number 10    Authorization - Number of Visits 10    Progress Note Due on Visit 10    PT Start Time 1136    PT Stop Time 1215    PT Time Calculation (min) 39 min    Equipment Utilized During Treatment Gait belt    Activity Tolerance Patient limited by fatigue;Treatment limited secondary to medical complications (Comment)    Behavior During Therapy WFL for tasks assessed/performed               Past Medical History:  Diagnosis Date   Adjustment disorder with anxiety    Anxiety    Arthritis    knee,left   Barrett's esophagus    [facility] Provider Based Lic Grp-Needs Endoscopy in 2023   Cancer    cervix   Cataract    beginning   GERD (gastroesophageal reflux disease)    H/O ovarian cancer    Heart murmur    History of malignant neoplasm of appendix    Hyperlipidemia    Hyperlipidemia    Lung nodule    Osteopenia    Osteopenia after menopause 09/01/2019   dexa 08/2019   Ovarian cancer 2018   Subclinical hypothyroidism 06/15/2019   Past Surgical History:  Procedure Laterality Date   ABDOMINAL HYSTERECTOMY     ANKLE SURGERY  2003   APPENDECTOMY  2018   Cancer/S/P resection and chemo   ARTHROSCOPIC REPAIR ACL Left    BREAST EXCISIONAL BIOPSY     CYST EXCISION Right 1972   Right breast/benign   FEMUR FRACTURE SURGERY Right    Plate with 5 screws   INGUINAL HERNIA REPAIR  11/2007   TONSILLECTOMY     TOTAL KNEE ARTHROPLASTY Left 06/12/2022   Procedure: LEFT TOTAL KNEE ARTHROPLASTY;  Surgeon: Kathryne Hitch, MD;  Location: [facility] OR;  Service: Orthopedics;  Laterality: Left;   VULVAR LESION REMOVAL  07/2007   benign   Patient Active Problem List    Diagnosis Date Noted   Status post total left knee replacement 06/12/2022   Vitamin B12 deficiency 05/14/2022   Long-term current use of proton pump inhibitor therapy 05/03/2022   Lumbar and sacral spondyloarthritis 05/03/2022   Barrett's esophagus without dysplasia 04/19/2021   Postcoital UTI - recurrent 02/03/2020   Pulmonary nodules 09/23/2019   Presbycusis of both ears 09/23/2019   Osteopenia after menopause 09/01/2019   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   Anxiety 09/16/2018   History of ovarian cancer 09/16/2018   Mixed hyperlipidemia 09/16/2018   GERD (gastroesophageal reflux disease) 09/16/2018    PCP: Durward Mallard MD  REFERRING PROVIDER: Magnus Ivan MD  REFERRING DIAG: L TKA  THERAPY DIAG:  Acute pain of left knee  Chronic left-sided low back pain with left-sided sciatica  Rationale for Evaluation and Treatment: Rehabilitation  ONSET DATE: 06/12/22  SUBJECTIVE:   SUBJECTIVE STATEMENT:  Pt reports that her visit with the MD went well and he is pleased with the results at this point.   PERTINENT HISTORY:  Pt is a 72 year old female presenting s/p L TKE 06/12/22. Pt reports ACL repair 10years ago, that "failed" causing her so  seek TKA. Prior to TKA was having trouble completing hiking with her husband without falling from "my knee would give out". Pt had HHPT for a 4days post op. Reports completing heel slides, prop stretch, standing hip abd and ext, standing on 1 leg, and attempting LAQ. Pt is taking pain medication as prescribed with tylenol as needed. Current and best pain 1/10; worst 6/10. Pt is completing bathing and dressing modi; has walk in shower with grab bars. Husband is completing cooking and cleaning. Husband drives currently as well. Pt is using a walking stick currently and RW  in community- PLOF ind without AD. Pt is retired, enjoys hiking 3-30miles and road biking 5-72miles 3x/week with her husband. Pt has a trip to [place] in September planned  in 5days. Pt denies N/V, B&B changes, unexplained weight fluctuation, saddle paresthesia, fever, night sweats, or unrelenting night pain at this time.   PAIN:  Are you having pain? Yes: NPRS scale: 1/10 Pain location: L knee Pain description: dull, achy Aggravating factors: moving the knee at all, standing Relieving factors: ice machine, and pain medication.   PRECAUTIONS: None  WEIGHT BEARING RESTRICTIONS: No  FALLS:  Has patient fallen in last 6 months? Yes. Number of falls 6 with L knee giving out on hiking trail  LIVING ENVIRONMENT: Lives with: lives with their spouse Lives in: House/apartment Stairs: No Has following equipment at home: Single point cane; RW  OCCUPATION: Retired  PLOF: Independent  PATIENT GOALS: Get back to hiking and biking  NEXT MD VISIT: 07/26/22  OBJECTIVE:   DIAGNOSTIC FINDINGS: none  PATIENT SURVEYS:  FOTO 49; goal 2  COGNITION: Overall cognitive status: Within functional limits for tasks assessed     SENSATION: WFL  EDEMA:  Circumferential: mid patella L43cm R36cm   Muscle length WNL bilat  POSTURE: weight shift right R knee hyperext, "hanging on Y ligament: posture  PALPATION: TTP grossly at L knee; minimal patellar motion all directions  LOWER EXTREMITY ROM:  Active /PROMROM Right eval Left eval  Hip flexion All bilat hip mobility WNL  Hip extension   Hip abduction   Hip adduction   Hip internal rotation   Hip external rotation   Knee flexion 150 94/101  Knee extension -5 4/0  Ankle dorsiflexion    Ankle plantarflexion    Ankle inversion    Ankle eversion     (Blank rows = not tested)  LOWER EXTREMITY MMT:  MMT Right eval Left eval  Hip flexion 4+ 4  Hip extension 4+ 4+  Hip abduction 4+ 4-  Hip adduction    Hip internal rotation 5   Hip external rotation 4+   Knee flexion 5   Knee extension 5   Ankle dorsiflexion 5 5  Ankle plantarflexion    Ankle inversion    Ankle eversion     (Blank  rows = not tested)  1RM RLE knee ext 25# knee flex 10# but limited by LLE pain- would be good to retest   FUNCTIONAL TESTS:  5 times sit to stand: 15sec 10 meter walk test: without AD: self selected: 0.37m/s; fastest 0.59m/s  SLS RLE: 62sec LLE: 18sec  GAIT: Distance walked: 73M Assistive device utilized:  walking stick Level of assistance: SBA Comments: L knee hyperext with stance phase, decreased R step length    TODAY'S TREATMENT: DATE: 07/26/22   TherAct:  Goal assessment performed as part of progress note and reported on below:  Laps around the gym without AD, focused on proper body mechanics  and gait mechanics   PATIENT EDUCATION:  Education details: Patient was educated on diagnosis, anatomy and pathology involved, prognosis, role of PT, and was given an HEP, demonstrating exercise with proper form following verbal and tactile cues, and was given a paper hand out to continue exercise at home. Pt was educated on and agreed to plan of care.  Person educated: Patient Education method: Programmer, multimedia, Demonstration, Verbal cues, and Handouts Education comprehension: verbalized understanding, returned demonstration, and verbal cues required  HOME EXERCISE PROGRAM: [id number]  ASSESSMENT:  CLINICAL IMPRESSION:  Pt has made significant improvement with all related goals and is able to ambulate much better during treatment session as well.  Pt still lacking on some balance training, but will benefit from increased strength training, and balance training to improve the proprioception of the L knee.  Pt also still exhibiting weakness of the rectus femoris and will be focused on during subsequent treatment sessions.  Patient's condition has the potential to improve in response to therapy. Maximum improvement is yet to be obtained. The anticipated improvement is attainable and reasonable in a generally predictable time.   Pt will continue to benefit from skilled therapy to address  remaining deficits in order to improve overall QoL and return to PLOF.        OBJECTIVE IMPAIRMENTS: Abnormal gait, decreased activity tolerance, decreased balance, decreased coordination, decreased endurance, decreased knowledge of use of DME, decreased mobility, difficulty walking, decreased ROM, decreased strength, increased edema, impaired flexibility, improper body mechanics, postural dysfunction, and pain.   ACTIVITY LIMITATIONS: carrying, lifting, bending, sitting, standing, squatting, stairs, transfers, bed mobility, bathing, and toileting  PARTICIPATION LIMITATIONS: meal prep, cleaning, laundry, driving, shopping, community activity, and yard work  PERSONAL FACTORS: Age, Fitness, Past/current experiences, Sex, and 1-2 comorbidities: L knee ACL repair, chronic L knee pain   are also affecting patient's functional outcome.   REHAB POTENTIAL: Good  CLINICAL DECISION MAKING: Evolving/moderate complexity  EVALUATION COMPLEXITY: Moderate   GOALS: Goals reviewed with patient? Yes  SHORT TERM GOALS: Target date: 07/28/22 Pt will be independent with HEP in order to improve strength and balance in order to decrease fall risk and improve function at home and work.  Baseline:HEP given  Goal status: INITIAL   LONG TERM GOALS: Target date: 09/06/22  Patient will increase FOTO score to 69 to demonstrate predicted increase in functional mobility to complete ADLs Baseline: 49 07/26/22: 76 Goal status: MET  2.  Pt will increase to at least 1.5m/s in order to demonstrate clinically significant independence in community ambulation and PLOF Baseline: 10 meter walk test: without AD: self selected: 0.13m/s; fastest 0.52m/s  07/26/22: 10 meter walk test: without AD: self selected: 0.57m/s; fastest 1.73m/s  Goal status: IN PROGRESS  3.  Pt will demonstrate 5xSTS time of 12 sec or less in order to demonstrate age matched and clinically significant increase in LE strength Baseline: 15  sec 07/26/22: 12.86 sec Goal status: INITIAL  4.  Pt will demonstrate L knee ROM of 0-120d in order to be able to complete mountain biking (PLOF)  Baseline: 4-94d 07/26/22: 0-117 deg Goal status: INITIAL  5.  Pt will be able to balance on LLE for at least 51 sec in order to demonstrate 90% of static balance of unaffected side Baseline: 56 sec 07/26/22: 80.43 sec Goal status: MET   PLAN:  PT FREQUENCY: 1-2x/week  PT DURATION: 8 weeks  PLANNED INTERVENTIONS: Therapeutic exercises, Therapeutic activity, Neuromuscular re-education, Balance training, Gait training, Patient/Family education, Self Care, Joint mobilization,  Joint manipulation, Stair training, DME instructions, Dry Needling, Electrical stimulation, Spinal manipulation, Spinal mobilization, Cryotherapy, Moist heat, and Traction  PLAN FOR NEXT SESSION: L knee mobility   Nolon Bussing, PT, DPT Physical Therapist - [place]  [facility]  07/26/22, 4:31 PM

## 2022-07-30 ENCOUNTER — Telehealth: Payer: Self-pay | Admitting: Family Medicine

## 2022-07-30 NOTE — Telephone Encounter (Signed)
Contacted Kristen Stephenson to schedule their annual wellness visit. Patient declined to schedule AWV at this time.  Patient declined does not want to participate in AWV.  [name] AWV TEAM Direct Dial [phone]

## 2022-07-31 ENCOUNTER — Ambulatory Visit: Payer: PPO

## 2022-07-31 DIAGNOSIS — M25562 Pain in left knee: Secondary | ICD-10-CM

## 2022-07-31 NOTE — Therapy (Addendum)
OUTPATIENT PHYSICAL THERAPY LOWER EXTREMITY TREATMENT   Patient Name: Kristen Stephenson MRN: [id number] DOB:Dec 20, 1950, 72 y.o., female Today's Date: 07/31/2022  END OF SESSION:  PT End of Session - 07/31/22 1128     Visit Number 11    Number of Visits 17    Date for PT Re-Evaluation 09/07/22    Authorization - Visit Number 11    Authorization - Number of Visits 10    Progress Note Due on Visit 20    PT Start Time 1130    PT Stop Time 1213    PT Time Calculation (min) 43 min    Equipment Utilized During Treatment Gait belt    Activity Tolerance Patient limited by fatigue;Treatment limited secondary to medical complications (Comment)    Behavior During Therapy WFL for tasks assessed/performed               Past Medical History:  Diagnosis Date   Adjustment disorder with anxiety    Anxiety    Arthritis    knee,left   Barrett's esophagus    [facility] Provider Based Lic Grp-Needs Endoscopy in 2023   Cancer    cervix   Cataract    beginning   GERD (gastroesophageal reflux disease)    H/O ovarian cancer    Heart murmur    History of malignant neoplasm of appendix    Hyperlipidemia    Hyperlipidemia    Lung nodule    Osteopenia    Osteopenia after menopause 09/01/2019   dexa 08/2019   Ovarian cancer 2018   Subclinical hypothyroidism 06/15/2019   Past Surgical History:  Procedure Laterality Date   ABDOMINAL HYSTERECTOMY     ANKLE SURGERY  2003   APPENDECTOMY  2018   Cancer/S/P resection and chemo   ARTHROSCOPIC REPAIR ACL Left    BREAST EXCISIONAL BIOPSY     CYST EXCISION Right 1972   Right breast/benign   FEMUR FRACTURE SURGERY Right    Plate with 5 screws   INGUINAL HERNIA REPAIR  11/2007   TONSILLECTOMY     TOTAL KNEE ARTHROPLASTY Left 06/12/2022   Procedure: LEFT TOTAL KNEE ARTHROPLASTY;  Surgeon: Kathryne Hitch, MD;  Location: [facility] OR;  Service: Orthopedics;  Laterality: Left;   VULVAR LESION REMOVAL  07/2007   benign   Patient Active Problem List    Diagnosis Date Noted   Status post total left knee replacement 06/12/2022   Vitamin B12 deficiency 05/14/2022   Long-term current use of proton pump inhibitor therapy 05/03/2022   Lumbar and sacral spondyloarthritis 05/03/2022   Barrett's esophagus without dysplasia 04/19/2021   Postcoital UTI - recurrent 02/03/2020   Pulmonary nodules 09/23/2019   Presbycusis of both ears 09/23/2019   Osteopenia after menopause 09/01/2019   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   Anxiety 09/16/2018   History of ovarian cancer 09/16/2018   Mixed hyperlipidemia 09/16/2018   GERD (gastroesophageal reflux disease) 09/16/2018    PCP: Durward Mallard MD  REFERRING PROVIDER: Magnus Ivan MD  REFERRING DIAG: L TKA  THERAPY DIAG:  Acute pain of left knee  Rationale for Evaluation and Treatment: Rehabilitation  ONSET DATE: 06/12/22  SUBJECTIVE:   SUBJECTIVE STATEMENT: Patient reports stiffness in the mornings. She did some gardening over the weekend. Reports has been walking without walking sticks for about week.    PERTINENT HISTORY:  Pt is a 72 year old female presenting s/p L TKE 06/12/22. Pt reports ACL repair 10years ago, that "failed" causing her so seek TKA. Prior to TKA was  having trouble completing hiking with her husband without falling from "my knee would give out". Pt had HHPT for a 4days post op. Reports completing heel slides, prop stretch, standing hip abd and ext, standing on 1 leg, and attempting LAQ. Pt is taking pain medication as prescribed with tylenol as needed. Current and best pain 1/10; worst 6/10. Pt is completing bathing and dressing modi; has walk in shower with grab bars. Husband is completing cooking and cleaning. Husband drives currently as well. Pt is using a walking stick currently and RW  in community- PLOF ind without AD. Pt is retired, enjoys hiking 3-52miles and road biking 5-17miles 3x/week with her husband. Pt has a trip to [place] in September planned in  5days. Pt denies N/V, B&B changes, unexplained weight fluctuation, saddle paresthesia, fever, night sweats, or unrelenting night pain at this time.   PAIN:  Are you having pain? Yes: NPRS scale: 1/10 Pain location: L knee Pain description: dull, achy Aggravating factors: moving the knee at all, standing Relieving factors: ice machine, and pain medication.   PRECAUTIONS: None  WEIGHT BEARING RESTRICTIONS: No  FALLS:  Has patient fallen in last 6 months? Yes. Number of falls 6 with L knee giving out on hiking trail  LIVING ENVIRONMENT: Lives with: lives with their spouse Lives in: House/apartment Stairs: No Has following equipment at home: Single point cane; RW  OCCUPATION: Retired  PLOF: Independent  PATIENT GOALS: Get back to hiking and biking  NEXT MD VISIT: 07/26/22  OBJECTIVE:   DIAGNOSTIC FINDINGS: none  PATIENT SURVEYS:  FOTO 49; goal 85  COGNITION: Overall cognitive status: Within functional limits for tasks assessed     SENSATION: WFL  EDEMA:  Circumferential: mid patella L43cm R36cm   Muscle length WNL bilat  POSTURE: weight shift right R knee hyperext, "hanging on Y ligament: posture  PALPATION: TTP grossly at L knee; minimal patellar motion all directions  LOWER EXTREMITY ROM:  Active /PROMROM Right eval Left eval  Hip flexion All bilat hip mobility WNL  Hip extension   Hip abduction   Hip adduction   Hip internal rotation   Hip external rotation   Knee flexion 150 94/101  Knee extension -5 4/0  Ankle dorsiflexion    Ankle plantarflexion    Ankle inversion    Ankle eversion     (Blank rows = not tested)  LOWER EXTREMITY MMT:  MMT Right eval Left eval  Hip flexion 4+ 4  Hip extension 4+ 4+  Hip abduction 4+ 4-  Hip adduction    Hip internal rotation 5   Hip external rotation 4+   Knee flexion 5   Knee extension 5   Ankle dorsiflexion 5 5  Ankle plantarflexion    Ankle inversion    Ankle eversion     (Blank rows = not  tested)  1RM RLE knee ext 25# knee flex 10# but limited by LLE pain- would be good to retest   FUNCTIONAL TESTS:  5 times sit to stand: 15sec 10 meter walk test: without AD: self selected: 0.72m/s; fastest 0.51m/s  SLS RLE: 62sec LLE: 18sec  GAIT: Distance walked: 54M Assistive device utilized:  walking stick Level of assistance: SBA Comments: L knee hyperext with stance phase, decreased R step length   TODAY'S TREATMENT: DATE: 07/31/22  TherEx:  Recumbent bike seat 4, 6 min  L5-7 resistance TRX squats x 15 TRX reverse lunge leading with each LE  x 10 Leg Press at Omega 45# 2 x 10, cues  for slow controlled movement  Lateral step down on 6 inch step x 10 each LE  Step ups on 6 inch step x 10 leading with each LE Step over blue dynadisc x 10 leading with each LE  STS from standard chair with R heel touch only 2x15 with good carry over of cuing from proper technique Standing marching with 3# AW x 20 each LE Standing hip abduction 4# AW 2 x 15 each LE  Heel/toe raises 4# AW x 20 each    PATIENT EDUCATION:  Education details: Patient was educated on diagnosis, anatomy and pathology involved, prognosis, role of PT, and was given an HEP, demonstrating exercise with proper form following verbal and tactile cues, and was given a paper hand out to continue exercise at home. Pt was educated on and agreed to plan of care.  Person educated: Patient Education method: Explanation, Demonstration, Verbal cues, and Handouts Education comprehension: verbalized understanding, returned demonstration, and verbal cues required  HOME EXERCISE PROGRAM: [id number]  ASSESSMENT:  CLINICAL IMPRESSION: Patient continues to have excellent motivation and making good progress towards physical therapy goals. Patient tolerated increase in intensity of exercises. Continues to be limited by hip and knee weakness. Increased difficulty with exercises in single leg stance. Educated patient to perform HEP  exercises on B LE for continued strengthening bilaterally. Patient will continue to benefit from skilled therapy to address remaining impairments to improve overall quality of life.    OBJECTIVE IMPAIRMENTS: Abnormal gait, decreased activity tolerance, decreased balance, decreased coordination, decreased endurance, decreased knowledge of use of DME, decreased mobility, difficulty walking, decreased ROM, decreased strength, increased edema, impaired flexibility, improper body mechanics, postural dysfunction, and pain.   ACTIVITY LIMITATIONS: carrying, lifting, bending, sitting, standing, squatting, stairs, transfers, bed mobility, bathing, and toileting  PARTICIPATION LIMITATIONS: meal prep, cleaning, laundry, driving, shopping, community activity, and yard work  PERSONAL FACTORS: Age, Fitness, Past/current experiences, Sex, and 1-2 comorbidities: L knee ACL repair, chronic L knee pain   are also affecting patient's functional outcome.   REHAB POTENTIAL: Good  CLINICAL DECISION MAKING: Evolving/moderate complexity  EVALUATION COMPLEXITY: Moderate   GOALS: Goals reviewed with patient? Yes  SHORT TERM GOALS: Target date: 07/28/22 Pt will be independent with HEP in order to improve strength and balance in order to decrease fall risk and improve function at home and work.  Baseline:HEP given  Goal status: INITIAL   LONG TERM GOALS: Target date: 09/06/22  Patient will increase FOTO score to 69 to demonstrate predicted increase in functional mobility to complete ADLs Baseline: 49 Goal status: INITIAL  2.  Pt will increase to at least 1.23m/s in order to demonstrate clinically significant independence in community ambulation and PLOF  Baseline: 5 times sit to stand: 15sec 10 meter walk test: without AD: self selected: 0.35m/s; fastest 0.66m/s  Goal status: INITIAL  3.  Pt will demonstrate 5xSTS time of 12 sec or less in order to demonstrate age matched and clinically significant  increase in LE strength Baseline: 15sec Goal status: INITIAL  4.  Pt will demonstrate L knee ROM of 0-120d in order to be able to complete mountain biking (PLOF)  Baseline: 4-94d Goal status: INITIAL  5.  Pt will be able to balance on LLE for at least 51 sec in order to demonstrate 90% of static balance of unaffected side Baseline: 56sec Goal status: INITIAL   PLAN:  PT FREQUENCY: 1-2x/week  PT DURATION: 8 weeks  PLANNED INTERVENTIONS: Therapeutic exercises, Therapeutic activity, Neuromuscular re-education, Balance training, Gait  training, Patient/Family education, Self Care, Joint mobilization, Joint manipulation, Stair training, DME instructions, Dry Needling, Electrical stimulation, Spinal manipulation, Spinal mobilization, Cryotherapy, Moist heat, and Traction  PLAN FOR NEXT SESSION: Sciatic nerve stretches, dynamic balance/strengthening   Samentha Perham A. Dan Humphreys PT, DPT Physical Therapist - [facility]  07/31/22, 2:48 PM

## 2022-08-02 ENCOUNTER — Ambulatory Visit: Payer: PPO

## 2022-08-02 DIAGNOSIS — M25562 Pain in left knee: Secondary | ICD-10-CM | POA: Diagnosis not present

## 2022-08-02 DIAGNOSIS — G8929 Other chronic pain: Secondary | ICD-10-CM

## 2022-08-02 NOTE — Therapy (Signed)
OUTPATIENT PHYSICAL THERAPY LOWER EXTREMITY TREATMENT   Patient Name: Kristen Stephenson MRN: [id number] DOB:04-Mar-1951, 72 y.o., female Today's Date: 08/02/2022  END OF SESSION:  PT End of Session - 08/02/22 1517     Visit Number 12    Number of Visits 17    Date for PT Re-Evaluation 09/07/22    Authorization - Visit Number 12    Authorization - Number of Visits 10    Progress Note Due on Visit 20    PT Start Time 1517    PT Stop Time 1600    PT Time Calculation (min) 43 min    Equipment Utilized During Treatment Gait belt    Activity Tolerance Patient limited by fatigue;Treatment limited secondary to medical complications (Comment)    Behavior During Therapy WFL for tasks assessed/performed              Past Medical History:  Diagnosis Date   Adjustment disorder with anxiety    Anxiety    Arthritis    knee,left   Barrett's esophagus    [facility] Provider Based Lic Grp-Needs Endoscopy in 2023   Cancer    cervix   Cataract    beginning   GERD (gastroesophageal reflux disease)    H/O ovarian cancer    Heart murmur    History of malignant neoplasm of appendix    Hyperlipidemia    Hyperlipidemia    Lung nodule    Osteopenia    Osteopenia after menopause 09/01/2019   dexa 08/2019   Ovarian cancer 2018   Subclinical hypothyroidism 06/15/2019   Past Surgical History:  Procedure Laterality Date   ABDOMINAL HYSTERECTOMY     ANKLE SURGERY  2003   APPENDECTOMY  2018   Cancer/S/P resection and chemo   ARTHROSCOPIC REPAIR ACL Left    BREAST EXCISIONAL BIOPSY     CYST EXCISION Right 1972   Right breast/benign   FEMUR FRACTURE SURGERY Right    Plate with 5 screws   INGUINAL HERNIA REPAIR  11/2007   TONSILLECTOMY     TOTAL KNEE ARTHROPLASTY Left 06/12/2022   Procedure: LEFT TOTAL KNEE ARTHROPLASTY;  Surgeon: Kathryne Hitch, MD;  Location: [facility] OR;  Service: Orthopedics;  Laterality: Left;   VULVAR LESION REMOVAL  07/2007   benign   Patient Active Problem List    Diagnosis Date Noted   Status post total left knee replacement 06/12/2022   Vitamin B12 deficiency 05/14/2022   Long-term current use of proton pump inhibitor therapy 05/03/2022   Lumbar and sacral spondyloarthritis 05/03/2022   Barrett's esophagus without dysplasia 04/19/2021   Postcoital UTI - recurrent 02/03/2020   Pulmonary nodules 09/23/2019   Presbycusis of both ears 09/23/2019   Osteopenia after menopause 09/01/2019   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   Anxiety 09/16/2018   History of ovarian cancer 09/16/2018   Mixed hyperlipidemia 09/16/2018   GERD (gastroesophageal reflux disease) 09/16/2018    PCP: Durward Mallard MD  REFERRING PROVIDER: Magnus Ivan MD  REFERRING DIAG: L TKA  THERAPY DIAG:  Acute pain of left knee  Chronic left-sided low back pain with left-sided sciatica  Rationale for Evaluation and Treatment: Rehabilitation  ONSET DATE: 06/12/22  SUBJECTIVE:   SUBJECTIVE STATEMENT:  Pt reports she is doing well with her exercises and is hoping to get back on her bike soon.  Pt encouraged to perform and check back in with therapist to assess progress.    PERTINENT HISTORY:  Pt is a 72 year old female presenting s/p L  TKE 06/12/22. Pt reports ACL repair 10years ago, that "failed" causing her so seek TKA. Prior to TKA was having trouble completing hiking with her husband without falling from "my knee would give out". Pt had HHPT for a 4days post op. Reports completing heel slides, prop stretch, standing hip abd and ext, standing on 1 leg, and attempting LAQ. Pt is taking pain medication as prescribed with tylenol as needed. Current and best pain 1/10; worst 6/10. Pt is completing bathing and dressing modi; has walk in shower with grab bars. Husband is completing cooking and cleaning. Husband drives currently as well. Pt is using a walking stick currently and RW  in community- PLOF ind without AD. Pt is retired, enjoys hiking 3-71miles and road biking 5-37miles  3x/week with her husband. Pt has a trip to [place] in September planned in 5days. Pt denies N/V, B&B changes, unexplained weight fluctuation, saddle paresthesia, fever, night sweats, or unrelenting night pain at this time.   PAIN:  Are you having pain? Yes: NPRS scale: 1/10 Pain location: L knee Pain description: dull, achy Aggravating factors: moving the knee at all, standing Relieving factors: ice machine, and pain medication.   PRECAUTIONS: None  WEIGHT BEARING RESTRICTIONS: No  FALLS:  Has patient fallen in last 6 months? Yes. Number of falls 6 with L knee giving out on hiking trail  LIVING ENVIRONMENT: Lives with: lives with their spouse Lives in: House/apartment Stairs: No Has following equipment at home: Single point cane; RW  OCCUPATION: Retired  PLOF: Independent  PATIENT GOALS: Get back to hiking and biking  NEXT MD VISIT: 07/26/22  OBJECTIVE:   DIAGNOSTIC FINDINGS: none  PATIENT SURVEYS:  FOTO 49; goal 62  COGNITION: Overall cognitive status: Within functional limits for tasks assessed     SENSATION: WFL  EDEMA:  Circumferential: mid patella L43cm R36cm   Muscle length WNL bilat  POSTURE: weight shift right R knee hyperext, "hanging on Y ligament: posture  PALPATION: TTP grossly at L knee; minimal patellar motion all directions  LOWER EXTREMITY ROM:  Active /PROMROM Right eval Left eval  Hip flexion All bilat hip mobility WNL  Hip extension   Hip abduction   Hip adduction   Hip internal rotation   Hip external rotation   Knee flexion 150 94/101  Knee extension -5 4/0  Ankle dorsiflexion    Ankle plantarflexion    Ankle inversion    Ankle eversion     (Blank rows = not tested)  LOWER EXTREMITY MMT:  MMT Right eval Left eval  Hip flexion 4+ 4  Hip extension 4+ 4+  Hip abduction 4+ 4-  Hip adduction    Hip internal rotation 5   Hip external rotation 4+   Knee flexion 5   Knee extension 5   Ankle dorsiflexion 5 5   Ankle plantarflexion    Ankle inversion    Ankle eversion     (Blank rows = not tested)  1RM RLE knee ext 25# knee flex 10# but limited by LLE pain- would be good to retest   FUNCTIONAL TESTS:  5 times sit to stand: 15sec 10 meter walk test: without AD: self selected: 0.89m/s; fastest 0.49m/s  SLS RLE: 62sec LLE: 18sec  GAIT: Distance walked: 69M Assistive device utilized:  walking stick Level of assistance: SBA Comments: L knee hyperext with stance phase, decreased R step length   TODAY'S TREATMENT: DATE: 08/02/22  TherEx:  Recumbent bike seat 4, 6 min  L5-8 resistance TRX squats 2x10 TRX  reverse lunge leading with each LE  x10 Leg Press at Omega 55# 2 x 10, cues for slow controlled movement  Leg Press at Omega 15# on L LE, 45# on R LE, x10 Lateral step down on 6 inch step x 10 each LE  Step ups on 6 inch step x 10 leading with each LE Eccentric step downs 6 inch step, x10 each LE leading STS from standard chair with R heel touch only 2x15 with good carry over of cuing from proper technique Wall squats, 3 total attempts, 12 sec, 18 sec, 14 sec Heel/toe raises 4# AW x 20 each    PATIENT EDUCATION:  Education details: Patient was educated on diagnosis, anatomy and pathology involved, prognosis, role of PT, and was given an HEP, demonstrating exercise with proper form following verbal and tactile cues, and was given a paper hand out to continue exercise at home. Pt was educated on and agreed to plan of care.  Person educated: Patient Education method: Explanation, Demonstration, Verbal cues, and Handouts Education comprehension: verbalized understanding, returned demonstration, and verbal cues required  HOME EXERCISE PROGRAM: [id number]  ASSESSMENT:  CLINICAL IMPRESSION:  Pt continues to perform well with the exercises and is making good improvements in strength at this time.  Pt still lacking adequate rectus femoris muscle recruitment at times and would benefit from  performing potential e-stim [place] setting on quads to activate quads at future session.  Pt educated on proper form for exercises at home and will continue to improve while maintaining ROM.   Pt will continue to benefit from skilled therapy to address remaining deficits in order to improve overall QoL and return to PLOF.      OBJECTIVE IMPAIRMENTS: Abnormal gait, decreased activity tolerance, decreased balance, decreased coordination, decreased endurance, decreased knowledge of use of DME, decreased mobility, difficulty walking, decreased ROM, decreased strength, increased edema, impaired flexibility, improper body mechanics, postural dysfunction, and pain.   ACTIVITY LIMITATIONS: carrying, lifting, bending, sitting, standing, squatting, stairs, transfers, bed mobility, bathing, and toileting  PARTICIPATION LIMITATIONS: meal prep, cleaning, laundry, driving, shopping, community activity, and yard work  PERSONAL FACTORS: Age, Fitness, Past/current experiences, Sex, and 1-2 comorbidities: L knee ACL repair, chronic L knee pain   are also affecting patient's functional outcome.   REHAB POTENTIAL: Good  CLINICAL DECISION MAKING: Evolving/moderate complexity  EVALUATION COMPLEXITY: Moderate   GOALS: Goals reviewed with patient? Yes  SHORT TERM GOALS: Target date: 07/28/22 Pt will be independent with HEP in order to improve strength and balance in order to decrease fall risk and improve function at home and work.  Baseline:HEP given  Goal status: INITIAL   LONG TERM GOALS: Target date: 09/06/22  Patient will increase FOTO score to 69 to demonstrate predicted increase in functional mobility to complete ADLs Baseline: 49 Goal status: INITIAL  2.  Pt will increase to at least 1.35m/s in order to demonstrate clinically significant independence in community ambulation and PLOF  Baseline: 5 times sit to stand: 15sec 10 meter walk test: without AD: self selected: 0.34m/s; fastest  0.60m/s  Goal status: INITIAL  3.  Pt will demonstrate 5xSTS time of 12 sec or less in order to demonstrate age matched and clinically significant increase in LE strength Baseline: 15sec Goal status: INITIAL  4.  Pt will demonstrate L knee ROM of 0-120d in order to be able to complete mountain biking (PLOF)  Baseline: 4-94d Goal status: INITIAL  5.  Pt will be able to balance on LLE for at least  51 sec in order to demonstrate 90% of static balance of unaffected side Baseline: 56sec Goal status: INITIAL   PLAN:  PT FREQUENCY: 1-2x/week  PT DURATION: 8 weeks  PLANNED INTERVENTIONS: Therapeutic exercises, Therapeutic activity, Neuromuscular re-education, Balance training, Gait training, Patient/Family education, Self Care, Joint mobilization, Joint manipulation, Stair training, DME instructions, Dry Needling, Electrical stimulation, Spinal manipulation, Spinal mobilization, Cryotherapy, Moist heat, and Traction  PLAN FOR NEXT SESSION:   Sciatic nerve stretches, dynamic balance/strengthening    Nolon Bussing, PT, DPT Physical Therapist - [facility]  08/02/22, 4:28 PM

## 2022-08-07 ENCOUNTER — Ambulatory Visit: Payer: PPO

## 2022-08-07 ENCOUNTER — Other Ambulatory Visit: Payer: Self-pay | Admitting: Sports Medicine

## 2022-08-07 DIAGNOSIS — G8929 Other chronic pain: Secondary | ICD-10-CM

## 2022-08-07 DIAGNOSIS — M25562 Pain in left knee: Secondary | ICD-10-CM | POA: Diagnosis not present

## 2022-08-07 NOTE — Therapy (Signed)
OUTPATIENT PHYSICAL THERAPY LOWER EXTREMITY TREATMENT   Patient Name: Kristen Stephenson MRN: [id number] DOB:05/03/1950, 72 y.o., female Today's Date: 08/07/2022  END OF SESSION:   PT End of Session - 08/07/22 1352     Visit Number 13    Number of Visits 17    Date for PT Re-Evaluation 09/07/22    Authorization - Visit Number 13    Authorization - Number of Visits 10    Progress Note Due on Visit 20    PT Start Time 1349    PT Stop Time 1430    PT Time Calculation (min) 41 min    Equipment Utilized During Treatment Gait belt    Activity Tolerance Patient limited by fatigue;Treatment limited secondary to medical complications (Comment)    Behavior During Therapy WFL for tasks assessed/performed              Past Medical History:  Diagnosis Date   Adjustment disorder with anxiety    Anxiety    Arthritis    knee,left   Barrett's esophagus    [facility] Provider Based Lic Grp-Needs Endoscopy in 2023   Cancer (HCC)    cervix   Cataract    beginning   GERD (gastroesophageal reflux disease)    H/O ovarian cancer    Heart murmur    History of malignant neoplasm of appendix    Hyperlipidemia    Hyperlipidemia    Lung nodule    Osteopenia    Osteopenia after menopause 09/01/2019   dexa 08/2019   Ovarian cancer (HCC) 2018   Subclinical hypothyroidism 06/15/2019   Past Surgical History:  Procedure Laterality Date   ABDOMINAL HYSTERECTOMY     ANKLE SURGERY  2003   APPENDECTOMY  2018   Cancer/S/P resection and chemo   ARTHROSCOPIC REPAIR ACL Left    BREAST EXCISIONAL BIOPSY     CYST EXCISION Right 1972   Right breast/benign   FEMUR FRACTURE SURGERY Right    Plate with 5 screws   INGUINAL HERNIA REPAIR  11/2007   TONSILLECTOMY     TOTAL KNEE ARTHROPLASTY Left 06/12/2022   Procedure: LEFT TOTAL KNEE ARTHROPLASTY;  Surgeon: Kathryne Hitch, MD;  Location: [facility] OR;  Service: Orthopedics;  Laterality: Left;   VULVAR LESION REMOVAL  07/2007   benign   Patient Active  Problem List   Diagnosis Date Noted   Status post total left knee replacement 06/12/2022   Vitamin B12 deficiency 05/14/2022   Long-term current use of proton pump inhibitor therapy 05/03/2022   Lumbar and sacral spondyloarthritis 05/03/2022   Barrett's esophagus without dysplasia 04/19/2021   Postcoital UTI - recurrent 02/03/2020   Pulmonary nodules 09/23/2019   Presbycusis of both ears 09/23/2019   Osteopenia after menopause 09/01/2019   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   Anxiety 09/16/2018   History of ovarian cancer 09/16/2018   Mixed hyperlipidemia 09/16/2018   GERD (gastroesophageal reflux disease) 09/16/2018    PCP: Durward Mallard MD  REFERRING PROVIDER: Magnus Ivan MD  REFERRING DIAG: L TKA  THERAPY DIAG:  Acute pain of left knee  Chronic left-sided low back pain with left-sided sciatica  Rationale for Evaluation and Treatment: Rehabilitation  ONSET DATE: 06/12/22  SUBJECTIVE:   SUBJECTIVE STATEMENT:  Pt reports she was able to go to the [employer].  Pt noted it was more difficult navigating on the different traverse [place].  Pt otherwise doing well.    PERTINENT HISTORY:  Pt is a 72 year old female presenting s/p L  TKE 06/12/22. Pt reports ACL repair 10years ago, that "failed" causing her so seek TKA. Prior to TKA was having trouble completing hiking with her husband without falling from "my knee would give out". Pt had HHPT for a 4days post op. Reports completing heel slides, prop stretch, standing hip abd and ext, standing on 1 leg, and attempting LAQ. Pt is taking pain medication as prescribed with tylenol as needed. Current and best pain 1/10; worst 6/10. Pt is completing bathing and dressing modi; has walk in shower with grab bars. Husband is completing cooking and cleaning. Husband drives currently as well. Pt is using a walking stick currently and RW  in community- PLOF ind without AD. Pt is retired, enjoys hiking 3-88miles and road biking  5-46miles 3x/week with her husband. Pt has a trip to [place] in September planned in 5days. Pt denies N/V, B&B changes, unexplained weight fluctuation, saddle paresthesia, fever, night sweats, or unrelenting night pain at this time.   PAIN:  Are you having pain? Yes: NPRS scale: 1/10 Pain location: L knee Pain description: dull, achy Aggravating factors: moving the knee at all, standing Relieving factors: ice machine, and pain medication.   PRECAUTIONS: None  WEIGHT BEARING RESTRICTIONS: No  FALLS:  Has patient fallen in last 6 months? Yes. Number of falls 6 with L knee giving out on hiking trail  LIVING ENVIRONMENT: Lives with: lives with their spouse Lives in: House/apartment Stairs: No Has following equipment at home: Single point cane; RW  OCCUPATION: Retired  PLOF: Independent  PATIENT GOALS: Get back to hiking and biking  NEXT MD VISIT: 07/26/22  OBJECTIVE:   DIAGNOSTIC FINDINGS: none  PATIENT SURVEYS:  FOTO 49; goal 26  COGNITION: Overall cognitive status: Within functional limits for tasks assessed     SENSATION: WFL  EDEMA:  Circumferential: mid patella L43cm R36cm   Muscle length WNL bilat  POSTURE: weight shift right R knee hyperext, "hanging on Y ligament: posture  PALPATION: TTP grossly at L knee; minimal patellar motion all directions  LOWER EXTREMITY ROM:  Active /PROMROM Right eval Left eval  Hip flexion All bilat hip mobility WNL  Hip extension   Hip abduction   Hip adduction   Hip internal rotation   Hip external rotation   Knee flexion 150 94/101  Knee extension -5 4/0  Ankle dorsiflexion    Ankle plantarflexion    Ankle inversion    Ankle eversion     (Blank rows = not tested)  LOWER EXTREMITY MMT:  MMT Right eval Left eval  Hip flexion 4+ 4  Hip extension 4+ 4+  Hip abduction 4+ 4-  Hip adduction    Hip internal rotation 5   Hip external rotation 4+   Knee flexion 5   Knee extension 5   Ankle  dorsiflexion 5 5  Ankle plantarflexion    Ankle inversion    Ankle eversion     (Blank rows = not tested)  1RM RLE knee ext 25# knee flex 10# but limited by LLE pain- would be good to retest   FUNCTIONAL TESTS:  5 times sit to stand: 15sec 10 meter walk test: without AD: self selected: 0.48m/s; fastest 0.50m/s  SLS RLE: 62sec LLE: 18sec  GAIT: Distance walked: 30M Assistive device utilized:  walking stick Level of assistance: SBA Comments: L knee hyperext with stance phase, decreased R step length   TODAY'S TREATMENT: DATE: 08/07/22  TherEx:  Recumbent bike seat 4, 6 min  L5-8 resistance TRX squats 2x10 TRX  reverse lunge leading with each LE  x10 Leg Press at Omega 65# 2x10, cues for slow controlled movement  Leg Press at Omega 25#, 35# on L LE, 55#, 65# on R LE, x10 each weight Lateral step down on 6 inch step x 10 each LE  Step ups on 6 inch step x 10 leading with each LE Eccentric step downs 6 inch step, x10 each LE leading Seated leg extensions, 15#, 20#, x10 each weight Wall squats, 3 total attempts, 12 sec, 18 sec, 14 sec    PATIENT EDUCATION:  Education details: Patient was educated on diagnosis, anatomy and pathology involved, prognosis, role of PT, and was given an HEP, demonstrating exercise with proper form following verbal and tactile cues, and was given a paper hand out to continue exercise at home. Pt was educated on and agreed to plan of care.  Person educated: Patient Education method: Explanation, Demonstration, Verbal cues, and Handouts Education comprehension: verbalized understanding, returned demonstration, and verbal cues required  HOME EXERCISE PROGRAM: [id number]  ASSESSMENT:  CLINICAL IMPRESSION:  Pt continues to put forth great effort throughout the session and is able to recruit quads better throughout the exercises, notably with wall squats.  Pt did not require quad stimulation at this time, however may benefit from performing with larger  pads/greater maximal contraction of the quads.   Pt will continue to benefit from skilled therapy to address remaining deficits in order to improve overall QoL and return to PLOF.        OBJECTIVE IMPAIRMENTS: Abnormal gait, decreased activity tolerance, decreased balance, decreased coordination, decreased endurance, decreased knowledge of use of DME, decreased mobility, difficulty walking, decreased ROM, decreased strength, increased edema, impaired flexibility, improper body mechanics, postural dysfunction, and pain.   ACTIVITY LIMITATIONS: carrying, lifting, bending, sitting, standing, squatting, stairs, transfers, bed mobility, bathing, and toileting  PARTICIPATION LIMITATIONS: meal prep, cleaning, laundry, driving, shopping, community activity, and yard work  PERSONAL FACTORS: Age, Fitness, Past/current experiences, Sex, and 1-2 comorbidities: L knee ACL repair, chronic L knee pain   are also affecting patient's functional outcome.   REHAB POTENTIAL: Good  CLINICAL DECISION MAKING: Evolving/moderate complexity  EVALUATION COMPLEXITY: Moderate   GOALS: Goals reviewed with patient? Yes  SHORT TERM GOALS: Target date: 07/28/22 Pt will be independent with HEP in order to improve strength and balance in order to decrease fall risk and improve function at home and work.  Baseline:HEP given  Goal status: INITIAL   LONG TERM GOALS: Target date: 09/06/22  Patient will increase FOTO score to 69 to demonstrate predicted increase in functional mobility to complete ADLs Baseline: 49 Goal status: INITIAL  2.  Pt will increase to at least 1.37m/s in order to demonstrate clinically significant independence in community ambulation and PLOF  Baseline: 5 times sit to stand: 15sec 10 meter walk test: without AD: self selected: 0.98m/s; fastest 0.69m/s  Goal status: INITIAL  3.  Pt will demonstrate 5xSTS time of 12 sec or less in order to demonstrate age matched and clinically  significant increase in LE strength Baseline: 15sec Goal status: INITIAL  4.  Pt will demonstrate L knee ROM of 0-120d in order to be able to complete mountain biking (PLOF)  Baseline: 4-94d Goal status: INITIAL  5.  Pt will be able to balance on LLE for at least 51 sec in order to demonstrate 90% of static balance of unaffected side Baseline: 56sec Goal status: INITIAL   PLAN:  PT FREQUENCY: 1-2x/week  PT DURATION: 8 weeks  PLANNED INTERVENTIONS: Therapeutic exercises, Therapeutic activity, Neuromuscular re-education, Balance training, Gait training, Patient/Family education, Self Care, Joint mobilization, Joint manipulation, Stair training, DME instructions, Dry Needling, Electrical stimulation, Spinal manipulation, Spinal mobilization, Cryotherapy, Moist heat, and Traction  PLAN FOR NEXT SESSION:   Sciatic nerve stretches, dynamic balance/strengthening, [place] e-stim if larger pads are available.    Nolon Bussing, PT, DPT Physical Therapist - [facility]  08/07/22, 4:33 PM

## 2022-08-08 MED ORDER — GABAPENTIN 100 MG PO CAPS: 200.0000 mg | ORAL_CAPSULE | Freq: Two times a day (BID) | ORAL | 0 refills | Status: DC

## 2022-08-10 ENCOUNTER — Ambulatory Visit: Payer: PPO | Attending: Orthopaedic Surgery

## 2022-08-10 DIAGNOSIS — G8929 Other chronic pain: Secondary | ICD-10-CM

## 2022-08-10 DIAGNOSIS — M25562 Pain in left knee: Secondary | ICD-10-CM

## 2022-08-10 DIAGNOSIS — M5442 Lumbago with sciatica, left side: Secondary | ICD-10-CM | POA: Insufficient documentation

## 2022-08-10 NOTE — Therapy (Signed)
OUTPATIENT PHYSICAL THERAPY LOWER EXTREMITY TREATMENT   Patient Name: Kristen Stephenson MRN: [id number] DOB:[birth year], 72 y.o., female Today's Date: 08/10/2022  END OF SESSION:   PT End of Session - 08/10/22 0957     Visit Number 14    Number of Visits 17    Date for PT Re-Evaluation 09/07/22    Authorization - Visit Number 14    Authorization - Number of Visits 10    Progress Note Due on Visit 20    PT Start Time 0958    PT Stop Time 1045    PT Time Calculation (min) 47 min    Equipment Utilized During Treatment Gait belt    Activity Tolerance Patient limited by fatigue;Treatment limited secondary to medical complications (Comment)    Behavior During Therapy WFL for tasks assessed/performed              Past Medical History:  Diagnosis Date   Adjustment disorder with anxiety    Anxiety    Arthritis    knee,left   Barrett's esophagus    [facility] Provider Based Lic Grp-Needs Endoscopy in 2023   Cancer (HCC)    cervix   Cataract    beginning   GERD (gastroesophageal reflux disease)    H/O ovarian cancer    Heart murmur    History of malignant neoplasm of appendix    Hyperlipidemia    Hyperlipidemia    Lung nodule    Osteopenia    Osteopenia after menopause 09/01/2019   dexa 08/2019   Ovarian cancer (HCC) 2018   Subclinical hypothyroidism 06/15/2019   Past Surgical History:  Procedure Laterality Date   ABDOMINAL HYSTERECTOMY     ANKLE SURGERY  2003   APPENDECTOMY  2018   Cancer/S/P resection and chemo   ARTHROSCOPIC REPAIR ACL Left    BREAST EXCISIONAL BIOPSY     CYST EXCISION Right 1972   Right breast/benign   FEMUR FRACTURE SURGERY Right    Plate with 5 screws   INGUINAL HERNIA REPAIR  11/2007   TONSILLECTOMY     TOTAL KNEE ARTHROPLASTY Left 06/12/2022   Procedure: LEFT TOTAL KNEE ARTHROPLASTY;  Surgeon: Kathryne Hitch, MD;  Location: [facility] OR;  Service: Orthopedics;  Laterality: Left;   VULVAR LESION REMOVAL  07/2007   benign   Patient Active  Problem List   Diagnosis Date Noted   Status post total left knee replacement 06/12/2022   Vitamin B12 deficiency 05/14/2022   Long-term current use of proton pump inhibitor therapy 05/03/2022   Lumbar and sacral spondyloarthritis 05/03/2022   Barrett's esophagus without dysplasia 04/19/2021   Postcoital UTI - recurrent 02/03/2020   Pulmonary nodules 09/23/2019   Presbycusis of both ears 09/23/2019   Osteopenia after menopause 09/01/2019   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   Anxiety 09/16/2018   History of ovarian cancer 09/16/2018   Mixed hyperlipidemia 09/16/2018   GERD (gastroesophageal reflux disease) 09/16/2018    PCP: Durward Mallard MD  REFERRING PROVIDER: Magnus Ivan MD  REFERRING DIAG: L TKA  THERAPY DIAG:  Acute pain of left knee  Chronic left-sided low back pain with left-sided sciatica  Rationale for Evaluation and Treatment: Rehabilitation  ONSET DATE: 06/12/22  SUBJECTIVE:   SUBJECTIVE STATEMENT:  Pt reports she had a little bit of increased pain in the knee over the night as she has stopped taking her pain medicine and started only taking acetaminophen.      PERTINENT HISTORY:  Pt is a 72 year old female presenting s/p  L TKE 06/12/22. Pt reports ACL repair 10years ago, that "failed" causing her so seek TKA. Prior to TKA was having trouble completing hiking with her husband without falling from "my knee would give out". Pt had HHPT for a 4days post op. Reports completing heel slides, prop stretch, standing hip abd and ext, standing on 1 leg, and attempting LAQ. Pt is taking pain medication as prescribed with tylenol as needed. Current and best pain 1/10; worst 6/10. Pt is completing bathing and dressing modi; has walk in shower with grab bars. Husband is completing cooking and cleaning. Husband drives currently as well. Pt is using a walking stick currently and RW  in community- PLOF ind without AD. Pt is retired, enjoys hiking 3-39miles and road biking  5-106miles 3x/week with her husband. Pt has a trip to [place] in September planned in 5days. Pt denies N/V, B&B changes, unexplained weight fluctuation, saddle paresthesia, fever, night sweats, or unrelenting night pain at this time.   PAIN:  Are you having pain? Yes: NPRS scale: 1/10 Pain location: L knee Pain description: dull, achy Aggravating factors: moving the knee at all, standing Relieving factors: ice machine, and pain medication.   PRECAUTIONS: None  WEIGHT BEARING RESTRICTIONS: No  FALLS:  Has patient fallen in last 6 months? Yes. Number of falls 6 with L knee giving out on hiking trail  LIVING ENVIRONMENT: Lives with: lives with their spouse Lives in: House/apartment Stairs: No Has following equipment at home: Single point cane; RW  OCCUPATION: Retired  PLOF: Independent  PATIENT GOALS: Get back to hiking and biking  NEXT MD VISIT: 07/26/22  OBJECTIVE:   DIAGNOSTIC FINDINGS: none  PATIENT SURVEYS:  FOTO 49; goal 48  COGNITION: Overall cognitive status: Within functional limits for tasks assessed     SENSATION: WFL  EDEMA:  Circumferential: mid patella L43cm R36cm   Muscle length WNL bilat  POSTURE: weight shift right R knee hyperext, "hanging on Y ligament: posture  PALPATION: TTP grossly at L knee; minimal patellar motion all directions  LOWER EXTREMITY ROM:  Active /PROMROM Right eval Left eval  Hip flexion All bilat hip mobility WNL  Hip extension   Hip abduction   Hip adduction   Hip internal rotation   Hip external rotation   Knee flexion 150 94/101  Knee extension -5 4/0  Ankle dorsiflexion    Ankle plantarflexion    Ankle inversion    Ankle eversion     (Blank rows = not tested)  LOWER EXTREMITY MMT:  MMT Right eval Left eval  Hip flexion 4+ 4  Hip extension 4+ 4+  Hip abduction 4+ 4-  Hip adduction    Hip internal rotation 5   Hip external rotation 4+   Knee flexion 5   Knee extension 5   Ankle  dorsiflexion 5 5  Ankle plantarflexion    Ankle inversion    Ankle eversion     (Blank rows = not tested)  1RM RLE knee ext 25# knee flex 10# but limited by LLE pain- would be good to retest   FUNCTIONAL TESTS:  5 times sit to stand: 15sec 10 meter walk test: without AD: self selected: 0.34m/s; fastest 0.21m/s  SLS RLE: 62sec LLE: 18sec  GAIT: Distance walked: 49M Assistive device utilized:  walking stick Level of assistance: SBA Comments: L knee hyperext with stance phase, decreased R step length   TODAY'S TREATMENT: DATE: 08/10/22  TherEx:  Recumbent bike seat 3, 6 min  L5-8 resistance TRX squats 2x15  TRX lunge with Ue support and leading with each LE,  x15 each LE leading Leg Press at Omega 65# 2x15, cues for slow controlled movement  Leg Press at Omega, 35# on L LE,  65# on R LE, x10 each weight Leg press at Omega 95#, x10 for maximal contraction of both LE's   Manual  STM with TP release technique applied to the gluteal region with focus placed on piriformis for pain modulation and reduction in overall pain levels experienced by pt.   Trigger Point Dry Needling (TDN), unbilled, 6 minutes  Education performed with patient regarding potential benefit of TDN. Reviewed precautions and risks with patient. Reviewed special precautions/risks over lung fields which include pneumothorax. Reviewed signs and symptoms of pneumothorax and advised pt to go to ER immediately if these symptoms develop advise them of dry needling treatment. Extensive time spent with pt to ensure full understanding of TDN risks. Pt provided verbal consent to treatment. TDN performed to L piriformis with 0.30 x 75 single needle placements with local twitch response (LTR). Pistoning technique utilized. Improved pain-free motion following intervention.     PATIENT EDUCATION:  Education details: Patient was educated on diagnosis, anatomy and pathology involved, prognosis, role of PT, and was given an  HEP, demonstrating exercise with proper form following verbal and tactile cues, and was given a paper hand out to continue exercise at home. Pt was educated on and agreed to plan of care.  Person educated: Patient Education method: Explanation, Demonstration, Verbal cues, and Handouts Education comprehension: verbalized understanding, returned demonstration, and verbal cues required  HOME EXERCISE PROGRAM: [id number]  ASSESSMENT:  CLINICAL IMPRESSION:  Pt performed well with the exercises however was more limited by pain in the piriformis when performing exercises.  Pt unable to complete full set of leg press due to the pain in the piriformis, therefore dry needling was performed in order to relieve the pain.  Pt noted a reduction in the painful symptoms.  Pt advised to drink water and expect soreness over the next 24-48 hours.   Pt will continue to benefit from skilled therapy to address remaining deficits in order to improve overall QoL and return to PLOF.         OBJECTIVE IMPAIRMENTS: Abnormal gait, decreased activity tolerance, decreased balance, decreased coordination, decreased endurance, decreased knowledge of use of DME, decreased mobility, difficulty walking, decreased ROM, decreased strength, increased edema, impaired flexibility, improper body mechanics, postural dysfunction, and pain.   ACTIVITY LIMITATIONS: carrying, lifting, bending, sitting, standing, squatting, stairs, transfers, bed mobility, bathing, and toileting  PARTICIPATION LIMITATIONS: meal prep, cleaning, laundry, driving, shopping, community activity, and yard work  PERSONAL FACTORS: Age, Fitness, Past/current experiences, Sex, and 1-2 comorbidities: L knee ACL repair, chronic L knee pain   are also affecting patient's functional outcome.   REHAB POTENTIAL: Good  CLINICAL DECISION MAKING: Evolving/moderate complexity  EVALUATION COMPLEXITY: Moderate   GOALS: Goals reviewed with patient? Yes  SHORT TERM  GOALS: Target date: 07/28/22 Pt will be independent with HEP in order to improve strength and balance in order to decrease fall risk and improve function at home and work.  Baseline:HEP given  Goal status: INITIAL   LONG TERM GOALS: Target date: 09/06/22  Patient will increase FOTO score to 69 to demonstrate predicted increase in functional mobility to complete ADLs Baseline: 49 Goal status: INITIAL  2.  Pt will increase to at least 1.4m/s in order to demonstrate clinically significant independence in community ambulation and PLOF  Baseline: 5 times  sit to stand: 15sec 10 meter walk test: without AD: self selected: 0.39m/s; fastest 0.10m/s  Goal status: INITIAL  3.  Pt will demonstrate 5xSTS time of 12 sec or less in order to demonstrate age matched and clinically significant increase in LE strength Baseline: 15sec Goal status: INITIAL  4.  Pt will demonstrate L knee ROM of 0-120d in order to be able to complete mountain biking (PLOF)  Baseline: 4-94d Goal status: INITIAL  5.  Pt will be able to balance on LLE for at least 51 sec in order to demonstrate 90% of static balance of unaffected side Baseline: 56sec Goal status: INITIAL   PLAN:  PT FREQUENCY: 1-2x/week  PT DURATION: 8 weeks  PLANNED INTERVENTIONS: Therapeutic exercises, Therapeutic activity, Neuromuscular re-education, Balance training, Gait training, Patient/Family education, Self Care, Joint mobilization, Joint manipulation, Stair training, DME instructions, Dry Needling, Electrical stimulation, Spinal manipulation, Spinal mobilization, Cryotherapy, Moist heat, and Traction  PLAN FOR NEXT SESSION:   Sciatic nerve stretches, dynamic balance/strengthening, russian e-stim if larger pads are available.    Nolon Bussing, PT, DPT Physical Therapist - [facility]  08/10/22, 12:18 PM

## 2022-08-13 ENCOUNTER — Ambulatory Visit: Payer: PPO

## 2022-08-13 DIAGNOSIS — M25562 Pain in left knee: Secondary | ICD-10-CM | POA: Diagnosis not present

## 2022-08-13 DIAGNOSIS — G8929 Other chronic pain: Secondary | ICD-10-CM

## 2022-08-13 NOTE — Therapy (Signed)
OUTPATIENT PHYSICAL THERAPY LOWER EXTREMITY TREATMENT   Patient Name: Kristen Stephenson MRN: [id number] DOB:Jun 17, 1950, 72 y.o., female Today's Date: 08/13/2022  END OF SESSION:   PT End of Session - 08/13/22 1017     Visit Number 15    Number of Visits 17    Date for PT Re-Evaluation 09/07/22    Authorization - Visit Number 15    Authorization - Number of Visits 10    Progress Note Due on Visit 20    PT Start Time 1015    PT Stop Time 1108    PT Time Calculation (min) 53 min    Equipment Utilized During Treatment Gait belt    Activity Tolerance Patient limited by fatigue;Treatment limited secondary to medical complications (Comment)    Behavior During Therapy WFL for tasks assessed/performed               Past Medical History:  Diagnosis Date   Adjustment disorder with anxiety    Anxiety    Arthritis    knee,left   Barrett's esophagus    [facility] Provider Based Lic Grp-Needs Endoscopy in 2023   Cancer (HCC)    cervix   Cataract    beginning   GERD (gastroesophageal reflux disease)    H/O ovarian cancer    Heart murmur    History of malignant neoplasm of appendix    Hyperlipidemia    Hyperlipidemia    Lung nodule    Osteopenia    Osteopenia after menopause 09/01/2019   dexa 08/2019   Ovarian cancer (HCC) 2018   Subclinical hypothyroidism 06/15/2019   Past Surgical History:  Procedure Laterality Date   ABDOMINAL HYSTERECTOMY     ANKLE SURGERY  2003   APPENDECTOMY  2018   Cancer/S/P resection and chemo   ARTHROSCOPIC REPAIR ACL Left    BREAST EXCISIONAL BIOPSY     CYST EXCISION Right 1972   Right breast/benign   FEMUR FRACTURE SURGERY Right    Plate with 5 screws   INGUINAL HERNIA REPAIR  11/2007   TONSILLECTOMY     TOTAL KNEE ARTHROPLASTY Left 06/12/2022   Procedure: LEFT TOTAL KNEE ARTHROPLASTY;  Surgeon: Kathryne Hitch, MD;  Location: [facility] OR;  Service: Orthopedics;  Laterality: Left;   VULVAR LESION REMOVAL  07/2007   benign   Patient Active  Problem List   Diagnosis Date Noted   Status post total left knee replacement 06/12/2022   Vitamin B12 deficiency 05/14/2022   Long-term current use of proton pump inhibitor therapy 05/03/2022   Lumbar and sacral spondyloarthritis 05/03/2022   Barrett's esophagus without dysplasia 04/19/2021   Postcoital UTI - recurrent 02/03/2020   Pulmonary nodules 09/23/2019   Presbycusis of both ears 09/23/2019   Osteopenia after menopause 09/01/2019   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   Anxiety 09/16/2018   History of ovarian cancer 09/16/2018   Mixed hyperlipidemia 09/16/2018   GERD (gastroesophageal reflux disease) 09/16/2018    PCP: Durward Mallard MD  REFERRING PROVIDER: Magnus Ivan MD  REFERRING DIAG: L TKA  THERAPY DIAG:  Acute pain of left knee  Chronic left-sided low back pain with left-sided sciatica  Rationale for Evaluation and Treatment: Rehabilitation  ONSET DATE: 06/12/22  SUBJECTIVE:   SUBJECTIVE STATEMENT:  Pt reports she had an uneventful weekend due to the rain.  Pt notes she is experiencing a little bit of back pain upon arrival to the clinic.      PERTINENT HISTORY:  Pt is a 72 year old female presenting s/p  L TKE 06/12/22. Pt reports ACL repair 10years ago, that "failed" causing her so seek TKA. Prior to TKA was having trouble completing hiking with her husband without falling from "my knee would give out". Pt had HHPT for a 4days post op. Reports completing heel slides, prop stretch, standing hip abd and ext, standing on 1 leg, and attempting LAQ. Pt is taking pain medication as prescribed with tylenol as needed. Current and best pain 1/10; worst 6/10. Pt is completing bathing and dressing modi; has walk in shower with grab bars. Husband is completing cooking and cleaning. Husband drives currently as well. Pt is using a walking stick currently and RW  in community- PLOF ind without AD. Pt is retired, enjoys hiking 3-1miles and road biking 5-11miles 3x/week with  her husband. Pt has a trip to [place] in September planned in 5days. Pt denies N/V, B&B changes, unexplained weight fluctuation, saddle paresthesia, fever, night sweats, or unrelenting night pain at this time.   PAIN:  Are you having pain? Yes: NPRS scale: 5/10 Pain location: LBP  PRECAUTIONS: None  WEIGHT BEARING RESTRICTIONS: No  FALLS:  Has patient fallen in last 6 months? Yes. Number of falls 6 with L knee giving out on hiking trail  LIVING ENVIRONMENT: Lives with: lives with their spouse Lives in: House/apartment Stairs: No Has following equipment at home: Single point cane; RW  OCCUPATION: Retired  PLOF: Independent  PATIENT GOALS: Get back to hiking and biking  NEXT MD VISIT: 07/26/22  OBJECTIVE:   DIAGNOSTIC FINDINGS: none  PATIENT SURVEYS:  FOTO 49; goal 26  COGNITION: Overall cognitive status: Within functional limits for tasks assessed     SENSATION: WFL  EDEMA:  Circumferential: mid patella L43cm R36cm   Muscle length WNL bilat  POSTURE: weight shift right R knee hyperext, "hanging on Y ligament: posture  PALPATION: TTP grossly at L knee; minimal patellar motion all directions  LOWER EXTREMITY ROM:  Active /PROMROM Right eval Left eval  Hip flexion All bilat hip mobility WNL  Hip extension   Hip abduction   Hip adduction   Hip internal rotation   Hip external rotation   Knee flexion 150 94/101  Knee extension -5 4/0  Ankle dorsiflexion    Ankle plantarflexion    Ankle inversion    Ankle eversion     (Blank rows = not tested)  LOWER EXTREMITY MMT:  MMT Right eval Left eval  Hip flexion 4+ 4  Hip extension 4+ 4+  Hip abduction 4+ 4-  Hip adduction    Hip internal rotation 5   Hip external rotation 4+   Knee flexion 5   Knee extension 5   Ankle dorsiflexion 5 5  Ankle plantarflexion    Ankle inversion    Ankle eversion     (Blank rows = not tested)  1RM RLE knee ext 25# knee flex 10# but limited by LLE pain-  would be good to retest   FUNCTIONAL TESTS:  5 times sit to stand: 15sec 10 meter walk test: without AD: self selected: 0.81m/s; fastest 0.79m/s  SLS RLE: 62sec LLE: 18sec  GAIT: Distance walked: 33M Assistive device utilized:  walking stick Level of assistance: SBA Comments: L knee hyperext with stance phase, decreased R step length   TODAY'S TREATMENT: DATE: 08/13/22   TherEx:  Recumbent bike seat 3, 6 min  L5-8 resistance Seated blue physioball roll-out for lumbar stretch, straight out and lateral, 2x10   Manual  Prone STM with TP release technique applied  to the gluteal region with focus placed on piriformis for pain modulation and reduction in overall pain levels experienced by pt. Prone STM with TP release technique applied to the lumbar paraspinals for increased mobility and tissue extensibility Prone Unilateral/PA's to lumbar spine, Grades II-III for pain tolerance and improved mobility of the spine, 30 sec bouts at each segment.   Trigger Point Dry Needling (TDN), unbilled, 6 minutes  Education performed with patient regarding potential benefit of TDN. Reviewed precautions and risks with patient. Reviewed special precautions/risks over lung fields which include pneumothorax. Reviewed signs and symptoms of pneumothorax and advised pt to go to ER immediately if these symptoms develop advise them of dry needling treatment. Extensive time spent with pt to ensure full understanding of TDN risks. Pt provided verbal consent to treatment. TDN performed to L piriformis with 0.30 x 75 single needle placements with local twitch response (LTR). TDN performed to bilateral lumbar paraspinals with 0.30 x 60 single needle placements with local twitch response (LTR). Pistoning technique utilized. Improved pain-free motion following intervention.     PATIENT EDUCATION:  Education details: Patient was educated on diagnosis, anatomy and pathology involved, prognosis, role of PT, and was  given an HEP, demonstrating exercise with proper form following verbal and tactile cues, and was given a paper hand out to continue exercise at home. Pt was educated on and agreed to plan of care.  Person educated: Patient Education method: Explanation, Demonstration, Verbal cues, and Handouts Education comprehension: verbalized understanding, returned demonstration, and verbal cues required  HOME EXERCISE PROGRAM: [id number]  ASSESSMENT:  CLINICAL IMPRESSION:  Pt responded well to the dry needling and manual therapy approaches.  Pt elected to not participate in as much strength training as she believes it to have been the reasoning for her back/piriformis having an increase in pain tolerable levels.  Therapist to continue to monitor response to dry needling and manual approaches in order to improve overall ROM, strength, and pain levels.   Pt will continue to benefit from skilled therapy to address remaining deficits in order to improve overall QoL and return to PLOF.       OBJECTIVE IMPAIRMENTS: Abnormal gait, decreased activity tolerance, decreased balance, decreased coordination, decreased endurance, decreased knowledge of use of DME, decreased mobility, difficulty walking, decreased ROM, decreased strength, increased edema, impaired flexibility, improper body mechanics, postural dysfunction, and pain.   ACTIVITY LIMITATIONS: carrying, lifting, bending, sitting, standing, squatting, stairs, transfers, bed mobility, bathing, and toileting  PARTICIPATION LIMITATIONS: meal prep, cleaning, laundry, driving, shopping, community activity, and yard work  PERSONAL FACTORS: Age, Fitness, Past/current experiences, Sex, and 1-2 comorbidities: L knee ACL repair, chronic L knee pain   are also affecting patient's functional outcome.   REHAB POTENTIAL: Good  CLINICAL DECISION MAKING: Evolving/moderate complexity  EVALUATION COMPLEXITY: Moderate   GOALS: Goals reviewed with patient? Yes  SHORT  TERM GOALS: Target date: 07/28/22 Pt will be independent with HEP in order to improve strength and balance in order to decrease fall risk and improve function at home and work.  Baseline:HEP given  Goal status: INITIAL   LONG TERM GOALS: Target date: 09/06/22  Patient will increase FOTO score to 69 to demonstrate predicted increase in functional mobility to complete ADLs Baseline: 49 Goal status: INITIAL  2.  Pt will increase to at least 1.6m/s in order to demonstrate clinically significant independence in community ambulation and PLOF  Baseline: 5 times sit to stand: 15sec 10 meter walk test: without AD: self selected: 0.32m/s; fastest 0.24m/s  Goal status: INITIAL  3.  Pt will demonstrate 5xSTS time of 12 sec or less in order to demonstrate age matched and clinically significant increase in LE strength Baseline: 15sec Goal status: INITIAL  4.  Pt will demonstrate L knee ROM of 0-120d in order to be able to complete mountain biking (PLOF)  Baseline: 4-94d Goal status: INITIAL  5.  Pt will be able to balance on LLE for at least 51 sec in order to demonstrate 90% of static balance of unaffected side Baseline: 56sec Goal status: INITIAL   PLAN:  PT FREQUENCY: 1-2x/week  PT DURATION: 8 weeks  PLANNED INTERVENTIONS: Therapeutic exercises, Therapeutic activity, Neuromuscular re-education, Balance training, Gait training, Patient/Family education, Self Care, Joint mobilization, Joint manipulation, Stair training, DME instructions, Dry Needling, Electrical stimulation, Spinal manipulation, Spinal mobilization, Cryotherapy, Moist heat, and Traction  PLAN FOR NEXT SESSION:   Sciatic nerve stretches, dynamic balance/strengthening, russian e-stim if larger pads are available.  Dry needling assessment.    Nolon Bussing, PT, DPT Physical Therapist - [facility]  08/13/22, 11:15 AM

## 2022-08-15 ENCOUNTER — Ambulatory Visit: Payer: PPO

## 2022-08-15 DIAGNOSIS — M25562 Pain in left knee: Secondary | ICD-10-CM | POA: Diagnosis not present

## 2022-08-15 DIAGNOSIS — G8929 Other chronic pain: Secondary | ICD-10-CM

## 2022-08-15 NOTE — Therapy (Signed)
OUTPATIENT PHYSICAL THERAPY LOWER EXTREMITY TREATMENT   Patient Name: Kristen Stephenson MRN: [id number] DOB:Feb 01, 1951, 72 y.o., female Today's Date: 08/15/2022  END OF SESSION:   PT End of Session - 08/15/22 1440     Visit Number 16    Number of Visits 17    Date for PT Re-Evaluation 09/07/22    Authorization - Visit Number 16    Authorization - Number of Visits 10    Progress Note Due on Visit 20    PT Start Time 1436    PT Stop Time 1515    PT Time Calculation (min) 39 min    Equipment Utilized During Treatment Gait belt    Activity Tolerance Patient limited by fatigue;Treatment limited secondary to medical complications (Comment)    Behavior During Therapy WFL for tasks assessed/performed                Past Medical History:  Diagnosis Date   Adjustment disorder with anxiety    Anxiety    Arthritis    knee,left   Barrett's esophagus    [facility] Provider Based Lic Grp-Needs Endoscopy in 2023   Cancer (HCC)    cervix   Cataract    beginning   GERD (gastroesophageal reflux disease)    H/O ovarian cancer    Heart murmur    History of malignant neoplasm of appendix    Hyperlipidemia    Hyperlipidemia    Lung nodule    Osteopenia    Osteopenia after menopause 09/01/2019   dexa 08/2019   Ovarian cancer (HCC) 2018   Subclinical hypothyroidism 06/15/2019   Past Surgical History:  Procedure Laterality Date   ABDOMINAL HYSTERECTOMY     ANKLE SURGERY  2003   APPENDECTOMY  2018   Cancer/S/P resection and chemo   ARTHROSCOPIC REPAIR ACL Left    BREAST EXCISIONAL BIOPSY     CYST EXCISION Right 1972   Right breast/benign   FEMUR FRACTURE SURGERY Right    Plate with 5 screws   INGUINAL HERNIA REPAIR  11/2007   TONSILLECTOMY     TOTAL KNEE ARTHROPLASTY Left 06/12/2022   Procedure: LEFT TOTAL KNEE ARTHROPLASTY;  Surgeon: Kathryne Hitch, MD;  Location: [facility] OR;  Service: Orthopedics;  Laterality: Left;   VULVAR LESION REMOVAL  07/2007   benign   Patient  Active Problem List   Diagnosis Date Noted   Status post total left knee replacement 06/12/2022   Vitamin B12 deficiency 05/14/2022   Long-term current use of proton pump inhibitor therapy 05/03/2022   Lumbar and sacral spondyloarthritis 05/03/2022   Barrett's esophagus without dysplasia 04/19/2021   Postcoital UTI - recurrent 02/03/2020   Pulmonary nodules 09/23/2019   Presbycusis of both ears 09/23/2019   Osteopenia after menopause 09/01/2019   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   Anxiety 09/16/2018   History of ovarian cancer 09/16/2018   Mixed hyperlipidemia 09/16/2018   GERD (gastroesophageal reflux disease) 09/16/2018    PCP: Durward Mallard MD  REFERRING PROVIDER: Magnus Ivan MD  REFERRING DIAG: L TKA  THERAPY DIAG:  Acute pain of left knee  Chronic left-sided low back pain with left-sided sciatica  Rationale for Evaluation and Treatment: Rehabilitation  ONSET DATE: 06/12/22  SUBJECTIVE:   SUBJECTIVE STATEMENT:  Pt reports that she is doing well and responded well to the manual therapy from last time.  She noted that she had decreased pain that night, however Tuesday morning she was experiencing pain in the piriformis again.    PERTINENT HISTORY:  Pt is a 72 year old female presenting s/p L TKE 06/12/22. Pt reports ACL repair 10years ago, that "failed" causing her so seek TKA. Prior to TKA was having trouble completing hiking with her husband without falling from "my knee would give out". Pt had HHPT for a 4days post op. Reports completing heel slides, prop stretch, standing hip abd and ext, standing on 1 leg, and attempting LAQ. Pt is taking pain medication as prescribed with tylenol as needed. Current and best pain 1/10; worst 6/10. Pt is completing bathing and dressing modi; has walk in shower with grab bars. Husband is completing cooking and cleaning. Husband drives currently as well. Pt is using a walking stick currently and RW  in community- PLOF ind without  AD. Pt is retired, enjoys hiking 3-55miles and road biking 5-64miles 3x/week with her husband. Pt has a trip to [place] in September planned in 5days. Pt denies N/V, B&B changes, unexplained weight fluctuation, saddle paresthesia, fever, night sweats, or unrelenting night pain at this time.   PAIN:  Are you having pain? Yes: NPRS scale: 5/10 Pain location: LBP  PRECAUTIONS: None  WEIGHT BEARING RESTRICTIONS: No  FALLS:  Has patient fallen in last 6 months? Yes. Number of falls 6 with L knee giving out on hiking trail  LIVING ENVIRONMENT: Lives with: lives with their spouse Lives in: House/apartment Stairs: No Has following equipment at home: Single point cane; RW  OCCUPATION: Retired  PLOF: Independent  PATIENT GOALS: Get back to hiking and biking  NEXT MD VISIT: 07/26/22  OBJECTIVE:   DIAGNOSTIC FINDINGS: none  PATIENT SURVEYS:  FOTO 49; goal 35  COGNITION: Overall cognitive status: Within functional limits for tasks assessed     SENSATION: WFL  EDEMA:  Circumferential: mid patella L43cm R36cm   Muscle length WNL bilat  POSTURE: weight shift right R knee hyperext, "hanging on Y ligament: posture  PALPATION: TTP grossly at L knee; minimal patellar motion all directions  LOWER EXTREMITY ROM:  Active /PROMROM Right eval Left eval  Hip flexion All bilat hip mobility WNL  Hip extension   Hip abduction   Hip adduction   Hip internal rotation   Hip external rotation   Knee flexion 150 94/101  Knee extension -5 4/0  Ankle dorsiflexion    Ankle plantarflexion    Ankle inversion    Ankle eversion     (Blank rows = not tested)  LOWER EXTREMITY MMT:  MMT Right eval Left eval  Hip flexion 4+ 4  Hip extension 4+ 4+  Hip abduction 4+ 4-  Hip adduction    Hip internal rotation 5   Hip external rotation 4+   Knee flexion 5   Knee extension 5   Ankle dorsiflexion 5 5  Ankle plantarflexion    Ankle inversion    Ankle eversion     (Blank  rows = not tested)  1RM RLE knee ext 25# knee flex 10# but limited by LLE pain- would be good to retest   FUNCTIONAL TESTS:  5 times sit to stand: 15sec 10 meter walk test: without AD: self selected: 0.5m/s; fastest 0.61m/s  SLS RLE: 62sec LLE: 18sec  GAIT: Distance walked: 77M Assistive device utilized:  walking stick Level of assistance: SBA Comments: L knee hyperext with stance phase, decreased R step length   TODAY'S TREATMENT: DATE: 08/15/22   TherEx:  Recumbent bike seat 3, 6 min  L5-8 resistance Hooklying piriformis stretch, 30 sec bouts x4, each LE Hooklying STM with use of  lacrosse ball on the piriformis, 30 sec bouts Hooklying figure 4 stretch, 30 sec bouts x4, each LE Prone hip extension with knee bent, 2x10 each LE  Manual  Prone STM with TP release technique applied to the gluteal region with focus placed on piriformis for pain modulation and reduction in overall pain levels experienced by pt. Prone STM with TP release technique applied to the lumbar paraspinals for increased mobility and tissue extensibility Prone Unilateral/PA's to lumbar spine, Grades II-III for pain tolerance and improved mobility of the spine, 30 sec bouts at each segment.      PATIENT EDUCATION:  Education details: Patient was educated on diagnosis, anatomy and pathology involved, prognosis, role of PT, and was given an HEP, demonstrating exercise with proper form following verbal and tactile cues, and was given a paper hand out to continue exercise at home. Pt was educated on and agreed to plan of care.  Person educated: Patient Education method: Programmer, multimedia, Demonstration, Verbal cues, and Handouts Education comprehension: verbalized understanding, returned demonstration, and verbal cues required  HOME EXERCISE PROGRAM: [id number]  ASSESSMENT:  CLINICAL IMPRESSION:  Pt continues to respond well to the manual therapy approaches and was given updated HEP to target the piriformis  at home.  Pt also instructed on how to perform techniques with a lacrosse ball at home.  Pt encouraged to remain active at home with HEP and continue with the previous exercises given.  Pt still with significant TP's in the piriformis which was addressed by the therapist with manual therapy approach.   Pt will continue to benefit from skilled therapy to address remaining deficits in order to improve overall QoL and return to PLOF.        OBJECTIVE IMPAIRMENTS: Abnormal gait, decreased activity tolerance, decreased balance, decreased coordination, decreased endurance, decreased knowledge of use of DME, decreased mobility, difficulty walking, decreased ROM, decreased strength, increased edema, impaired flexibility, improper body mechanics, postural dysfunction, and pain.   ACTIVITY LIMITATIONS: carrying, lifting, bending, sitting, standing, squatting, stairs, transfers, bed mobility, bathing, and toileting  PARTICIPATION LIMITATIONS: meal prep, cleaning, laundry, driving, shopping, community activity, and yard work  PERSONAL FACTORS: Age, Fitness, Past/current experiences, Sex, and 1-2 comorbidities: L knee ACL repair, chronic L knee pain   are also affecting patient's functional outcome.   REHAB POTENTIAL: Good  CLINICAL DECISION MAKING: Evolving/moderate complexity  EVALUATION COMPLEXITY: Moderate   GOALS: Goals reviewed with patient? Yes  SHORT TERM GOALS: Target date: 07/28/22 Pt will be independent with HEP in order to improve strength and balance in order to decrease fall risk and improve function at home and work.  Baseline:HEP given  Goal status: INITIAL   LONG TERM GOALS: Target date: 09/06/22  Patient will increase FOTO score to 69 to demonstrate predicted increase in functional mobility to complete ADLs Baseline: 49 Goal status: INITIAL  2.  Pt will increase to at least 1.21m/s in order to demonstrate clinically significant independence in community ambulation and  PLOF  Baseline: 5 times sit to stand: 15sec 10 meter walk test: without AD: self selected: 0.86m/s; fastest 0.10m/s  Goal status: INITIAL  3.  Pt will demonstrate 5xSTS time of 12 sec or less in order to demonstrate age matched and clinically significant increase in LE strength Baseline: 15sec Goal status: INITIAL  4.  Pt will demonstrate L knee ROM of 0-120d in order to be able to complete mountain biking (PLOF)  Baseline: 4-94d Goal status: INITIAL  5.  Pt will be able to balance on LLE  for at least 51 sec in order to demonstrate 90% of static balance of unaffected side Baseline: 56sec Goal status: INITIAL   PLAN:  PT FREQUENCY: 1-2x/week  PT DURATION: 8 weeks  PLANNED INTERVENTIONS: Therapeutic exercises, Therapeutic activity, Neuromuscular re-education, Balance training, Gait training, Patient/Family education, Self Care, Joint mobilization, Joint manipulation, Stair training, DME instructions, Dry Needling, Electrical stimulation, Spinal manipulation, Spinal mobilization, Cryotherapy, Moist heat, and Traction  PLAN FOR NEXT SESSION:   Sciatic nerve stretches, dynamic balance/strengthening, russian e-stim if larger pads are available.  Dry needling assessment.    Nolon Bussing, PT, DPT Physical Therapist - [facility]  08/15/22, 2:45 PM

## 2022-08-21 ENCOUNTER — Ambulatory Visit: Payer: PPO

## 2022-08-21 DIAGNOSIS — M25562 Pain in left knee: Secondary | ICD-10-CM | POA: Diagnosis not present

## 2022-08-21 DIAGNOSIS — G8929 Other chronic pain: Secondary | ICD-10-CM

## 2022-08-21 NOTE — Therapy (Signed)
OUTPATIENT PHYSICAL THERAPY LOWER EXTREMITY TREATMENT   Patient Name: Kristen Stephenson MRN: [id number] DOB:23-Mar-1951, 72 y.o., female Today's Date: 08/21/2022  END OF SESSION:   PT End of Session - 08/21/22 1438     Visit Number 17    Number of Visits 17    Date for PT Re-Evaluation 09/07/22    Authorization - Visit Number 17    Authorization - Number of Visits 17    Progress Note Due on Visit 20    PT Start Time 1436    PT Stop Time 1515    PT Time Calculation (min) 39 min    Equipment Utilized During Treatment Gait belt    Activity Tolerance Patient limited by fatigue;Treatment limited secondary to medical complications (Comment)    Behavior During Therapy WFL for tasks assessed/performed                 Past Medical History:  Diagnosis Date   Adjustment disorder with anxiety    Anxiety    Arthritis    knee,left   Barrett's esophagus    [facility] Provider Based Lic Grp-Needs Endoscopy in 2023   Cancer (HCC)    cervix   Cataract    beginning   GERD (gastroesophageal reflux disease)    H/O ovarian cancer    Heart murmur    History of malignant neoplasm of appendix    Hyperlipidemia    Hyperlipidemia    Lung nodule    Osteopenia    Osteopenia after menopause 09/01/2019   dexa 08/2019   Ovarian cancer (HCC) 2018   Subclinical hypothyroidism 06/15/2019   Past Surgical History:  Procedure Laterality Date   ABDOMINAL HYSTERECTOMY     ANKLE SURGERY  2003   APPENDECTOMY  2018   Cancer/S/P resection and chemo   ARTHROSCOPIC REPAIR ACL Left    BREAST EXCISIONAL BIOPSY     CYST EXCISION Right 1972   Right breast/benign   FEMUR FRACTURE SURGERY Right    Plate with 5 screws   INGUINAL HERNIA REPAIR  11/2007   TONSILLECTOMY     TOTAL KNEE ARTHROPLASTY Left 06/12/2022   Procedure: LEFT TOTAL KNEE ARTHROPLASTY;  Surgeon: Kathryne Hitch, MD;  Location: [facility] OR;  Service: Orthopedics;  Laterality: Left;   VULVAR LESION REMOVAL  07/2007   benign   Patient  Active Problem List   Diagnosis Date Noted   Status post total left knee replacement 06/12/2022   Vitamin B12 deficiency 05/14/2022   Long-term current use of proton pump inhibitor therapy 05/03/2022   Lumbar and sacral spondyloarthritis 05/03/2022   Barrett's esophagus without dysplasia 04/19/2021   Postcoital UTI - recurrent 02/03/2020   Pulmonary nodules 09/23/2019   Presbycusis of both ears 09/23/2019   Osteopenia after menopause 09/01/2019   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   Anxiety 09/16/2018   History of ovarian cancer 09/16/2018   Mixed hyperlipidemia 09/16/2018   GERD (gastroesophageal reflux disease) 09/16/2018    PCP: Durward Mallard MD  REFERRING PROVIDER: Magnus Ivan MD  REFERRING DIAG: L TKA  THERAPY DIAG:  Acute pain of left knee  Chronic left-sided low back pain with left-sided sciatica  Rationale for Evaluation and Treatment: Rehabilitation  ONSET DATE: 06/12/22  SUBJECTIVE:   SUBJECTIVE STATEMENT:  Pt reports she is doing well and was able to ambulate along the 2 trails over the past couple of days.  Pt and therapist discussed d/c options at this time.  Pt also notes that she has been able to utilize the  lacrosse ball in order to reduce pain in the piriformis.    PERTINENT HISTORY:  Pt is a 72 year old female presenting s/p L TKE 06/12/22. Pt reports ACL repair 10years ago, that "failed" causing her so seek TKA. Prior to TKA was having trouble completing hiking with her husband without falling from "my knee would give out". Pt had HHPT for a 4days post op. Reports completing heel slides, prop stretch, standing hip abd and ext, standing on 1 leg, and attempting LAQ. Pt is taking pain medication as prescribed with tylenol as needed. Current and best pain 1/10; worst 6/10. Pt is completing bathing and dressing modi; has walk in shower with grab bars. Husband is completing cooking and cleaning. Husband drives currently as well. Pt is using a walking stick  currently and RW  in community- PLOF ind without AD. Pt is retired, enjoys hiking 3-37miles and road biking 5-18miles 3x/week with her husband. Pt has a trip to [place] in September planned in 5days. Pt denies N/V, B&B changes, unexplained weight fluctuation, saddle paresthesia, fever, night sweats, or unrelenting night pain at this time.   PAIN:  Are you having pain? Yes: NPRS scale: 5/10 Pain location: LBP  PRECAUTIONS: None  WEIGHT BEARING RESTRICTIONS: No  FALLS:  Has patient fallen in last 6 months? Yes. Number of falls 6 with L knee giving out on hiking trail  LIVING ENVIRONMENT: Lives with: lives with their spouse Lives in: House/apartment Stairs: No Has following equipment at home: Single point cane; RW  OCCUPATION: Retired  PLOF: Independent  PATIENT GOALS: Get back to hiking and biking  NEXT MD VISIT: 07/26/22  OBJECTIVE:   DIAGNOSTIC FINDINGS: none  PATIENT SURVEYS:  FOTO 49; goal 81  COGNITION: Overall cognitive status: Within functional limits for tasks assessed     SENSATION: WFL  EDEMA:  Circumferential: mid patella L43cm R36cm   Muscle length WNL bilat  POSTURE: weight shift right R knee hyperext, "hanging on Y ligament: posture  PALPATION: TTP grossly at L knee; minimal patellar motion all directions  LOWER EXTREMITY ROM:  Active /PROMROM Right eval Left eval  Hip flexion All bilat hip mobility WNL  Hip extension   Hip abduction   Hip adduction   Hip internal rotation   Hip external rotation   Knee flexion 150 94/101  Knee extension -5 4/0  Ankle dorsiflexion    Ankle plantarflexion    Ankle inversion    Ankle eversion     (Blank rows = not tested)  LOWER EXTREMITY MMT:  MMT Right eval Left eval  Hip flexion 4+ 4  Hip extension 4+ 4+  Hip abduction 4+ 4-  Hip adduction    Hip internal rotation 5   Hip external rotation 4+   Knee flexion 5   Knee extension 5   Ankle dorsiflexion 5 5  Ankle plantarflexion     Ankle inversion    Ankle eversion     (Blank rows = not tested)  1RM RLE knee ext 25# knee flex 10# but limited by LLE pain- would be good to retest   FUNCTIONAL TESTS:  5 times sit to stand: 15sec 10 meter walk test: without AD: self selected: 0.47m/s; fastest 0.2m/s  SLS RLE: 62sec LLE: 18sec  GAIT: Distance walked: 79M Assistive device utilized:  walking stick Level of assistance: SBA Comments: L knee hyperext with stance phase, decreased R step length   TODAY'S TREATMENT: DATE: 08/21/22   TherEx:  Goal assessment performed and noted as below.  PATIENT EDUCATION:  Education details: Patient was educated on diagnosis, anatomy and pathology involved, prognosis, role of PT, and was given an HEP, demonstrating exercise with proper form following verbal and tactile cues, and was given a paper hand out to continue exercise at home. Pt was educated on and agreed to plan of care.  Person educated: Patient Education method: Explanation, Demonstration, Verbal cues, and Handouts Education comprehension: verbalized understanding, returned demonstration, and verbal cues required  HOME EXERCISE PROGRAM: [id number]  ASSESSMENT:  CLINICAL IMPRESSION:  Pt has made significant improvements towards goals at this time.  Pt has been able to achieve all goals at this time and has been able to improve her mobility along with increasing her ROM.  Pt does still have some joint stiffness that will be resolved with current HEP and continuation of active healthy lifestyle.  Pt is d/c at this time and encouraged to reach out to clinic if any concerns, questions, or needs arise.        OBJECTIVE IMPAIRMENTS: Abnormal gait, decreased activity tolerance, decreased balance, decreased coordination, decreased endurance, decreased knowledge of use of DME, decreased mobility, difficulty walking, decreased ROM, decreased strength, increased edema, impaired flexibility, improper body mechanics,  postural dysfunction, and pain.   ACTIVITY LIMITATIONS: carrying, lifting, bending, sitting, standing, squatting, stairs, transfers, bed mobility, bathing, and toileting  PARTICIPATION LIMITATIONS: meal prep, cleaning, laundry, driving, shopping, community activity, and yard work  PERSONAL FACTORS: Age, Fitness, Past/current experiences, Sex, and 1-2 comorbidities: L knee ACL repair, chronic L knee pain   are also affecting patient's functional outcome.   REHAB POTENTIAL: Good  CLINICAL DECISION MAKING: Evolving/moderate complexity  EVALUATION COMPLEXITY: Moderate   GOALS: Goals reviewed with patient? Yes  SHORT TERM GOALS: Target date: 07/28/22 Pt will be independent with HEP in order to improve strength and balance in order to decrease fall risk and improve function at home and work.  Baseline:HEP given  Goal status: INITIAL   LONG TERM GOALS: Target date: 09/06/22  Patient will increase FOTO score to 69 to demonstrate predicted increase in functional mobility to complete ADLs Baseline: 49 08/21/22:  Goal status: INITIAL  2.  Pt will increase to at least 1.13m/s in order to demonstrate clinically significant independence in community ambulation and PLOF Baseline: 5 times sit to stand: 15sec 10 meter walk test: without AD: self selected: 0.90m/s; fastest 0.67m/s  08/21/22: self selected: 1.17 m/s, fastest: 1.52 m/s Goal status: INITIAL  3.  Pt will demonstrate 5xSTS time of 12 sec or less in order to demonstrate age matched and clinically significant increase in LE strength Baseline: 15sec 08/21/22: 10.64 Goal status: INITIAL  4.  Pt will demonstrate L knee ROM of 0-120d in order to be able to complete mountain biking (PLOF)  Baseline: 4-94d 08/21/22: 0-120 deg Goal status: INITIAL  5.  Pt will be able to balance on LLE for at least 51 sec in order to demonstrate 90% of static balance of unaffected side Baseline: 56 sec 08/21/22: 76 sec Goal status:  INITIAL   PLAN:  PT FREQUENCY: 1-2x/week  PT DURATION: 8 weeks  PLANNED INTERVENTIONS: Therapeutic exercises, Therapeutic activity, Neuromuscular re-education, Balance training, Gait training, Patient/Family education, Self Care, Joint mobilization, Joint manipulation, Stair training, DME instructions, Dry Needling, Electrical stimulation, Spinal manipulation, Spinal mobilization, Cryotherapy, Moist heat, and Traction  PLAN FOR NEXT SESSION:   Pt is discharged at this time.    Nolon Bussing, PT, DPT Physical Therapist - [facility]  08/21/22,  6:07 PM

## 2022-08-23 ENCOUNTER — Ambulatory Visit: Payer: PPO

## 2022-08-28 ENCOUNTER — Ambulatory Visit: Payer: PPO

## 2022-10-08 ENCOUNTER — Ambulatory Visit: Payer: PPO | Admitting: Physician Assistant

## 2022-10-08 ENCOUNTER — Other Ambulatory Visit: Payer: Self-pay

## 2022-10-08 ENCOUNTER — Encounter: Payer: Self-pay | Admitting: Physician Assistant

## 2022-10-08 DIAGNOSIS — Z96652 Presence of left artificial knee joint: Secondary | ICD-10-CM | POA: Diagnosis not present

## 2022-10-08 NOTE — Progress Notes (Signed)
HPI: Kristen Stephenson comes in today status post left total knee arthroplasty 06/12/2022.  She states she has to question her gait.  Meaning that she has to think about it when she is walking.  She is finished physical therapy.  She states she feels that the left knee is heavy with some discomfort.  She has had no new injury.  She is walking for exercise.  Review of systems see HPI otherwise negative  Physical exam: General well-developed well-nourished female no acute distress ambulates without any assistive device.  Bilateral knees good range of motion of both knees no instability valgus varus stressing of either knee.  She has well-healed left knee surgical incision.  Tenderness over the left knee quad and pes region.  Tight hamstring on the left nontight on the right.  Radiographs: Left knee 2 views:Knee is well located.  Status post left total knee arthroplasty well-seated components.  No acute fractures acute findings.  Impression: Status post left total knee arthroplasty 06/12/2022.  Plan: She will work on Dance movement psychotherapist as shown.  She will also start riding an exercise bike again.  She will also work on hamstring stretching.  Follow-up with [place] in 2 months to see how she is doing overall no x-rays at that time unless clinically indicated.  Questions were encouraged and answered at length today.

## 2022-10-15 ENCOUNTER — Ambulatory Visit: Payer: PPO | Admitting: Physician Assistant

## 2022-10-23 DIAGNOSIS — M25562 Pain in left knee: Secondary | ICD-10-CM | POA: Diagnosis not present

## 2022-10-24 ENCOUNTER — Ambulatory Visit: Payer: PPO | Admitting: Orthopaedic Surgery

## 2022-10-24 ENCOUNTER — Ambulatory Visit: Payer: PPO | Admitting: Physician Assistant

## 2022-10-24 ENCOUNTER — Encounter: Payer: Self-pay | Admitting: Physician Assistant

## 2022-10-24 VITALS — BP 106/72 | HR 81 | Ht 64.0 in | Wt 135.0 lb

## 2022-10-24 DIAGNOSIS — Z86010 Personal history of colonic polyps: Secondary | ICD-10-CM | POA: Diagnosis not present

## 2022-10-24 DIAGNOSIS — Z8719 Personal history of other diseases of the digestive system: Secondary | ICD-10-CM | POA: Diagnosis not present

## 2022-10-24 MED ORDER — NA SULFATE-K SULFATE-MG SULF 17.5-3.13-1.6 GM/177ML PO SOLN: 1.0000 | Freq: Once | ORAL | 0 refills | Status: AC

## 2022-10-24 NOTE — Patient Instructions (Signed)
You have been scheduled for an endoscopy and colonoscopy. Please follow the written instructions given to you at your visit today.  Please pick up your prep supplies at the pharmacy within the next 1-3 days.  If you use inhalers (even only as needed), please bring them with you on the day of your procedure.  DO NOT TAKE 7 DAYS PRIOR TO TEST- Trulicity (dulaglutide) Ozempic, Wegovy (semaglutide) Mounjaro (tirzepatide) Bydureon Bcise (exanatide extended release)  DO NOT TAKE 1 DAY PRIOR TO YOUR TEST Rybelsus (semaglutide) Adlyxin (lixisenatide) Victoza (liraglutide) Byetta (exanatide) ___________________________________________________________________________   Continue Omeprazole 20 mg daily  Follow up per recommendations after procedures  Due to recent changes in healthcare laws, you may see the results of your imaging and laboratory studies on MyChart before your provider has had a chance to review them.  We understand that in some cases there may be results that are confusing or concerning to you. Not all laboratory results come back in the same time frame and the provider may be waiting for multiple results in order to interpret others.  Please give [place] 48 hours in order for your provider to thoroughly review all the results before contacting the office for clarification of your results.    I appreciate the  opportunity to care for you  Thank You   Jacelyn Grip

## 2022-10-24 NOTE — Progress Notes (Signed)
Chief Complaint: Discuss EGD and colonoscopy  HPI:    [name] is a 72 year old female, assigned to Dr. 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    10/07/2017 EGD with salmon-colored mucosa (unable to read classified Barrett's stage), 4 cm hiatal hernia and otherwise normal.  Repeat recommended in 3 months.    10/07/2017 colonoscopy with reported findings of diverticulosis.  Repeat recommended in 5 years.    05/16/2021 patient seen in clinic by Doug Sou and assigned to Dr. Lavon Paganini, at that time discussed patient had an endoscopy in October 2019 which showed 3 tongues of salmon-colored mucosa from 35 to 37 cm and biopsied in 4 quadrants at intervals of 1.5 cm in the lower third of the esophagus and at the GE junction.  4 cm hiatal hernia.  Biopsy showed Barrett's with no dysplasia.    01/22/2022 CT chest abdomen pelvis with contrast with stable CTs of the chest abdomen and pelvis without evidence of local recurrence or metastatic disease, interval development of a mild superior endplate compression deformity at L5.  Aortic atherosclerosis.    Today, patient presents to clinic and tells me that she just called her old GI clinic again in [place] and she was told that she is due for both her colonoscopy and endoscopy.  She remains on Omeprazole 20 mg daily and has no issues with reflux or heartburn.  Tells me she was having colonoscopies every 5 years given that she had appendix cancer.    Denies fever, chills, weight loss, change in bowel habits, abdominal pain, heartburn or reflux.  Past Medical History:  Diagnosis Date   Adjustment disorder with anxiety    Anxiety    Arthritis    knee,left   Barrett's esophagus    [facility] Provider Based Lic Grp-Needs Endoscopy  in 2023   Cancer [facility])    cervix   Cataract    beginning   GERD (gastroesophageal reflux disease)    H/O ovarian cancer    Heart murmur    History of malignant neoplasm of appendix    Hyperlipidemia    Hyperlipidemia    Lung nodule    Osteopenia    Osteopenia after menopause 09/01/2019   dexa 08/2019   Ovarian cancer (HCC) 2018   Subclinical hypothyroidism 06/15/2019    Past Surgical History:  Procedure Laterality Date   ABDOMINAL HYSTERECTOMY     ANKLE SURGERY  2003   APPENDECTOMY  2018   Cancer/S/P resection and chemo   ARTHROSCOPIC REPAIR ACL Left    BREAST EXCISIONAL BIOPSY     CYST EXCISION Right 1972   Right breast/benign   FEMUR FRACTURE SURGERY Right    Plate with 5 screws   INGUINAL HERNIA REPAIR  11/2007   TONSILLECTOMY     TOTAL KNEE ARTHROPLASTY Left 06/12/2022   Procedure: LEFT TOTAL KNEE ARTHROPLASTY;  Surgeon: Kathryne Hitch, MD;  Location: [facility] OR;  Service: Orthopedics;  Laterality: Left;   VULVAR LESION REMOVAL  07/2007   benign    Current Outpatient Medications  Medication Sig Dispense Refill   acetaminophen (TYLENOL) 325 MG tablet Take 1-2 tablets (325-650 mg total) by mouth every 6 (six) hours as needed for mild pain (pain score 1-3 or temp > 100.5). 90 tablet 1   Calcium Carb-Cholecalciferol (  CALCIUM 600 + D PO) Take 1 tablet by mouth daily.     Cholecalciferol (VITAMIN D3) 50 MCG (2000 UT) CAPS Take 2,000 Units by mouth daily.     cyanocobalamin (VITAMIN B12) 1000 MCG tablet Take 1,000 mcg by mouth daily.     omeprazole (PRILOSEC) 20 MG capsule Take 20 mg by mouth daily.     simvastatin (ZOCOR) 20 MG tablet Take 1 tablet (20 mg total) by mouth at bedtime. 90 tablet 3   No current facility-administered medications for this visit.    Allergies as of 10/24/2022   (No Known Allergies)    Family History  Problem Relation Age of Onset   CAD Mother    Osteoporosis Mother    Diabetes Father    GER disease Father    Rheum arthritis Sister     Asthma Sister    Diabetes Maternal Grandmother        amputation   Colon cancer Neg Hx    Colon polyps Neg Hx    Crohn's disease Neg Hx    Esophageal cancer Neg Hx    Rectal cancer Neg Hx    Stomach cancer Neg Hx    Ulcerative colitis Neg Hx     Social History   Socioeconomic History   Marital status: Married    Spouse name: Not on file   Number of children: Not on file   Years of education: Not on file   Highest education level: Not on file  Occupational History   Not on file  Tobacco Use   Smoking status: Former    Current packs/day: 0.00    Types: Cigarettes    Quit date: 1991    Years since quitting: 33.5    Passive exposure: Past   Smokeless tobacco: Never  Vaping Use   Vaping status: Never Used  Substance and Sexual Activity   Alcohol use: Yes    Comment: Occas   Drug use: Never   Sexual activity: Yes    Partners: Male    Birth control/protection: None, Post-menopausal  Other Topics Concern   Not on file  Social History Narrative   ** Merged History Encounter **       Social Determinants of Health   Financial Resource Strain: Low Risk  (07/28/2021)   Overall Financial Resource Strain (CARDIA)    Difficulty of Paying Living Expenses: Not hard at all  Food Insecurity: No Food Insecurity (07/28/2021)   Hunger Vital Sign    Worried About Running Out of Food in the Last Year: Never true    Ran Out of Food in the Last Year: Never true  Transportation Needs: No Transportation Needs (07/28/2021)   PRAPARE - Administrator, Civil Service (Medical): No    Lack of Transportation (Non-Medical): No  Physical Activity: Sufficiently Active (07/28/2021)   Exercise Vital Sign    Days of Exercise per Week: 7 days    Minutes of Exercise per Session: 120 min  Stress: No Stress Concern Present (07/28/2021)   [employer] of Occupational Health - Occupational Stress Questionnaire    Feeling of Stress : Not at all  Social Connections: Socially Integrated  (07/28/2021)   Social Connection and Isolation Panel [NHANES]    Frequency of Communication with Friends and Family: More than three times a week    Frequency of Social Gatherings with Friends and Family: More than three times a week    Attends Religious Services: More than 4 times per year  Active Member of Clubs or Organizations: Yes    Attends Banker Meetings: 1 to 4 times per year    Marital Status: Married  Catering manager Violence: Not At Risk (07/28/2021)   Humiliation, Afraid, Rape, and Kick questionnaire    Fear of Current or Ex-Partner: No    Emotionally Abused: No    Physically Abused: No    Sexually Abused: No    Review of Systems:    Constitutional: No weight loss, fever or chills Cardiovascular: No chest pain Respiratory: No SOB  Gastrointestinal: See HPI and otherwise negative   Physical Exam:  Vital signs: BP 106/72   Pulse 81   Ht 5\' 4"  (1.626 m)   Wt 135 lb (61.2 kg)   SpO2 96%   BMI 23.17 kg/m    Constitutional:   Pleasant Elderly Caucasian female appears to be in NAD, Well developed, Well nourished, alert and cooperative Respiratory: Respirations even and unlabored. Lungs clear to auscultation bilaterally.   No wheezes, crackles, or rhonchi.  Cardiovascular: Normal S1, S2. No MRG. Regular rate and rhythm. No peripheral edema, cyanosis or pallor.  Gastrointestinal:  Soft, nondistended, nontender. No rebound or guarding. Normal bowel sounds. No appreciable masses or hepatomegaly. Rectal:  Not performed.  Psychiatric: Demonstrates good judgement and reason without abnormal affect or behaviors.  RELEVANT LABS AND IMAGING: CBC    Component Value Date/Time   WBC 7.7 06/13/2022 0405   RBC 4.03 06/13/2022 0405   HGB 12.1 06/13/2022 0405   HGB 13.7 01/22/2022 0832   HCT 37.8 06/13/2022 0405   PLT 224 06/13/2022 0405   PLT 218 01/22/2022 0832   MCV 93.8 06/13/2022 0405   MCH 30.0 06/13/2022 0405   MCHC 32.0 06/13/2022 0405   RDW 12.7  06/13/2022 0405   LYMPHSABS 1.1 05/03/2022 1124   MONOABS 0.5 05/03/2022 1124   EOSABS 0.1 05/03/2022 1124   BASOSABS 0.0 05/03/2022 1124    CMP     Component Value Date/Time   NA 141 06/13/2022 0405   NA 142 05/21/2018 0000   K 3.9 06/13/2022 0405   CL 105 06/13/2022 0405   CO2 27 06/13/2022 0405   GLUCOSE 196 (H) 06/13/2022 0405   BUN 13 06/13/2022 0405   BUN 19 05/21/2018 0000   CREATININE 0.83 06/13/2022 0405   CREATININE 0.93 01/22/2022 0832   CALCIUM 9.1 06/13/2022 0405   PROT 6.5 06/06/2022 0910   ALBUMIN 3.9 06/06/2022 0910   AST 18 06/06/2022 0910   AST 19 01/22/2022 0832   ALT 14 06/06/2022 0910   ALT 14 01/22/2022 0832   ALKPHOS 51 06/06/2022 0910   BILITOT 0.8 06/06/2022 0910   BILITOT 0.7 01/22/2022 0832   GFRNONAA >60 06/13/2022 0405   GFRNONAA >60 01/22/2022 0832   GFRAA >60 12/10/2019 0850    Assessment: 1.  History of Barrett's esophagus: Last EGD in 2019 with repeat recommended in 5 years 2.  History of colon polyps: Per patient, we do not have report  Plan: 1.  We never did actually receive patient's colonoscopy report but recommendations were for repeat in 5 years by her previous gastroenterologist and she thinks she had some polyps at some point.  Will go ahead and schedule her for surveillance EGD given history of Barrett's esophagus and surveillance colonoscopy given history of colon polyps with Dr. Lavon Paganini in [facility].  Did provide the patient a detailed list risks for the procedure and she agrees to proceed. Patient is appropriate for endoscopic procedure(s) in the  ambulatory ([facility]) setting.  2.  Patient wanted to wait until October to be scheduled as she is going on a trip in September.  She will also be scheduled for nurse previsit to discuss prep closer to time. 3.  Patient should continue Omeprazole 20 mg daily for her Barrett's esophagus. 4.  Patient to follow in clinic per recommendations after time of procedures.  Hyacinth Meeker,  PA-C [facility] 10/24/2022, 10:41 AM  Cc: Willow Ora, MD

## 2022-10-31 DIAGNOSIS — M25562 Pain in left knee: Secondary | ICD-10-CM | POA: Diagnosis not present

## 2022-11-08 DIAGNOSIS — M25562 Pain in left knee: Secondary | ICD-10-CM | POA: Diagnosis not present

## 2022-11-21 ENCOUNTER — Encounter: Payer: Self-pay | Admitting: Family Medicine

## 2022-11-21 ENCOUNTER — Ambulatory Visit: Admission: RE | Admit: 2022-11-21 | Payer: PPO | Source: Ambulatory Visit

## 2022-11-21 DIAGNOSIS — M8580 Other specified disorders of bone density and structure, unspecified site: Secondary | ICD-10-CM

## 2022-11-21 DIAGNOSIS — E349 Endocrine disorder, unspecified: Secondary | ICD-10-CM | POA: Diagnosis not present

## 2022-11-21 DIAGNOSIS — Z90722 Acquired absence of ovaries, bilateral: Secondary | ICD-10-CM | POA: Diagnosis not present

## 2022-11-21 DIAGNOSIS — M8588 Other specified disorders of bone density and structure, other site: Secondary | ICD-10-CM | POA: Diagnosis not present

## 2022-11-21 DIAGNOSIS — N958 Other specified menopausal and perimenopausal disorders: Secondary | ICD-10-CM | POA: Diagnosis not present

## 2022-11-21 NOTE — Progress Notes (Signed)
See mychart note.   DEXA 11/2022: lowest T = - 2.1 femoral neck, significantly worse, osteopenia with FRAX major fracture 19.8%; recheck 2 years. Could consider starting meds.    Kristen Stephenson, Thank you for getting your bone density screening test done. I have reviewed the results. Your bones are thinner than when last checked but remain in the low bone density category called osteopenia. Your risk for fracture is elevated; however, I recommend continuation of vit D and calcium and weight bearing exercises. This can slow the progression.  We could consider starting prescription medications if you were interested. You could schedule an appointment if you'd like to discuss further.  Otherwise, we will recheck in 2 years.   Sincerely, Dr. Mardelle Matte

## 2022-11-22 DIAGNOSIS — M25562 Pain in left knee: Secondary | ICD-10-CM | POA: Diagnosis not present

## 2022-11-28 DIAGNOSIS — M25562 Pain in left knee: Secondary | ICD-10-CM | POA: Diagnosis not present

## 2022-12-05 ENCOUNTER — Ambulatory Visit: Payer: PPO | Admitting: Orthopaedic Surgery

## 2022-12-05 ENCOUNTER — Encounter: Payer: Self-pay | Admitting: Orthopaedic Surgery

## 2022-12-05 DIAGNOSIS — Z96652 Presence of left artificial knee joint: Secondary | ICD-10-CM

## 2022-12-05 NOTE — Progress Notes (Signed)
The patient is now 5 months status post a left total knee arthroplasty.  She is a very active 72 year old female.  She will be traveling to [place] in about 3 weeks.  She has completed physical therapy.  She is very active and is worked on a home exercise program.  She does wear a knee sleeve on occasion.  She reports swelling and clicking with the knee.  She denies any instability.  On exam her range of motion is entirely full without left knee.  There is still some swelling to be expected and I gave her reassurance that that is normal.  There is some clicking of the components but that is also normal and I showed her knee replacement model and explained why this is the case.  The knee feels ligamentously stable to me.  From my standpoint I will see her back in 6 months unless there are issues.  Will have an AP and lateral of her left knee at that visit.

## 2022-12-06 ENCOUNTER — Encounter: Payer: Self-pay | Admitting: Family Medicine

## 2022-12-21 ENCOUNTER — Ambulatory Visit
Admission: EM | Admit: 2022-12-21 | Discharge: 2022-12-21 | Disposition: A | Discharge status: Home / Self Care | Payer: PPO | Attending: Family Medicine | Admitting: Family Medicine

## 2022-12-21 ENCOUNTER — Telehealth: Payer: Self-pay | Admitting: Physician Assistant

## 2022-12-21 ENCOUNTER — Other Ambulatory Visit: Payer: Self-pay

## 2022-12-21 ENCOUNTER — Encounter: Payer: Self-pay | Admitting: Emergency Medicine

## 2022-12-21 DIAGNOSIS — L2389 Allergic contact dermatitis due to other agents: Secondary | ICD-10-CM

## 2022-12-21 DIAGNOSIS — T63451A Toxic effect of venom of hornets, accidental (unintentional), initial encounter: Secondary | ICD-10-CM

## 2022-12-21 MED ORDER — PREDNISONE 50 MG PO TABS
60.0000 mg | ORAL_TABLET | Freq: Once | ORAL | Status: AC
  Administered 2022-12-21: 60 mg via ORAL

## 2022-12-21 MED ORDER — FAMOTIDINE 40 MG PO TABS
40.0000 mg | ORAL_TABLET | Freq: Once | ORAL | Status: AC
  Administered 2022-12-21: 40 mg via ORAL

## 2022-12-21 MED ORDER — PREDNISONE 20 MG PO TABS: 20.0000 mg | ORAL_TABLET | Freq: Every day | ORAL | 0 refills | Status: AC

## 2022-12-21 MED ORDER — TRIAMCINOLONE ACETONIDE 0.025 % EX OINT: 1.0000 | TOPICAL_OINTMENT | Freq: Three times a day (TID) | CUTANEOUS | 0 refills | Status: DC | PRN

## 2022-12-21 MED ORDER — DIPHENHYDRAMINE HCL 25 MG PO CAPS
25.0000 mg | ORAL_CAPSULE | Freq: Once | ORAL | Status: AC
  Administered 2022-12-21: 25 mg via ORAL

## 2022-12-21 NOTE — ED Provider Notes (Signed)
Kristen Stephenson    CSN: [id number] Arrival date & time: 12/21/22  1747      History   Chief Complaint Chief Complaint  Patient presents with   Insect Bite    HPI Kristen Stephenson is a 72 y.o. female.   HPI Patient presents today for evaluation of hand swelling after being stung by a wasp while grilling at home. She immediately notifed that right third finger began to swelling and was redden. Now she notes swelling expanding the entire hand and up the right arm. Denies any shortness of breath, facial swelling, or chest tightness.   Past Medical History:  Diagnosis Date   Adjustment disorder with anxiety    Anxiety    Arthritis    knee,left   Barrett's esophagus    [facility] Provider Based Lic Grp-Needs Endoscopy in 2023   Cancer [facility])    cervix   Cataract    beginning   GERD (gastroesophageal reflux disease)    H/O ovarian cancer    Heart murmur    History of malignant neoplasm of appendix    Hyperlipidemia    Hyperlipidemia    Lung nodule    Osteopenia    Osteopenia after menopause 09/01/2019   dexa 08/2019   Ovarian cancer (HCC) 2018   Subclinical hypothyroidism 06/15/2019    Patient Active Problem List   Diagnosis Date Noted   Status post total left knee replacement 06/12/2022   Vitamin B12 deficiency 05/14/2022   Long-term current use of proton pump inhibitor therapy 05/03/2022   Lumbar and sacral spondyloarthritis 05/03/2022   Barrett's esophagus without dysplasia 04/19/2021   Postcoital UTI - recurrent 02/03/2020   Pulmonary nodules 09/23/2019   Presbycusis of both ears 09/23/2019   Osteopenia after menopause 09/01/2019   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   Anxiety 09/16/2018   History of ovarian cancer 09/16/2018   Mixed hyperlipidemia 09/16/2018   GERD (gastroesophageal reflux disease) 09/16/2018    Past Surgical History:  Procedure Laterality Date   ABDOMINAL HYSTERECTOMY     ANKLE SURGERY  2003   APPENDECTOMY  2018    Cancer/S/P resection and chemo   ARTHROSCOPIC REPAIR ACL Left    BREAST EXCISIONAL BIOPSY     CYST EXCISION Right 1972   Right breast/benign   FEMUR FRACTURE SURGERY Right    Plate with 5 screws   INGUINAL HERNIA REPAIR  11/2007   TONSILLECTOMY     TOTAL KNEE ARTHROPLASTY Left 06/12/2022   Procedure: LEFT TOTAL KNEE ARTHROPLASTY;  Surgeon: Kathryne Hitch, MD;  Location: [facility] OR;  Service: Orthopedics;  Laterality: Left;   VULVAR LESION REMOVAL  07/2007   benign    OB History   No obstetric history on file.      Home Medications    Prior to Admission medications   Medication Sig Start Date End Date Taking? Authorizing Provider  predniSONE (DELTASONE) 20 MG tablet Take 1 tablet (20 mg total) by mouth daily with breakfast for 5 days. 12/21/22 12/26/22 Yes Bing Neighbors, NP  triamcinolone (KENALOG) 0.025 % ointment Apply 1 Application topically 3 (three) times daily as needed (affected area). 12/21/22  Yes Bing Neighbors, NP  acetaminophen (TYLENOL) 325 MG tablet Take 1-2 tablets (325-650 mg total) by mouth every 6 (six) hours as needed for mild pain (pain score 1-3 or temp > 100.5). 06/13/22   Kirtland Bouchard, PA-C  Calcium Carb-Cholecalciferol (CALCIUM 600 + D PO) Take 1 tablet by mouth daily.  [provider]  Cholecalciferol (VITAMIN D3) 50 MCG (2000 UT) CAPS Take 2,000 Units by mouth daily.    [provider]  cyanocobalamin (VITAMIN B12) 1000 MCG tablet Take 1,000 mcg by mouth daily.    [provider]  omeprazole (PRILOSEC) 20 MG capsule Take 20 mg by mouth daily.    [provider]  simvastatin (ZOCOR) 20 MG tablet Take 1 tablet (20 mg total) by mouth at bedtime. 05/03/22   Willow Ora, MD    Family History Family History  Problem Relation Age of Onset   CAD Mother    Osteoporosis Mother    Diabetes Father    GER disease Father    Rheum arthritis Sister    Asthma Sister    Diabetes Maternal Grandmother         amputation   Colon cancer Neg Hx    Colon polyps Neg Hx    Crohn's disease Neg Hx    Esophageal cancer Neg Hx    Rectal cancer Neg Hx    Stomach cancer Neg Hx    Ulcerative colitis Neg Hx     Social History Social History   Tobacco Use   Smoking status: Former    Current packs/day: 0.00    Types: Cigarettes    Quit date: 1991    Years since quitting: 33.7    Passive exposure: Past   Smokeless tobacco: Never  Vaping Use   Vaping status: Never Used  Substance Use Topics   Alcohol use: Yes    Comment: Occas   Drug use: Never     Allergies   Patient has no known allergies.   Review of Systems Review of Systems Pertinent negatives listed in HPI   Physical Exam Triage Vital Signs ED Triage Vitals  Encounter Vitals Group     BP 12/21/22 1804 118/73     Systolic BP Percentile --      Diastolic BP Percentile --      Pulse Rate 12/21/22 1804 63     Resp 12/21/22 1804 16     Temp 12/21/22 1804 98 F (36.7 C)     Temp src --      SpO2 12/21/22 1804 95 %     Weight --      Height --      Head Circumference --      Peak Flow --      Pain Score 12/21/22 1801 5     Pain Loc --      Pain Education --      Exclude from Growth Chart --    No data found.  Updated Vital Signs BP 118/73   Pulse 63   Temp 98 F (36.7 C)   Resp 16   SpO2 95%   Visual Acuity Right Eye Distance:   Left Eye Distance:   Bilateral Distance:    Right Eye Near:   Left Eye Near:    Bilateral Near:     Physical Exam Vitals and nursing note reviewed.  Constitutional:      Appearance: Normal appearance.  HENT:     Head: Normocephalic and atraumatic.     Nose: Nose normal.  Eyes:     Extraocular Movements: Extraocular movements intact.     Pupils: Pupils are equal, round, and reactive to light.  Cardiovascular:     Rate and Rhythm: Normal rate and regular rhythm.  Pulmonary:     Effort: Pulmonary effort is normal.     Breath  sounds: Normal breath sounds.  Musculoskeletal:        Arms:     Cervical back: Normal range of motion.  Neurological:     Mental Status: She is alert.      UC Treatments / Results  Labs (all labs ordered are listed, but only abnormal results are displayed) Labs Reviewed - No data to display  EKG   Radiology No results found.  Procedures Procedures (including critical care time)  Medications Ordered in UC Medications  predniSONE (DELTASONE) tablet 60 mg (60 mg Oral Given 12/21/22 1852)  famotidine (PEPCID) tablet 40 mg (40 mg Oral Given 12/21/22 1852)  diphenhydrAMINE (BENADRYL) capsule 25 mg (25 mg Oral Given 12/21/22 1852)    Initial Impression / Assessment and Plan / UC Course  I have reviewed the triage vital signs and the nursing notes.  Pertinent labs & imaging results that were available during my care of the patient were reviewed by me and considered in my medical decision making (see chart for details).   Allergic reaction, contact dermatitis secondary to hornet sting  Decadron 10 mg IM, Prednisone 60 mg once here in clinic, and benadryl 25 ng orally given here in clinic. Home management per discharge orders. ED precautions given if any Red Flag symptoms develop. Patient verbalized understanding and agreement with plan.  Final Clinical Impressions(s) / UC Diagnoses   Final diagnoses:  Allergic contact dermatitis due to other agents  Hornet sting, accidental or unintentional, initial encounter     Discharge Instructions      Tomorrow, start prednisone 20 mg daily for 4 days if the swelling, itching and redness is still present. If symptoms of allergic reaction have resolved do not start prednisone. Recommend taking benadryl 25 mg every 6 hours for the next 24 hours. Before you go to bed take one dose of Benadryl 25 mg.  If any of your symptoms appear to worsen or do not improve return for evaluation.     ED Prescriptions     Medication Sig Dispense Auth. Provider   triamcinolone (KENALOG) 0.025 % ointment  Apply 1 Application topically 3 (three) times daily as needed (affected area). 80 g Bing Neighbors, NP   predniSONE (DELTASONE) 20 MG tablet Take 1 tablet (20 mg total) by mouth daily with breakfast for 5 days. 5 tablet Bing Neighbors, NP      PDMP not reviewed this encounter.   Bing Neighbors, NP 12/24/22 [phone]

## 2022-12-21 NOTE — Telephone Encounter (Signed)
Patient called has questions regarding her prep.

## 2022-12-21 NOTE — ED Triage Notes (Signed)
Patient presents to [facility] for evaluation of wasp sting to right middle knuckle, swelling is progressing up her arm.  Denies any SOB or denies any chest pain or facial swelling

## 2022-12-21 NOTE — Discharge Instructions (Addendum)
Tomorrow, start prednisone 20 mg daily for 4 days if the swelling, itching and redness is still present. If symptoms of allergic reaction have resolved do not start prednisone. Recommend taking benadryl 25 mg every 6 hours for the next 24 hours. Before you go to bed take one dose of Benadryl 25 mg.  If any of your symptoms appear to worsen or do not improve return for evaluation.

## 2022-12-24 NOTE — Telephone Encounter (Signed)
Called and left patient a message to call back with her questions or concerns. Her procedure was scheduled back in July with Suprep

## 2022-12-24 NOTE — Telephone Encounter (Signed)
Answered questions for patient, she was reading the paper insert in the prep kit and had questions about that. I told her do not go by the instructions inside the kit only the instructions we gave her in the office.  She had concerns with using the prep kit and having reflux.

## 2023-01-01 ENCOUNTER — Encounter: Payer: Self-pay | Admitting: Gastroenterology

## 2023-01-08 ENCOUNTER — Telehealth: Payer: Self-pay | Admitting: Physician Assistant

## 2023-01-08 NOTE — Telephone Encounter (Signed)
Patient called requesting a call back to discuss insurance benefits for upcoming procedure.

## 2023-01-11 NOTE — Telephone Encounter (Signed)
Pt called she has a procedure on [place]ar [place] and is getting over a "cold." She has no fever and is on the mend wanted to make sure she was ok to proceed. Reassured her she would be fine but that if she started feeling worse and or running a fever to let us know. PT verbalized understanding.

## 2023-01-11 NOTE — Telephone Encounter (Signed)
Inbound call from patient states she has a cold and would like to speak with a nurse. Please advise.

## 2023-01-14 ENCOUNTER — Encounter: Payer: Self-pay | Admitting: Gastroenterology

## 2023-01-14 ENCOUNTER — Ambulatory Visit: Payer: PPO | Admitting: Gastroenterology

## 2023-01-14 VITALS — BP 111/60 | HR 74 | Temp 98.2°F | Resp 12 | Ht 64.0 in | Wt 135.0 lb

## 2023-01-14 DIAGNOSIS — Z8719 Personal history of other diseases of the digestive system: Secondary | ICD-10-CM

## 2023-01-14 DIAGNOSIS — Z860101 Personal history of adenomatous and serrated colon polyps: Secondary | ICD-10-CM

## 2023-01-14 DIAGNOSIS — K2270 Barrett's esophagus without dysplasia: Secondary | ICD-10-CM

## 2023-01-14 DIAGNOSIS — Z09 Encounter for follow-up examination after completed treatment for conditions other than malignant neoplasm: Secondary | ICD-10-CM

## 2023-01-14 DIAGNOSIS — K2100 Gastro-esophageal reflux disease with esophagitis, without bleeding: Secondary | ICD-10-CM | POA: Diagnosis not present

## 2023-01-14 DIAGNOSIS — D123 Benign neoplasm of transverse colon: Secondary | ICD-10-CM

## 2023-01-14 DIAGNOSIS — F419 Anxiety disorder, unspecified: Secondary | ICD-10-CM | POA: Diagnosis not present

## 2023-01-14 DIAGNOSIS — E039 Hypothyroidism, unspecified: Secondary | ICD-10-CM | POA: Diagnosis not present

## 2023-01-14 DIAGNOSIS — Z860100 Personal history of colon polyps, unspecified: Secondary | ICD-10-CM

## 2023-01-14 MED ORDER — PANTOPRAZOLE SODIUM 40 MG PO TBEC: 40.0000 mg | DELAYED_RELEASE_TABLET | Freq: Every day | ORAL | 3 refills | Status: DC

## 2023-01-14 MED ORDER — SODIUM CHLORIDE 0.9 % IV SOLN: 500.0000 mL | INTRAVENOUS | Status: DC

## 2023-01-14 NOTE — Progress Notes (Signed)
Pt resting comfortably. VSS. Airway intact. SBAR complete to RN. All questions answered.   

## 2023-01-14 NOTE — Op Note (Signed)
[facility] Patient Name: Kristen Stephenson Procedure Date: 01/14/2023 9:22 AM MRN: [id number] Endoscopist: Napoleon Form , MD, [id number] Age: 72 Referring MD:  Date of Birth: [birth year] Gender: Female Account #: [id number] Procedure:                Upper GI endoscopy Indications:              Surveillance for malignancy due to personal history                            of Barrett's esophagus Medicines:                Monitored Anesthesia Care Procedure:                Pre-Anesthesia Assessment:                           - Prior to the procedure, a History and Physical                            was performed, and patient medications and                            allergies were reviewed. The patient's tolerance of                            previous anesthesia was also reviewed. The risks                            and benefits of the procedure and the sedation                            options and risks were discussed with the patient.                            All questions were answered, and informed consent                            was obtained. Prior Anticoagulants: The patient has                            taken no anticoagulant or antiplatelet agents. ASA                            Grade Assessment: II - A patient with mild systemic                            disease. After reviewing the risks and benefits,                            the patient was deemed in satisfactory condition to                            undergo the procedure.  After obtaining informed consent, the endoscope was                            passed under direct vision. Throughout the                            procedure, the patient's blood pressure, pulse, and                            oxygen saturations were monitored continuously. The                            Olympus Scope [id number] was introduced through the                            mouth, and advanced to the  second part of duodenum.                            The upper GI endoscopy was accomplished without                            difficulty. The patient tolerated the procedure                            well. Scope In: Scope Out: Findings:                 There were esophageal mucosal changes consistent                            with long-segment Barrett's esophagus present in                            the lower third of the esophagus. The maximum                            longitudinal extent of these mucosal changes was 4                            cm in length. Mucosa was biopsied with a cold                            forceps for histology in a targeted manner at                            intervals of 1 cm at 35 and 39 cm from the                            incisors. One specimen bottle was sent to pathology.                           LA Grade B (one or more mucosal breaks greater than  5 mm, not extending between the tops of two mucosal                            folds) esophagitis with no bleeding was found 33 to                            36 cm from the incisors.                           A 4 cm hiatal hernia was present.                           The stomach was normal.                           The cardia and gastric fundus were normal on                            retroflexion.                           The examined duodenum was normal. Complications:            No immediate complications. Estimated Blood Loss:     Estimated blood loss was minimal. Impression:               - Esophageal mucosal changes consistent with                            long-segment Barrett's esophagus. Biopsied.                           - LA Grade B reflux esophagitis with no bleeding.                           - 4 cm hiatal hernia.                           - Normal stomach.                           - Normal examined duodenum. Recommendation:           - Patient has a  contact number available for                            emergencies. The signs and symptoms of potential                            delayed complications were discussed with the                            patient. Return to normal activities tomorrow.                            Written discharge instructions were provided to the  patient.                           - Resume previous diet.                           - Continue present medications.                           - Await pathology results.                           - Follow an antireflux regimen.                           - Use Protonix (pantoprazole) 40 mg PO daily. Rx                            for 90 days with 3 refills. DC omeprazole Napoleon Form, MD 01/14/2023 10:05:48 AM This report has been signed electronically.

## 2023-01-14 NOTE — Patient Instructions (Addendum)
Thank you for letting us take care of your healthcare needs today. Please see handouts given to you on Polyps, Diverticulosis, Hemorrhoids, Hiatal Hernia. New Rx sent to your pharmacy for the irritation in your esophagus. Discontinue the Omeprazole.    YOU HAD AN ENDOSCOPIC PROCEDURE TODAY AT THE [facility]:   Refer to the procedure report that was given to you for any specific questions about what was found during the examination.  If the procedure report does not answer your questions, please call your gastroenterologist to clarify.  If you requested that your care partner not be given the details of your procedure findings, then the procedure report has been included in a sealed envelope for you to review at your convenience later.  YOU SHOULD EXPECT: Some feelings of bloating in the abdomen. Passage of more gas than usual.  Walking can help get rid of the air that was put into your GI tract during the procedure and reduce the bloating. If you had a lower endoscopy (such as a colonoscopy or flexible sigmoidoscopy) you may notice spotting of blood in your stool or on the toilet paper. If you underwent a bowel prep for your procedure, you may not have a normal bowel movement for a few days.  Please Note:  You might notice some irritation and congestion in your nose or some drainage.  This is from the oxygen used during your procedure.  There is no need for concern and it should clear up in a day or so.  SYMPTOMS TO REPORT IMMEDIATELY:  Following lower endoscopy (colonoscopy or flexible sigmoidoscopy):  Excessive amounts of blood in the stool  Significant tenderness or worsening of abdominal pains  Swelling of the abdomen that is new, acute  Fever of 100F or higher  Following upper endoscopy (EGD)  Vomiting of blood or coffee ground material  New chest pain or pain under the shoulder blades  Painful or persistently difficult swallowing  New shortness of breath  Fever of 100F  or higher  Black, tarry-looking stools  For urgent or emergent issues, a gastroenterologist can be reached at any hour by calling [phone]. Do not use MyChart messaging for urgent concerns.    DIET:  We do recommend a small meal at first, but then you may proceed to your regular diet.  Drink plenty of fluids but you should avoid alcoholic beverages for 24 hours.  ACTIVITY:  You should plan to take it easy for the rest of today and you should NOT DRIVE or use heavy machinery until tomorrow (because of the sedation medicines used during the test).    FOLLOW UP: Our staff will call the number listed on your records the next business day following your procedure.  We will call around 7:15- 8:00 am to check on you and address any questions or concerns that you may have regarding the information given to you following your procedure. If we do not reach you, we will leave a message.     If any biopsies were taken you will be contacted by phone or by letter within the next 1-3 weeks.  Please call us at [phone] if you have not heard about the biopsies in 3 weeks.    SIGNATURES/CONFIDENTIALITY: You and/or your care partner have signed paperwork which will be entered into your electronic medical record.  These signatures attest to the fact that that the information above on your After Visit Summary has been reviewed and is understood.  Full responsibility of the  confidentiality of this discharge information lies with you and/or your care-partner.

## 2023-01-14 NOTE — Progress Notes (Unsigned)
[facility] History and Physical   Primary Care Physician:  Willow Ora, MD   Reason for Procedure:  H/o adenomatous colon polyps and Barrett's esophagus  Plan:    EGD and colonoscopy with possible interventions as needed     HPI: Kristen Stephenson is a very pleasant 72 y.o. female here for EGD and colonoscopy for surveillance of Barrett's esophagus and h/o adenomatous colon polyps.   The risks and benefits as well as alternatives of endoscopic procedure(s) have been discussed and reviewed. All questions answered. The patient agrees to proceed.    Past Medical History:  Diagnosis Date   Adjustment disorder with anxiety    Anxiety    Arthritis    knee,left   Barrett's esophagus    [facility] Provider Based Lic Grp-Needs Endoscopy in 2023   Cancer [facility])    cervix   Cataract    beginning   GERD (gastroesophageal reflux disease)    H/O ovarian cancer    Heart murmur    History of malignant neoplasm of appendix    Hyperlipidemia    Hyperlipidemia    Lung nodule    Osteopenia    Osteopenia after menopause 09/01/2019   dexa 08/2019   Ovarian cancer (HCC) 2018   Subclinical hypothyroidism 06/15/2019    Past Surgical History:  Procedure Laterality Date   ABDOMINAL HYSTERECTOMY     ANKLE SURGERY  2003   APPENDECTOMY  2018   Cancer/S/P resection and chemo   ARTHROSCOPIC REPAIR ACL Left    BREAST EXCISIONAL BIOPSY     CYST EXCISION Right 1972   Right breast/benign   FEMUR FRACTURE SURGERY Right    Plate with 5 screws   INGUINAL HERNIA REPAIR  11/2007   TONSILLECTOMY     TOTAL KNEE ARTHROPLASTY Left 06/12/2022   Procedure: LEFT TOTAL KNEE ARTHROPLASTY;  Surgeon: Kathryne Hitch, MD;  Location: [facility] OR;  Service: Orthopedics;  Laterality: Left;   VULVAR LESION REMOVAL  07/2007   benign    Prior to Admission medications   Medication Sig Start Date End Date Taking? Authorizing Provider  Cholecalciferol (VITAMIN D3) 50 MCG (2000 UT) CAPS Take 2,000  Units by mouth daily.   Yes [provider]  omeprazole (PRILOSEC) 20 MG capsule Take 20 mg by mouth daily.   Yes [provider]  simvastatin (ZOCOR) 20 MG tablet Take 1 tablet (20 mg total) by mouth at bedtime. 05/03/22  Yes Willow Ora, MD  acetaminophen (TYLENOL) 325 MG tablet Take 1-2 tablets (325-650 mg total) by mouth every 6 (six) hours as needed for mild pain (pain score 1-3 or temp > 100.5). 06/13/22   Kirtland Bouchard, PA-C  Calcium Carb-Cholecalciferol (CALCIUM 600 + D PO) Take 1 tablet by mouth daily.    [provider]  COMIRNATY syringe Inject 0.3 mLs into the muscle once. 10/27/22   [provider]  FLUZONE HIGH-DOSE 0.5 ML injection Inject 0.5 mLs into the muscle once. 11/28/22   [provider]    Current Outpatient Medications  Medication Sig Dispense Refill   Cholecalciferol (VITAMIN D3) 50 MCG (2000 UT) CAPS Take 2,000 Units by mouth daily.     omeprazole (PRILOSEC) 20 MG capsule Take 20 mg by mouth daily.     simvastatin (ZOCOR) 20 MG tablet Take 1 tablet (20 mg total) by mouth at bedtime. 90 tablet 3   acetaminophen (TYLENOL) 325 MG tablet Take 1-2 tablets (325-650 mg total) by mouth every 6 (six) hours as needed for  mild pain (pain score 1-3 or temp > 100.5). 90 tablet 1   Calcium Carb-Cholecalciferol (CALCIUM 600 + D PO) Take 1 tablet by mouth daily.     COMIRNATY syringe Inject 0.3 mLs into the muscle once.     FLUZONE HIGH-DOSE 0.5 ML injection Inject 0.5 mLs into the muscle once.     Current Facility-Administered Medications  Medication Dose Route Frequency Provider Last Rate Last Admin   0.9 %  sodium chloride infusion  500 mL Intravenous Continuous Girolamo Lortie, Eleonore Chiquito, MD        Allergies as of 01/14/2023   (No Known Allergies)    Family History  Problem Relation Age of Onset   CAD Mother    Osteoporosis Mother    Diabetes Father    GER disease Father    Rheum arthritis Sister    Asthma Sister    Diabetes  Maternal Grandmother        amputation   Colon cancer Neg Hx    Colon polyps Neg Hx    Crohn's disease Neg Hx    Esophageal cancer Neg Hx    Rectal cancer Neg Hx    Stomach cancer Neg Hx    Ulcerative colitis Neg Hx     Social History   Socioeconomic History   Marital status: Married    Spouse name: Not on file   Number of children: Not on file   Years of education: Not on file   Highest education level: Not on file  Occupational History   Not on file  Tobacco Use   Smoking status: Former    Current packs/day: 0.00    Types: Cigarettes    Quit date: 1991    Years since quitting: 33.7    Passive exposure: Past   Smokeless tobacco: Never  Vaping Use   Vaping status: Never Used  Substance and Sexual Activity   Alcohol use: Yes    Comment: Occas   Drug use: Never   Sexual activity: Yes    Partners: Male    Birth control/protection: None, Post-menopausal  Other Topics Concern   Not on file  Social History Narrative   ** Merged History Encounter **       Social Determinants of Health   Financial Resource Strain: Low Risk  (07/28/2021)   Overall Financial Resource Strain (CARDIA)    Difficulty of Paying Living Expenses: Not hard at all  Food Insecurity: No Food Insecurity (07/28/2021)   Hunger Vital Sign    Worried About Running Out of Food in the Last Year: Never true    Ran Out of Food in the Last Year: Never true  Transportation Needs: No Transportation Needs (07/28/2021)   PRAPARE - Administrator, Civil Service (Medical): No    Lack of Transportation (Non-Medical): No  Physical Activity: Sufficiently Active (07/28/2021)   Exercise Vital Sign    Days of Exercise per Week: 7 days    Minutes of Exercise per Session: 120 min  Stress: No Stress Concern Present (07/28/2021)   [employer] of Occupational Health - Occupational Stress Questionnaire    Feeling of Stress : Not at all  Social Connections: Socially Integrated (07/28/2021)   Social  Connection and Isolation Panel [NHANES]    Frequency of Communication with Friends and Family: More than three times a week    Frequency of Social Gatherings with Friends and Family: More than three times a week    Attends Religious Services: More than 4 times per year  Active Member of Clubs or Organizations: Yes    Attends Banker Meetings: 1 to 4 times per year    Marital Status: Married  Catering manager Violence: Not At Risk (07/28/2021)   Humiliation, Afraid, Rape, and Kick questionnaire    Fear of Current or Ex-Partner: No    Emotionally Abused: No    Physically Abused: No    Sexually Abused: No    Review of Systems:  All other review of systems negative except as mentioned in the HPI.  Physical Exam: Vital signs in last 24 hours: BP 111/70   Pulse 70   Temp 98.2 F (36.8 C)   Ht 5\' 4"  (1.626 m)   Wt 135 lb (61.2 kg)   SpO2 95%   BMI 23.17 kg/m  General:   Alert, NAD Lungs:  Clear .   Heart:  Regular rate and rhythm Abdomen:  Soft, nontender and nondistended. Neuro/Psych:  Alert and cooperative. Normal mood and affect. A and O x 3  Reviewed labs, radiology imaging, old records and pertinent past GI work up  Patient is appropriate for planned procedure(s) and anesthesia in an ambulatory setting   K. Scherry Ran , MD [phone]

## 2023-01-14 NOTE — Progress Notes (Unsigned)
Called to room to assist during endoscopic procedure.  Patient ID and intended procedure confirmed with present staff. Received instructions for my participation in the procedure from the performing physician.  

## 2023-01-14 NOTE — Op Note (Signed)
[facility] Patient Name: Kristen Stephenson Procedure Date: 01/14/2023 9:21 AM MRN: [id number] Endoscopist: Napoleon Form , MD, [id number] Age: 72 Referring MD:  Date of Birth: Jan 31, 1951 Gender: Female Account #: [id number] Procedure:                Colonoscopy Indications:              High risk colon cancer surveillance: Personal                            history of colonic polyps, High risk colon cancer                            surveillance: Personal history of adenoma (10 mm or                            greater in size), Last colonoscopy 5 years ago Medicines:                Monitored Anesthesia Care Procedure:                Pre-Anesthesia Assessment:                           - Prior to the procedure, a History and Physical                            was performed, and patient medications and                            allergies were reviewed. The patient's tolerance of                            previous anesthesia was also reviewed. The risks                            and benefits of the procedure and the sedation                            options and risks were discussed with the patient.                            All questions were answered, and informed consent                            was obtained. Prior Anticoagulants: The patient has                            taken no anticoagulant or antiplatelet agents. ASA                            Grade Assessment: II - A patient with mild systemic                            disease. After reviewing the risks and benefits,  the patient was deemed in satisfactory condition to                            undergo the procedure.                           After obtaining informed consent, the colonoscope                            was passed under direct vision. Throughout the                            procedure, the patient's blood pressure, pulse, and                            oxygen  saturations were monitored continuously. The                            [id number]-[id number] Colonoscope [id number] was introduced                            through the anus and advanced to the the cecum,                            identified by appendiceal orifice and ileocecal                            valve. The colonoscopy was performed without                            difficulty. The patient tolerated the procedure                            well. The quality of the bowel preparation was                            good. The ileocecal valve, appendiceal orifice, and                            rectum were photographed. Scope In: 9:38:31 AM Scope Out: 9:51:22 AM Scope Withdrawal Time: 0 hours 9 minutes 34 seconds  Total Procedure Duration: 0 hours 12 minutes 51 seconds  Findings:                 The perianal and digital rectal examinations were                            normal.                           A 5 mm polyp was found in the transverse colon. The                            polyp was sessile. The polyp was removed with a  cold snare. Resection and retrieval were complete.                           Scattered small-mouthed diverticula were found in                            the sigmoid colon and descending colon. There was                            no evidence of diverticular bleeding.                           Non-bleeding external and internal hemorrhoids were                            found during retroflexion. The hemorrhoids were                            medium-sized. Complications:            No immediate complications. Estimated Blood Loss:     Estimated blood loss was minimal. Impression:               - One 5 mm polyp in the transverse colon, removed                            with a cold snare. Resected and retrieved.                           - Moderate diverticulosis in the sigmoid colon and                            in the descending colon.  There was no evidence of                            diverticular bleeding.                           - Non-bleeding external and internal hemorrhoids. Recommendation:           - Patient has a contact number available for                            emergencies. The signs and symptoms of potential                            delayed complications were discussed with the                            patient. Return to normal activities tomorrow.                            Written discharge instructions were provided to the                            patient.                           -  Resume previous diet.                           - Continue present medications.                           - Await pathology results.                           - Repeat colonoscopy in 5 years for surveillance                            based on pathology results. Napoleon Form, MD 01/14/2023 10:01:15 AM This report has been signed electronically.

## 2023-01-15 ENCOUNTER — Telehealth: Payer: Self-pay

## 2023-01-15 NOTE — Telephone Encounter (Signed)
  Follow up Call-     01/14/2023    8:43 AM  Call back number  Post procedure Call Back phone  # [phone]  Permission to leave phone message Yes     Patient questions:  Do you have a fever, pain , or abdominal swelling? No. Pain Score  0 *  Have you tolerated food without any problems? Yes.    Have you been able to return to your normal activities? Yes.    Do you have any questions about your discharge instructions: Diet   No. Medications  No. Follow up visit  No.  Do you have questions or concerns about your Care? No.  Actions: * If pain score is 4 or above: No action needed, pain <4.

## 2023-01-16 LAB — SURGICAL PATHOLOGY

## 2023-01-23 ENCOUNTER — Encounter: Payer: Self-pay | Admitting: *Deleted

## 2023-01-23 ENCOUNTER — Encounter: Payer: Self-pay | Admitting: Hematology & Oncology

## 2023-01-23 ENCOUNTER — Inpatient Hospital Stay: Payer: PPO | Admitting: Hematology & Oncology

## 2023-01-23 ENCOUNTER — Inpatient Hospital Stay: Payer: PPO | Attending: Hematology & Oncology

## 2023-01-23 VITALS — BP 120/74 | HR 65 | Temp 98.1°F | Resp 20 | Ht 64.0 in | Wt 134.0 lb

## 2023-01-23 DIAGNOSIS — Z85038 Personal history of other malignant neoplasm of large intestine: Secondary | ICD-10-CM | POA: Diagnosis not present

## 2023-01-23 DIAGNOSIS — D373 Neoplasm of uncertain behavior of appendix: Secondary | ICD-10-CM

## 2023-01-23 LAB — CBC WITH DIFFERENTIAL (CANCER CENTER ONLY)
Abs Immature Granulocytes: 0.02 10*3/uL (ref 0.00–0.07)
Basophils Absolute: 0 10*3/uL (ref 0.0–0.1)
Basophils Relative: 0 %
Eosinophils Absolute: 0.2 10*3/uL (ref 0.0–0.5)
Eosinophils Relative: 3 %
HCT: 41.8 % (ref 36.0–46.0)
Hemoglobin: 13.5 g/dL (ref 12.0–15.0)
Immature Granulocytes: 0 %
Lymphocytes Relative: 12 %
Lymphs Abs: 0.9 10*3/uL (ref 0.7–4.0)
MCH: 29.9 pg (ref 26.0–34.0)
MCHC: 32.3 g/dL (ref 30.0–36.0)
MCV: 92.7 fL (ref 80.0–100.0)
Monocytes Absolute: 0.4 10*3/uL (ref 0.1–1.0)
Monocytes Relative: 6 %
Neutro Abs: 5.7 10*3/uL (ref 1.7–7.7)
Neutrophils Relative %: 79 %
Platelet Count: 251 10*3/uL (ref 150–400)
RBC: 4.51 MIL/uL (ref 3.87–5.11)
RDW: 12.4 % (ref 11.5–15.5)
WBC Count: 7.3 10*3/uL (ref 4.0–10.5)
nRBC: 0 % (ref 0.0–0.2)

## 2023-01-23 LAB — CMP (CANCER CENTER ONLY)
ALT: 7 U/L (ref 0–44)
AST: 13 U/L — ABNORMAL LOW (ref 15–41)
Albumin: 4.1 g/dL (ref 3.5–5.0)
Alkaline Phosphatase: 58 U/L (ref 38–126)
Anion gap: 8 (ref 5–15)
BUN: 18 mg/dL (ref 8–23)
CO2: 30 mmol/L (ref 22–32)
Calcium: 9.3 mg/dL (ref 8.9–10.3)
Chloride: 107 mmol/L (ref 98–111)
Creatinine: 0.72 mg/dL (ref 0.44–1.00)
GFR, Estimated: >60 mL/min (ref >60)
Glucose, Bld: 105 mg/dL — ABNORMAL HIGH (ref 70–99)
Potassium: 4.1 mmol/L (ref 3.5–5.1)
Sodium: 145 mmol/L (ref 135–145)
Total Bilirubin: 0.7 mg/dL (ref 0.3–1.2)
Total Protein: 6.6 g/dL (ref 6.5–8.1)

## 2023-01-23 LAB — CEA (IN HOUSE-CHCC): CEA (CHCC-In House): 1.28 ng/mL (ref 0.00–5.00)

## 2023-01-23 LAB — LACTATE DEHYDROGENASE: LDH: 192 U/L (ref 98–192)

## 2023-01-23 NOTE — Progress Notes (Signed)
Hematology and Oncology Follow Up Visit  Kristen Stephenson [id number] January 08, 1951 72 y.o. 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  We see her every year.  Since her last saw her, she been doing quite well.  Actually, she just got back from walking on the [address] in [place].  She really enjoyed this.  She really just got back from [place].  She is out there for the weekend with his son and grandchildren.  She definitely enjoyed this.  She has had no abdominal pain.  There is been no change in bowel or bladder habits.  She has had no cough or shortness of breath.  She has had no problems with COVID.  She said when she travels, she wears a mask.  When we last saw her, her CEA is 1.53.  She had a CT scan that was done back in October 2023.  We did not find anything that showed recurrent disease.  At this point, I really do not think we have to do any additional scans.  She has had no fever.  Overall, I would say that her performance status is probably ECOG 0.      Medications:  Current Outpatient Medications:    Calcium Carb-Cholecalciferol (CALCIUM 600 + D PO), Take 1 tablet by mouth daily., Disp: , Rfl:    Cholecalciferol (VITAMIN D3) 50 MCG (2000 UT) CAPS, Take 2,000 Units by mouth daily., Disp: , Rfl:    pantoprazole (PROTONIX) 40 MG tablet, Take 1 tablet (40 mg total) by mouth daily., Disp: 90 tablet, Rfl: 3   simvastatin (ZOCOR) 20 MG tablet, Take 1 tablet (20 mg total) by mouth at bedtime., Disp: 90 tablet, Rfl: 3  Allergies: No Known Allergies  Past Medical History, Surgical history, Social history, and Family History were reviewed and updated.  Review of Systems: Review of Systems  Constitutional: Negative.   HENT:  Negative.    Eyes: Negative.    Respiratory: Negative.    Cardiovascular: Negative.   Gastrointestinal:  Positive for abdominal pain.  Endocrine: Negative.   Genitourinary: Negative.    Musculoskeletal: Negative.   Skin: Negative.   Neurological: Negative.   Hematological: Negative.   Psychiatric/Behavioral: Negative.      Physical Exam:  height is 5\' 4"  (1.626 m) and weight is 134 lb (60.8 kg). Her oral temperature is 98.1 F (36.7 C). Her blood pressure is 120/74 and her pulse is 65. Her respiration is 20 and oxygen saturation is 95%.   Wt Readings from Last 3 Encounters:  01/23/23 134 lb (60.8 kg)  01/14/23 135 lb (61.2 kg)  10/24/22 135 lb (61.2 kg)    Physical Exam Vitals reviewed.  HENT:     Head: Normocephalic and atraumatic.  Eyes:     Pupils: Pupils are equal, round, and reactive to light.  Cardiovascular:     Rate and Rhythm: Normal rate and regular rhythm.     Heart sounds: Normal heart sounds.  Pulmonary:     Effort: Pulmonary effort is normal.     Breath sounds: Normal breath sounds.  Abdominal:     General: Bowel sounds are normal.     Palpations: Abdomen is soft.     Comments: Abdominal exam is soft.  She has good bowel sounds.  She has well-healed laparoscopy scar in the midline.  She has had no fluid wave.  There is no guarding or rebound tenderness.  There is no palpable abdominal mass.  Is no palpable liver or spleen tip.  Musculoskeletal:        General: No tenderness or deformity. Normal range of motion.     Cervical back: Normal range of motion.  Lymphadenopathy:     Cervical: No cervical adenopathy.  Skin:    General: Skin is warm and dry.     Findings: No erythema or rash.  Neurological:     Mental Status: She is alert and oriented to person, place, and time.  Psychiatric:        Behavior: Behavior normal.        Thought Content: Thought content normal.        Judgment: Judgment normal.      Lab Results  Component Value Date   WBC 7.3 01/23/2023   HGB 13.5  01/23/2023   HCT 41.8 01/23/2023   MCV 92.7 01/23/2023   PLT 251 01/23/2023     Chemistry      Component Value Date/Time   NA 145 01/23/2023 1024   NA 142 05/21/2018 0000   K 4.1 01/23/2023 1024   CL 107 01/23/2023 1024   CO2 30 01/23/2023 1024   BUN 18 01/23/2023 1024   BUN 19 05/21/2018 0000   CREATININE 0.72 01/23/2023 1024   GLU 101 05/21/2018 0000      Component Value Date/Time   CALCIUM 9.3 01/23/2023 1024   ALKPHOS 58 01/23/2023 1024   AST 13 (L) 01/23/2023 1024   ALT 7 01/23/2023 1024   BILITOT 0.7 01/23/2023 1024      Impression and Plan: Kristen Stephenson is a very nice 76 year old white female.  She has a very interesting history.  She has low-grade mucinous neoplasm of the appendix.  It was not metastatic.  It was locally advanced.  There did not appear to be any adenopathy.  She underwent an incredibly aggressive resection followed by the intraperitoneal chemotherapy with mitomycin C.  There is certainly was a very aggressive way of treating this.  She was in apparently very good shape to be able to have this type of treatment.  It has now been 6 years.  I think that everything looks fine.  I do not think we have to do any scans on her.  I still think we have to follow her yearly.  I am absolutely impressed with her athletic vigor.  She is incredibly active.  I give her a total credit for this.  Josph Macho, MD 10/16/202411:43 AM

## 2023-02-06 ENCOUNTER — Encounter: Payer: Self-pay | Admitting: Gastroenterology

## 2023-02-12 ENCOUNTER — Encounter: Payer: Self-pay | Admitting: Family Medicine

## 2023-02-15 IMAGING — MG MM DIGITAL SCREENING BILAT W/ TOMO AND CAD
8 series · 8 of 24 positions shown · non-contrast
Comparison: Previous exam(s).

CLINICAL DATA: Screening.

EXAM:
DIGITAL SCREENING BILATERAL MAMMOGRAM WITH TOMOSYNTHESIS AND CAD
TECHNIQUE: Bilateral screening digital craniocaudal and mediolateral oblique
mammograms were obtained. Bilateral screening digital breast
tomosynthesis was performed. The images were evaluated with
computer-aided detection.

[R MLO synth-2D]
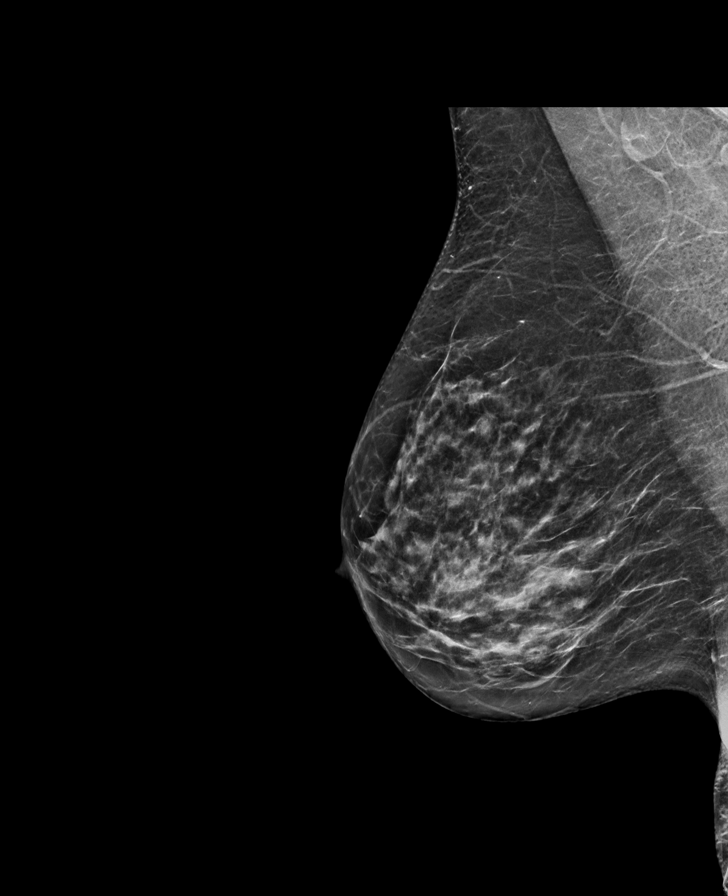

[L CC synth-2D]
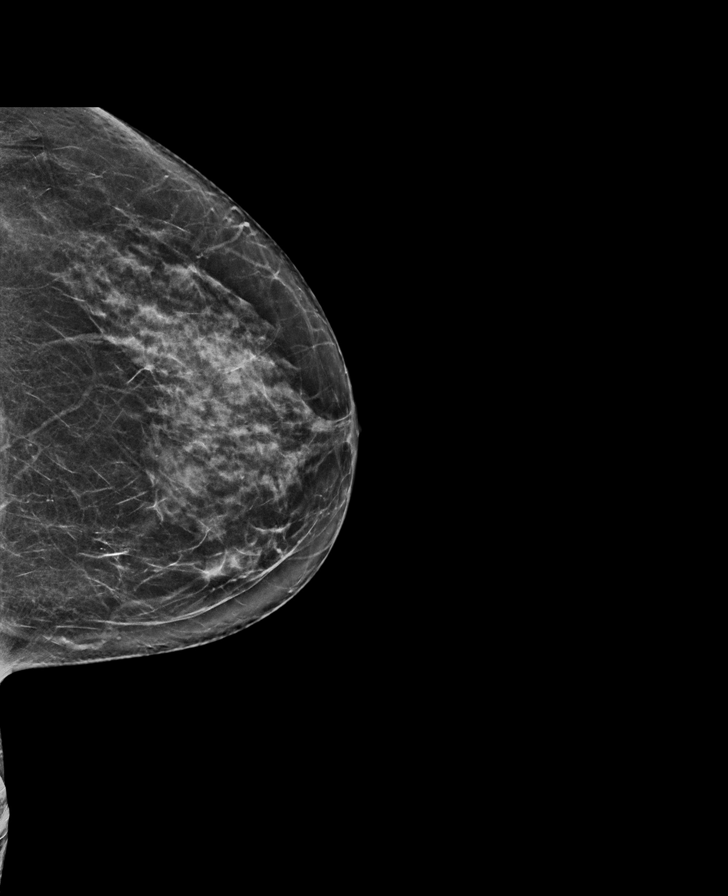

[L MLO synth-2D]
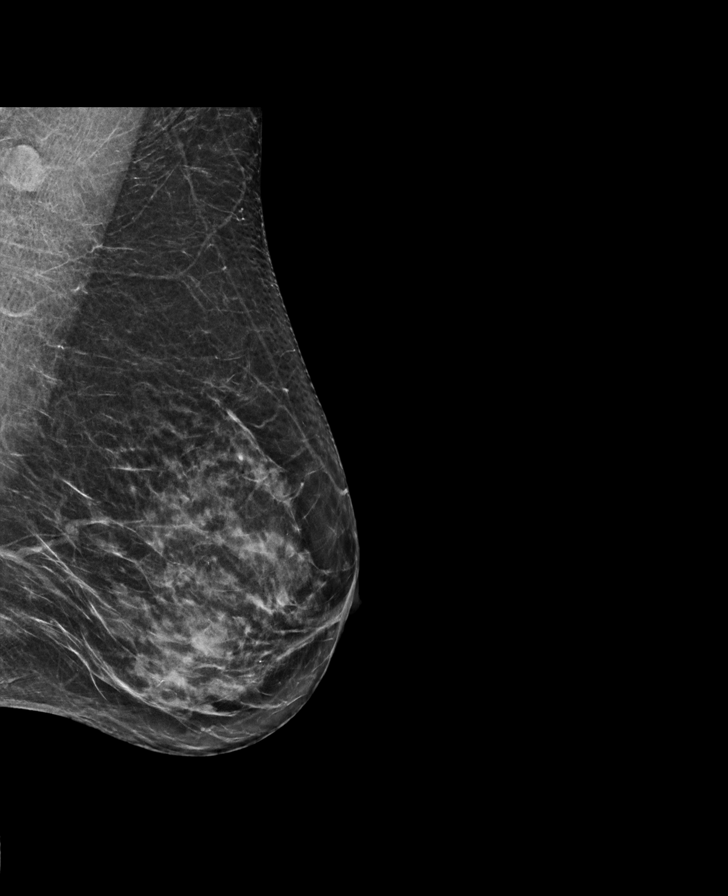

[R CC synth-2D]
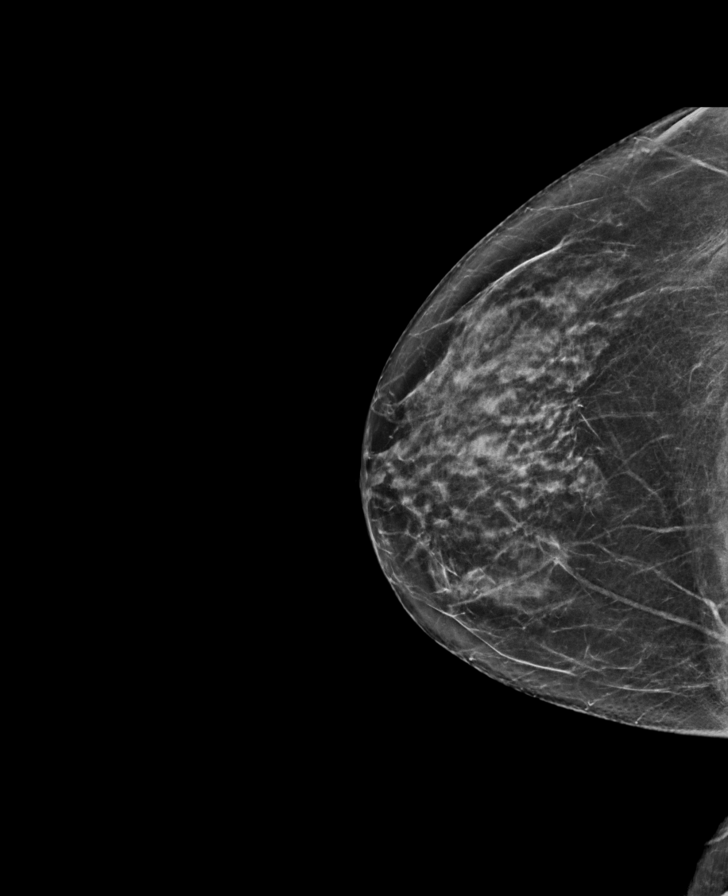

[L MLO tomo · tomo slice 33/65.0]
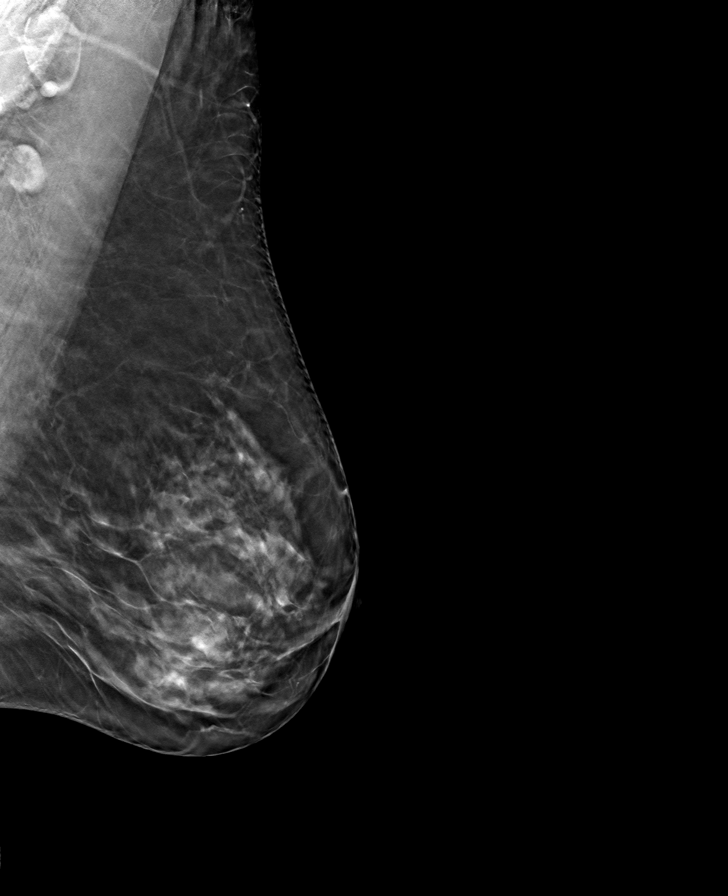

[R MLO tomo · tomo slice 33/64.0]
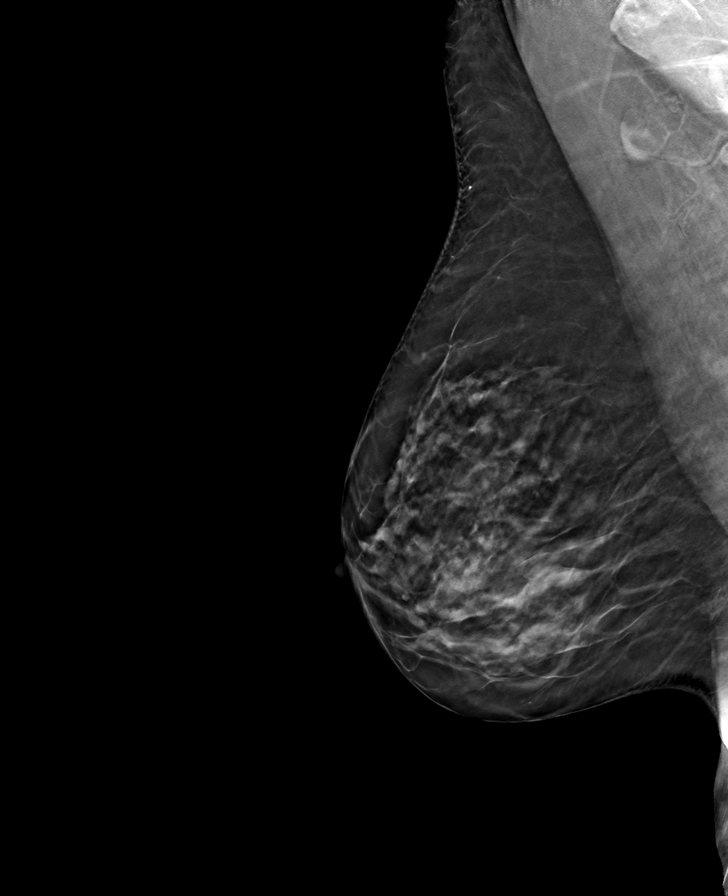

[L CC tomo · tomo slice 32/63.0]
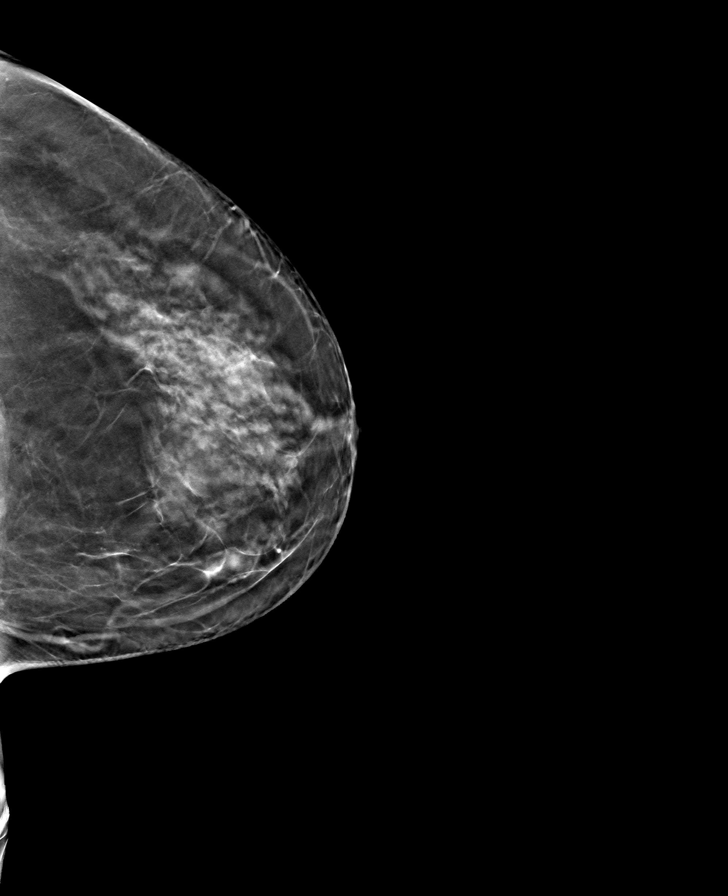

[R CC tomo · tomo slice 31/61.0]
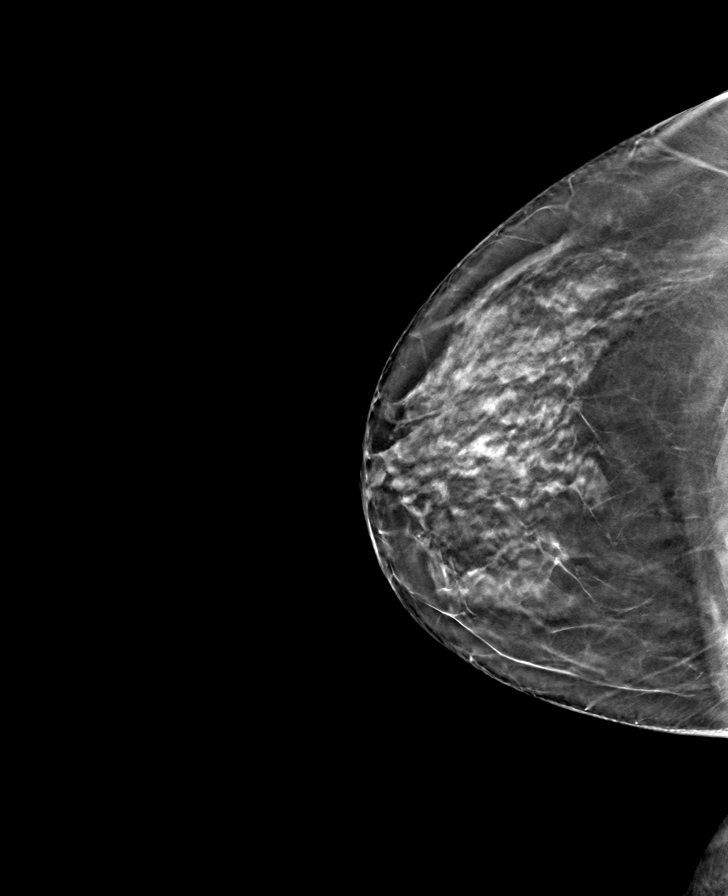

[8 of 24 positions shown; findings below may reference images not displayed]

ACR Breast Density Category c: The breast tissue is heterogeneously
dense, which may obscure small masses.
FINDINGS: There are no findings suspicious for malignancy.
IMPRESSION: No mammographic evidence of malignancy. A result letter of this
screening mammogram will be mailed directly to the patient.

RECOMMENDATION:
Screening mammogram in one year. (Code:[id number]-BC3)

BI-RADS CATEGORY  1: Negative.

## 2023-03-13 DIAGNOSIS — M7741 Metatarsalgia, right foot: Secondary | ICD-10-CM | POA: Diagnosis not present

## 2023-03-13 DIAGNOSIS — M2041 Other hammer toe(s) (acquired), right foot: Secondary | ICD-10-CM | POA: Diagnosis not present

## 2023-03-13 DIAGNOSIS — M2021 Hallux rigidus, right foot: Secondary | ICD-10-CM | POA: Diagnosis not present

## 2023-03-13 DIAGNOSIS — M2011 Hallux valgus (acquired), right foot: Secondary | ICD-10-CM | POA: Diagnosis not present

## 2023-03-13 DIAGNOSIS — S92351S Displaced fracture of fifth metatarsal bone, right foot, sequela: Secondary | ICD-10-CM | POA: Diagnosis not present

## 2023-03-14 ENCOUNTER — Other Ambulatory Visit (HOSPITAL_COMMUNITY): Payer: Self-pay | Admitting: Orthopedic Surgery

## 2023-03-18 ENCOUNTER — Ambulatory Visit
Admission: RE | Admit: 2023-03-18 | Discharge: 2023-03-18 | Disposition: A | Discharge status: Home / Self Care | Payer: PPO | Source: Ambulatory Visit | Attending: Family Medicine | Admitting: Family Medicine

## 2023-03-18 DIAGNOSIS — Z1231 Encounter for screening mammogram for malignant neoplasm of breast: Secondary | ICD-10-CM | POA: Diagnosis not present

## 2023-04-21 ENCOUNTER — Other Ambulatory Visit: Payer: Self-pay | Admitting: Family Medicine

## 2023-05-06 ENCOUNTER — Telehealth: Payer: Self-pay

## 2023-05-06 NOTE — Telephone Encounter (Signed)
Copied from CRM [id number]. Topic: Clinical - Medical Advice >> May 06, 2023 12:54 PM Alona Bene A wrote: Reason for CRM: Patient called in wanting to know if she needs to get updated flu shot before her next surgery. Please contact patient and advise. Patient can be reached at [phone].  Please Advise on the Flu shot

## 2023-05-06 NOTE — Telephone Encounter (Signed)
Spoke with pt to advise her that she would not be due for another flu vaccine until Aug/Sept of this year. Pt understood

## 2023-05-08 ENCOUNTER — Other Ambulatory Visit: Payer: Self-pay

## 2023-05-08 ENCOUNTER — Encounter (HOSPITAL_BASED_OUTPATIENT_CLINIC_OR_DEPARTMENT_OTHER): Payer: Self-pay | Admitting: Orthopedic Surgery

## 2023-05-13 ENCOUNTER — Encounter: Payer: PPO | Admitting: Family Medicine

## 2023-05-14 ENCOUNTER — Encounter: Payer: Self-pay | Admitting: Family

## 2023-05-14 ENCOUNTER — Ambulatory Visit (INDEPENDENT_AMBULATORY_CARE_PROVIDER_SITE_OTHER): Payer: PPO | Admitting: Family

## 2023-05-14 VITALS — BP 94/65 | HR 91 | Temp 102.2°F | Ht 64.0 in | Wt 132.0 lb

## 2023-05-14 DIAGNOSIS — J101 Influenza due to other identified influenza virus with other respiratory manifestations: Secondary | ICD-10-CM | POA: Diagnosis not present

## 2023-05-14 LAB — POCT INFLUENZA A/B
Influenza A, POC: POSITIVE — AB
Influenza B, POC: NEGATIVE

## 2023-05-14 LAB — POC COVID19 BINAXNOW: SARS Coronavirus 2 Ag: NEGATIVE

## 2023-05-14 MED ORDER — OSELTAMIVIR PHOSPHATE 75 MG PO CAPS: 75.0000 mg | ORAL_CAPSULE | Freq: Two times a day (BID) | ORAL | 0 refills | Status: DC

## 2023-05-14 NOTE — Progress Notes (Signed)
 Patient ID: Kristen Stephenson, female    DOB: 1950/07/12, 73 y.o.   MRN: [id number]  Chief Complaint  Patient presents with   Sinus Problem    Cough, chills, fever, Chest congestion and runny nose. Present since Monday night.        Discussed the use of AI scribe software for clinical note transcription with the patient, who gave verbal consent to proceed.  History of Present Illness   Kristen Stephenson is a 73 year old female who presents with flu symptoms. She developed flu symptoms after returning from a trip to [place]  2 days ago. Initially, she had mild rhinorrhea on Friday, which progressed to ear and head congestion by Sunday night. She was planning to have foot surgery on Thursday, which influenced her decision not to take any medications initially. She is now considering over-the-counter remedies such as antihistamines.  She is unsure if she received a flu shot this year, but it is confirmed that she did receive it in August.       Assessment & Plan:     Influenza A - Positive flu test with symptoms starting yesterday. No history of antiviral treatment. -Start Tamiflu , twice a day for five days. -Continue over-the-counter remedies as needed for symptom relief. -Recommend generic Advil or Aleve to help with body aches, fever, sore throat or sinus pressure. -Recommend nasal saline spray 3-4x/day to help disinfect sinuses.     Subjective:    Outpatient Medications Prior to Visit  Medication Sig Dispense Refill   Calcium Carb-Cholecalciferol (CALCIUM 600 + D PO) Take 1 tablet by mouth daily.     Cholecalciferol (VITAMIN D3) 50 MCG (2000 UT) CAPS Take 2,000 Units by mouth daily.     cyanocobalamin  (VITAMIN B12) 1000 MCG tablet Take 1,000 mcg by mouth daily.     pantoprazole  (PROTONIX ) 40 MG tablet Take 1 tablet (40 mg total) by mouth daily. 90 tablet 3   simvastatin  (ZOCOR ) 20 MG tablet TAKE 1 TABLET (20 MG TOTAL) BY MOUTH AT BEDTIME. 90 tablet 3   No  facility-administered medications prior to visit.   Past Medical History:  Diagnosis Date   Adjustment disorder with anxiety    Arthritis    feet, back   Barrett's esophagus    [facility] Provider Based Lic Grp-Needs Endoscopy in 2023   Cancer [facility])    cervix   Cataract    beginning   GERD (gastroesophageal reflux disease)    H/O ovarian cancer    Heart murmur    History of malignant neoplasm of appendix    Hyperlipidemia    Hyperlipidemia    Lung nodule    Osteopenia    Osteopenia after menopause 09/01/2019   dexa 08/2019   Ovarian cancer (HCC) 2018   Past Surgical History:  Procedure Laterality Date   ABDOMINAL HYSTERECTOMY     ANKLE SURGERY  2003   APPENDECTOMY  2018   Cancer/S/P resection and chemo   ARTHROSCOPIC REPAIR ACL Left    BREAST EXCISIONAL BIOPSY     CYST EXCISION Right 1972   Right breast/benign   FEMUR FRACTURE SURGERY Right    Plate with 5 screws   INGUINAL HERNIA REPAIR  11/2007   TONSILLECTOMY     TOTAL KNEE ARTHROPLASTY Left 06/12/2022   Procedure: LEFT TOTAL KNEE ARTHROPLASTY;  Surgeon: Vernetta Lonni GRADE, MD;  Location: [facility] OR;  Service: Orthopedics;  Laterality: Left;   VULVAR LESION REMOVAL  07/2007   benign   No Known Allergies  Objective:    Physical Exam Vitals and nursing note reviewed.  Constitutional:      Appearance: Normal appearance. She is ill-appearing.     Interventions: Face mask in place.  HENT:     Right Ear: Tympanic membrane and ear canal normal.     Left Ear: Tympanic membrane and ear canal normal.     Nose:     Right Sinus: Frontal sinus tenderness present.     Left Sinus: Frontal sinus tenderness present.     Mouth/Throat:     Mouth: Mucous membranes are moist.     Pharynx: Posterior oropharyngeal erythema present. No pharyngeal swelling, oropharyngeal exudate or uvula swelling.     Tonsils: No tonsillar exudate or tonsillar abscesses.  Cardiovascular:     Rate and Rhythm: Normal rate and regular rhythm.   Pulmonary:     Effort: Pulmonary effort is normal.     Breath sounds: Normal breath sounds.  Musculoskeletal:        General: Normal range of motion.  Lymphadenopathy:     Head:     Right side of head: No preauricular or posterior auricular adenopathy.     Left side of head: No preauricular or posterior auricular adenopathy.     Cervical: No cervical adenopathy.  Skin:    General: Skin is warm and dry.  Neurological:     Mental Status: She is alert.  Psychiatric:        Mood and Affect: Mood normal.        Behavior: Behavior normal.    BP 94/65 (BP Location: Left Arm, Patient Position: Sitting, Cuff Size: Normal)   Pulse 91   Temp (!) 102.2 F (39 C) (Temporal)   Ht 5' 4 (1.626 m)   Wt 132 lb (59.9 kg)   SpO2 92%   BMI 22.66 kg/m  Wt Readings from Last 3 Encounters:  05/14/23 132 lb (59.9 kg)  01/23/23 134 lb (60.8 kg)  01/14/23 135 lb (61.2 kg)      Kristen Krabbe, NP

## 2023-05-14 NOTE — Patient Instructions (Addendum)
 It was very nice to see you today!   I have sent over the Tamiflu  to your pharmacy. You can continue to take over the counter cough or sinus medications to help your symptoms with the Tamiflu . Also recommend taking generic Advil (up to 3 tablets) 3 times per day or Aleve (up to 2 tablets) twice a day to help with body aches, fever, sore throat or sinus pressure. You can use saline nasal spray to help disinfect your sinuses and provide moisture. Drink plenty of water!       PLEASE NOTE:  If you had any lab tests please let us  know if you have not heard back within a few days. You may see your results on MyChart before we have a chance to review them but we will give you a call once they are reviewed by us . If we ordered any referrals today, please let us  know if you have not heard from their office within the next week.

## 2023-05-16 DIAGNOSIS — Z01818 Encounter for other preprocedural examination: Secondary | ICD-10-CM

## 2023-05-24 ENCOUNTER — Telehealth: Payer: PPO | Admitting: Physician Assistant

## 2023-05-24 DIAGNOSIS — J208 Acute bronchitis due to other specified organisms: Secondary | ICD-10-CM

## 2023-05-24 DIAGNOSIS — B9689 Other specified bacterial agents as the cause of diseases classified elsewhere: Secondary | ICD-10-CM

## 2023-05-24 MED ORDER — ALBUTEROL SULFATE HFA 108 (90 BASE) MCG/ACT IN AERS: 1.0000 | INHALATION_SPRAY | Freq: Four times a day (QID) | RESPIRATORY_TRACT | 0 refills | Status: DC | PRN

## 2023-05-24 MED ORDER — AZITHROMYCIN 250 MG PO TABS: ORAL_TABLET | ORAL | 0 refills | Status: AC

## 2023-05-24 MED ORDER — PROMETHAZINE-DM 6.25-15 MG/5ML PO SYRP: 5.0000 mL | ORAL_SOLUTION | Freq: Four times a day (QID) | ORAL | 0 refills | Status: DC | PRN

## 2023-05-24 MED ORDER — BENZONATATE 100 MG PO CAPS
100.0000 mg | ORAL_CAPSULE | Freq: Three times a day (TID) | ORAL | 0 refills | Status: DC | PRN
Start: 2023-05-24 — End: 2023-06-19

## 2023-05-24 NOTE — Patient Instructions (Signed)
Kristen Stephenson, thank you for joining Kristen Loveless, PA-C for today's virtual visit.  While this provider is not your primary care provider (PCP), if your PCP is located in our provider database this encounter information will be shared with them immediately following your visit.   A [facility] MyChart account gives you access to today's visit and all your visits, tests, and labs performed at [facility] " click here if you don't have a [facility] MyChart account or go to [email or web address]  Consent: (Patient) Kristen Stephenson provided verbal consent for this virtual visit at the beginning of the encounter.  Current Medications:  Current Outpatient Medications:    albuterol (VENTOLIN HFA) 108 (90 Base) MCG/ACT inhaler, Inhale 1-2 puffs into the lungs every 6 (six) hours as needed., Disp: 8 g, Rfl: 0   azithromycin (ZITHROMAX) 250 MG tablet, Take 2 tablets on day 1, then 1 tablet daily on days 2 through 5, Disp: 6 tablet, Rfl: 0   benzonatate (TESSALON) 100 MG capsule, Take 1-2 capsules (100-200 mg total) by mouth 3 (three) times daily as needed., Disp: 30 capsule, Rfl: 0   promethazine-dextromethorphan (PROMETHAZINE-DM) 6.25-15 MG/5ML syrup, Take 5 mLs by mouth 4 (four) times daily as needed., Disp: 118 mL, Rfl: 0   Calcium Carb-Cholecalciferol (CALCIUM 600 + D PO), Take 1 tablet by mouth daily., Disp: , Rfl:    Cholecalciferol (VITAMIN D3) 50 MCG (2000 UT) CAPS, Take 2,000 Units by mouth daily., Disp: , Rfl:    cyanocobalamin (VITAMIN B12) 1000 MCG tablet, Take 1,000 mcg by mouth daily., Disp: , Rfl:    oseltamivir (TAMIFLU) 75 MG capsule, Take 1 capsule (75 mg total) by mouth 2 (two) times daily., Disp: 10 capsule, Rfl: 0   pantoprazole (PROTONIX) 40 MG tablet, Take 1 tablet (40 mg total) by mouth daily., Disp: 90 tablet, Rfl: 3   simvastatin (ZOCOR) 20 MG tablet, TAKE 1 TABLET (20 MG TOTAL) BY MOUTH AT BEDTIME., Disp: 90 tablet, Rfl: 3   Medications ordered  in this encounter:  Meds ordered this encounter  Medications   azithromycin (ZITHROMAX) 250 MG tablet    Sig: Take 2 tablets on day 1, then 1 tablet daily on days 2 through 5    Dispense:  6 tablet    Refill:  0    Supervising Provider:   Merrilee Jansky [[id number]]   promethazine-dextromethorphan (PROMETHAZINE-DM) 6.25-15 MG/5ML syrup    Sig: Take 5 mLs by mouth 4 (four) times daily as needed.    Dispense:  118 mL    Refill:  0    Supervising Provider:   Merrilee Jansky [[id number]]   albuterol (VENTOLIN HFA) 108 (90 Base) MCG/ACT inhaler    Sig: Inhale 1-2 puffs into the lungs every 6 (six) hours as needed.    Dispense:  8 g    Refill:  0    Supervising Provider:   Merrilee Jansky [[id number]]   benzonatate (TESSALON) 100 MG capsule    Sig: Take 1-2 capsules (100-200 mg total) by mouth 3 (three) times daily as needed.    Dispense:  30 capsule    Refill:  0    Supervising Provider:   Merrilee Jansky [[id number]]     *If you need refills on other medications prior to your next appointment, please contact your pharmacy*  Follow-Up: Call back or seek an in-person evaluation if the symptoms worsen or if the condition fails to improve as anticipated.  [facility] [phone]  Other Instructions Acute Bronchitis, Adult  Acute bronchitis is sudden inflammation of the main airways (bronchi) that come off the windpipe (trachea) in the lungs. The swelling causes the airways to get smaller and make more mucus than normal. This can make it hard to breathe and can cause coughing or noisy breathing (wheezing). Acute bronchitis may last several weeks. The cough may last longer. Allergies, asthma, and exposure to smoke may make the condition worse. What are the causes? This condition can be caused by germs and by substances that irritate the lungs, including: Cold and flu viruses. The most common cause of this condition is the virus that causes the common cold. Bacteria. This is  less common. Breathing in substances that irritate the lungs, including: Smoke from cigarettes and other forms of tobacco. Dust and pollen. Fumes from household cleaning products, gases, or burned fuel. Indoor or outdoor air pollution. What increases the risk? The following factors may make you more likely to develop this condition: A weak body's defense system, also called the immune system. A condition that affects your lungs and breathing, such as asthma. What are the signs or symptoms? Common symptoms of this condition include: Coughing. This may bring up clear, yellow, or green mucus from your lungs (sputum). Wheezing. Runny or stuffy nose. Having too much mucus in your lungs (chest congestion). Shortness of breath. Aches and pains, including sore throat or chest. How is this diagnosed? This condition is usually diagnosed based on: Your symptoms and medical history. A physical exam. You may also have other tests, including tests to rule out other conditions, such as pneumonia. These tests include: A test of lung function. Test of a mucus sample to look for the presence of bacteria. Tests to check the oxygen level in your blood. Blood tests. Chest X-ray. How is this treated? Most cases of acute bronchitis clear up over time without treatment. Your health care provider may recommend: Drinking more fluids to help thin your mucus so it is easier to cough up. Taking inhaled medicine (inhaler) to improve air flow in and out of your lungs. Using a vaporizer or a humidifier. These are machines that add water to the air to help you breathe better. Taking a medicine that thins mucus and clears congestion (expectorant). Taking a medicine that prevents or stops coughing (cough suppressant). It is not common to take an antibiotic medicine for this condition. Follow these instructions at home:  Take over-the-counter and prescription medicines only as told by your health care  provider. Use an inhaler, vaporizer, or humidifier as told by your health care provider. Take two teaspoons (10 mL) of honey at bedtime to lessen coughing at night. Drink enough fluid to keep your urine pale yellow. Do not use any products that contain nicotine or tobacco. These products include cigarettes, chewing tobacco, and vaping devices, such as e-cigarettes. If you need help quitting, ask your health care provider. Get plenty of rest. Return to your normal activities as told by your health care provider. Ask your health care provider what activities are safe for you. Keep all follow-up visits. This is important. How is this prevented? To lower your risk of getting this condition again: Wash your hands often with soap and water for at least 20 seconds. If soap and water are not available, use hand sanitizer. Avoid contact with people who have cold symptoms. Try not to touch your mouth, nose, or eyes with your hands. Avoid breathing in smoke or chemical fumes. Breathing smoke or chemical  fumes will make your condition worse. Get the flu shot every year. Contact a health care provider if: Your symptoms do not improve after 2 weeks. You have trouble coughing up the mucus. Your cough keeps you awake at night. You have a fever. Get help right away if you: Cough up blood. Feel pain in your chest. Have severe shortness of breath. Faint or keep feeling like you are going to faint. Have a severe headache. Have a fever or chills that get worse. These symptoms may represent a serious problem that is an emergency. Do not wait to see if the symptoms will go away. Get medical help right away. Call your local emergency services (911 in the [place] Do not drive yourself to the hospital. Summary Acute bronchitis is inflammation of the main airways (bronchi) that come off the windpipe (trachea) in the lungs. The swelling causes the airways to get smaller and make more mucus than normal. Drinking  more fluids can help thin your mucus so it is easier to cough up. Take over-the-counter and prescription medicines only as told by your health care provider. Do not use any products that contain nicotine or tobacco. These products include cigarettes, chewing tobacco, and vaping devices, such as e-cigarettes. If you need help quitting, ask your health care provider. Contact a health care provider if your symptoms do not improve after 2 weeks. This information is not intended to replace advice given to you by your health care provider. Make sure you discuss any questions you have with your health care provider. Document Revised: 07/06/2021 Document Reviewed: 07/27/2020 Elsevier Patient Education  2024 [employer].   If you have been instructed to have an in-person evaluation today at a local Urgent Care facility, please use the link below. It will take you to a list of all of our available [facility], including address, phone number and hours of operation. Please do not delay care.  [facility]  If you or a family member do not have a primary care provider, use the link below to schedule a visit and establish care. When you choose a [facility] primary care physician or advanced practice provider, you gain a long-term partner in health. Find a Primary Care Provider  Learn more about [facility]'s in-office and virtual care options: [facility] - Get Care Now

## 2023-05-24 NOTE — Progress Notes (Signed)
Virtual Visit Consent   Kristen Stephenson, you are scheduled for a virtual visit with a [facility] provider today. Just as with appointments in the office, your consent must be obtained to participate. Your consent will be active for this visit and any virtual visit you may have with one of our providers in the next 365 days. If you have a MyChart account, a copy of this consent can be sent to you electronically.  As this is a virtual visit, video technology does not allow for your provider to perform a traditional examination. This may limit your provider's ability to fully assess your condition. If your provider identifies any concerns that need to be evaluated in person or the need to arrange testing (such as labs, EKG, etc.), we will make arrangements to do so. Although advances in technology are sophisticated, we cannot ensure that it will always work on either your end or our end. If the connection with a video visit is poor, the visit may have to be switched to a telephone visit. With either a video or telephone visit, we are not always able to ensure that we have a secure connection.  By engaging in this virtual visit, you consent to the provision of healthcare and authorize for your insurance to be billed (if applicable) for the services provided during this visit. Depending on your insurance coverage, you may receive a charge related to this service.  I need to obtain your verbal consent now. Are you willing to proceed with your visit today? Kristen Stephenson has provided verbal consent on 05/24/2023 for a virtual visit (video or telephone). Kristen Loveless, PA-C  Date: 05/24/2023 9:54 AM   Virtual Visit via Video Note   I, Kristen Stephenson, connected with  Kristen Stephenson  ([id number], May 27, 1950) on 05/24/23 at  9:45 AM EST by a video-enabled telemedicine application and verified that I am speaking with the correct person using two identifiers.  Location: Patient: Virtual  Visit Location Patient: Home Provider: Virtual Visit Location Provider: Home Office   I discussed the limitations of evaluation and management by telemedicine and the availability of in person appointments. The patient expressed understanding and agreed to proceed.    History of Present Illness: Kristen Stephenson is a 73 y.o. who identifies as a female who was assigned female at birth, and is being seen today for deep cough.  HPI: Cough This is a new problem. The current episode started 1 to 4 weeks ago (over 10 days; seen in person at PCP office on 05/14/23 and diagnosed with Influenza. Given Tamiflu and completed, but cough progresses). The problem has been gradually worsening. The problem occurs every few minutes. The cough is Productive of sputum and productive of purulent sputum (deep). Associated symptoms include chest pain (tightness), postnasal drip, rhinorrhea, a sore throat (from cough) and wheezing (worse at night). Pertinent negatives include no chills, ear congestion, ear pain, fever, headaches, myalgias, nasal congestion, shortness of breath or sweats. The symptoms are aggravated by lying down. Treatments tried: tamiflu, generic cold and flu, promethazine DM, aleve, cough drops. The treatment provided no relief.     Problems:  Patient Active Problem List   Diagnosis Date Noted   Status post total left knee replacement 06/12/2022   Vitamin B12 deficiency 05/14/2022   Long-term current use of proton pump inhibitor therapy 05/03/2022   Lumbar and sacral spondyloarthritis 05/03/2022   Barrett's esophagus without dysplasia 04/19/2021   Postcoital UTI - recurrent 02/03/2020  Pulmonary nodules 09/23/2019   Presbycusis of both ears 09/23/2019   Osteopenia after menopause 09/01/2019   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   Anxiety 09/16/2018   History of ovarian cancer 09/16/2018   Mixed hyperlipidemia 09/16/2018   GERD (gastroesophageal reflux disease) 09/16/2018     Allergies: No Known Allergies Medications:  Current Outpatient Medications:    albuterol (VENTOLIN HFA) 108 (90 Base) MCG/ACT inhaler, Inhale 1-2 puffs into the lungs every 6 (six) hours as needed., Disp: 8 g, Rfl: 0   azithromycin (ZITHROMAX) 250 MG tablet, Take 2 tablets on day 1, then 1 tablet daily on days 2 through 5, Disp: 6 tablet, Rfl: 0   benzonatate (TESSALON) 100 MG capsule, Take 1-2 capsules (100-200 mg total) by mouth 3 (three) times daily as needed., Disp: 30 capsule, Rfl: 0   promethazine-dextromethorphan (PROMETHAZINE-DM) 6.25-15 MG/5ML syrup, Take 5 mLs by mouth 4 (four) times daily as needed., Disp: 118 mL, Rfl: 0   Calcium Carb-Cholecalciferol (CALCIUM 600 + D PO), Take 1 tablet by mouth daily., Disp: , Rfl:    Cholecalciferol (VITAMIN D3) 50 MCG (2000 UT) CAPS, Take 2,000 Units by mouth daily., Disp: , Rfl:    cyanocobalamin (VITAMIN B12) 1000 MCG tablet, Take 1,000 mcg by mouth daily., Disp: , Rfl:    oseltamivir (TAMIFLU) 75 MG capsule, Take 1 capsule (75 mg total) by mouth 2 (two) times daily., Disp: 10 capsule, Rfl: 0   pantoprazole (PROTONIX) 40 MG tablet, Take 1 tablet (40 mg total) by mouth daily., Disp: 90 tablet, Rfl: 3   simvastatin (ZOCOR) 20 MG tablet, TAKE 1 TABLET (20 MG TOTAL) BY MOUTH AT BEDTIME., Disp: 90 tablet, Rfl: 3  Observations/Objective: Patient is well-developed, well-nourished in no acute distress.  Resting comfortably at home.  Head is normocephalic, atraumatic.  No labored breathing.  Speech is clear and coherent with logical content.  Patient is alert and oriented at baseline.    Assessment and Plan: 1. Acute bacterial bronchitis (Primary) - azithromycin (ZITHROMAX) 250 MG tablet; Take 2 tablets on day 1, then 1 tablet daily on days 2 through 5  Dispense: 6 tablet; Refill: 0 - promethazine-dextromethorphan (PROMETHAZINE-DM) 6.25-15 MG/5ML syrup; Take 5 mLs by mouth 4 (four) times daily as needed.  Dispense: 118 mL; Refill: 0 - albuterol  (VENTOLIN HFA) 108 (90 Base) MCG/ACT inhaler; Inhale 1-2 puffs into the lungs every 6 (six) hours as needed.  Dispense: 8 g; Refill: 0 - benzonatate (TESSALON) 100 MG capsule; Take 1-2 capsules (100-200 mg total) by mouth 3 (three) times daily as needed.  Dispense: 30 capsule; Refill: 0  - Worsening over a week despite OTC medications - Will treat with Z-pack, Albuterol, Promethazine DM and tessalon perles - Can continue Mucinex (PLAIN) - Push fluids.  - Rest.  - Steam and humidifier can help - Seek in person evaluation if worsening or symptoms fail to improve    Follow Up Instructions: I discussed the assessment and treatment plan with the patient. The patient was provided an opportunity to ask questions and all were answered. The patient agreed with the plan and demonstrated an understanding of the instructions.  A copy of instructions were sent to the patient via MyChart unless otherwise noted below.    The patient was advised to call back or seek an in-person evaluation if the symptoms worsen or if the condition fails to improve as anticipated.    Kristen Loveless, PA-C

## 2023-06-10 ENCOUNTER — Other Ambulatory Visit (INDEPENDENT_AMBULATORY_CARE_PROVIDER_SITE_OTHER): Payer: Self-pay

## 2023-06-10 ENCOUNTER — Ambulatory Visit: Payer: PPO | Admitting: Orthopaedic Surgery

## 2023-06-10 ENCOUNTER — Encounter: Payer: Self-pay | Admitting: Orthopaedic Surgery

## 2023-06-10 DIAGNOSIS — Z96652 Presence of left artificial knee joint: Secondary | ICD-10-CM

## 2023-06-10 NOTE — Progress Notes (Signed)
 The patient is now a year status post a left total knee replacement.  She actually turns 73 later this week.  She is back to hiking and rarely uses poles when she walks.  She does have some type of MTP fusion coming up by Dr. Victorino Dike later this month and I think this can help her quite a bit as well.  Her left knee has full range of motion.  She is ligamentously stable.  There is no swelling.  It has good alignment when she stands.  An AP and lateral standing of the left knee shows a well-seated total knee arthroplasty with no complicating features.  At this point she should continue to work on quad strengthening exercises but otherwise has no restrictions from my standpoint as a relates to her left knee.  If she does develop issues with that knee she knows to reach out and let us know.  All questions and concerns were addressed and answered.

## 2023-06-19 ENCOUNTER — Encounter (HOSPITAL_BASED_OUTPATIENT_CLINIC_OR_DEPARTMENT_OTHER): Payer: Self-pay | Admitting: Orthopedic Surgery

## 2023-06-20 DIAGNOSIS — Z01818 Encounter for other preprocedural examination: Secondary | ICD-10-CM

## 2023-06-26 NOTE — Anesthesia Preprocedure Evaluation (Signed)
 Anesthesia Evaluation  Patient identified by MRN, date of birth, ID band Patient awake    Reviewed: Allergy & Precautions, NPO status , Patient's Chart, lab work & pertinent test results  Airway Mallampati: II  TM Distance: >3 FB Neck ROM: Full    Dental no notable dental hx. (+) Teeth Intact, Dental Advisory Given   Pulmonary former smoker   Pulmonary exam normal breath sounds clear to auscultation       Cardiovascular (-) hypertension(-) angina (-) Past MI Normal cardiovascular exam Rhythm:Regular Rate:Normal     Neuro/Psych  PSYCHIATRIC DISORDERS Anxiety        GI/Hepatic ,GERD  Medicated,,  Endo/Other    Renal/GU      Musculoskeletal  (+) Arthritis , Osteoarthritis,    Abdominal   Peds  Hematology   Anesthesia Other Findings   Reproductive/Obstetrics                             Anesthesia Physical Anesthesia Plan  ASA: 3  Anesthesia Plan: Regional and General   Post-op Pain Management: Regional block*, Precedex, Toradol IV (intra-op)* and Minimal or no pain anticipated   Induction:   PONV Risk Score and Plan: Propofol infusion, TIVA, Treatment may vary due to age or medical condition, Midazolam and Ondansetron  Airway Management Planned: LMA  Additional Equipment: None  Intra-op Plan:   Post-operative Plan:   Informed Consent: I have reviewed the patients History and Physical, chart, labs and discussed the procedure including the risks, benefits and alternatives for the proposed anesthesia with the patient or authorized representative who has indicated his/her understanding and acceptance.     Dental advisory given  Plan Discussed with: CRNA and Surgeon  Anesthesia Plan Comments: (R pop+  adductor LMA TIVA)       Anesthesia Quick Evaluation

## 2023-06-27 ENCOUNTER — Other Ambulatory Visit: Payer: Self-pay

## 2023-06-27 ENCOUNTER — Encounter (HOSPITAL_BASED_OUTPATIENT_CLINIC_OR_DEPARTMENT_OTHER)
Admission: RE | Disposition: A | Discharge status: Home / Self Care | Payer: Self-pay | Source: Home / Self Care | Attending: Orthopedic Surgery

## 2023-06-27 ENCOUNTER — Ambulatory Visit (HOSPITAL_BASED_OUTPATIENT_CLINIC_OR_DEPARTMENT_OTHER)
Admission: RE | Admit: 2023-06-27 | Discharge: 2023-06-27 | Disposition: A | Discharge status: Home / Self Care | Payer: PPO | Attending: Orthopedic Surgery | Admitting: Orthopedic Surgery

## 2023-06-27 ENCOUNTER — Ambulatory Visit (HOSPITAL_BASED_OUTPATIENT_CLINIC_OR_DEPARTMENT_OTHER): Payer: Self-pay | Admitting: Anesthesiology

## 2023-06-27 ENCOUNTER — Ambulatory Visit (HOSPITAL_BASED_OUTPATIENT_CLINIC_OR_DEPARTMENT_OTHER)

## 2023-06-27 ENCOUNTER — Encounter (HOSPITAL_BASED_OUTPATIENT_CLINIC_OR_DEPARTMENT_OTHER): Payer: Self-pay | Admitting: Orthopedic Surgery

## 2023-06-27 DIAGNOSIS — M8580 Other specified disorders of bone density and structure, unspecified site: Secondary | ICD-10-CM | POA: Diagnosis not present

## 2023-06-27 DIAGNOSIS — M79671 Pain in right foot: Secondary | ICD-10-CM | POA: Diagnosis present

## 2023-06-27 DIAGNOSIS — M7741 Metatarsalgia, right foot: Secondary | ICD-10-CM | POA: Diagnosis not present

## 2023-06-27 DIAGNOSIS — M24574 Contracture, right foot: Secondary | ICD-10-CM | POA: Diagnosis not present

## 2023-06-27 DIAGNOSIS — M2041 Other hammer toe(s) (acquired), right foot: Secondary | ICD-10-CM | POA: Insufficient documentation

## 2023-06-27 DIAGNOSIS — T8484XA Pain due to internal orthopedic prosthetic devices, implants and grafts, initial encounter: Secondary | ICD-10-CM

## 2023-06-27 DIAGNOSIS — M2021 Hallux rigidus, right foot: Secondary | ICD-10-CM | POA: Diagnosis not present

## 2023-06-27 DIAGNOSIS — F419 Anxiety disorder, unspecified: Secondary | ICD-10-CM

## 2023-06-27 DIAGNOSIS — M899 Disorder of bone, unspecified: Secondary | ICD-10-CM | POA: Diagnosis not present

## 2023-06-27 DIAGNOSIS — M21611 Bunion of right foot: Secondary | ICD-10-CM | POA: Diagnosis not present

## 2023-06-27 DIAGNOSIS — Z87891 Personal history of nicotine dependence: Secondary | ICD-10-CM | POA: Diagnosis not present

## 2023-06-27 DIAGNOSIS — S92351A Displaced fracture of fifth metatarsal bone, right foot, initial encounter for closed fracture: Secondary | ICD-10-CM | POA: Diagnosis not present

## 2023-06-27 DIAGNOSIS — G8918 Other acute postprocedural pain: Secondary | ICD-10-CM | POA: Diagnosis not present

## 2023-06-27 DIAGNOSIS — K219 Gastro-esophageal reflux disease without esophagitis: Secondary | ICD-10-CM | POA: Diagnosis not present

## 2023-06-27 DIAGNOSIS — M2011 Hallux valgus (acquired), right foot: Secondary | ICD-10-CM | POA: Insufficient documentation

## 2023-06-27 DIAGNOSIS — Z01818 Encounter for other preprocedural examination: Secondary | ICD-10-CM

## 2023-06-27 HISTORY — PX: ARTHRODESIS METATARSALPHALANGEAL JOINT (MTPJ): SHX6566

## 2023-06-27 HISTORY — PX: BONE EXOSTOSIS EXCISION: SHX1249

## 2023-06-27 HISTORY — PX: HAMMERTOE RECONSTRUCTION WITH WEIL OSTEOTOMY: SHX5631

## 2023-06-27 HISTORY — PX: FLEXOR TENDON REPAIR: SHX6501

## 2023-06-27 SURGERY — FUSION, JOINT, GREAT TOE
Anesthesia: Regional | Site: Toe | Laterality: Right

## 2023-06-27 MED ORDER — DOCUSATE SODIUM 100 MG PO CAPS: 100.0000 mg | ORAL_CAPSULE | Freq: Two times a day (BID) | ORAL | 0 refills | Status: DC

## 2023-06-27 MED ORDER — GLYCOPYRROLATE PF 0.2 MG/ML IJ SOSY
PREFILLED_SYRINGE | INTRAMUSCULAR | Status: AC
  Filled 2023-06-27: qty 1

## 2023-06-27 MED ORDER — PROPOFOL 10 MG/ML IV BOLUS
INTRAVENOUS | Status: DC | PRN
  Administered 2023-06-27: 175 ug/kg/min via INTRAVENOUS
  Administered 2023-06-27: 160 ug via INTRAVENOUS

## 2023-06-27 MED ORDER — ONDANSETRON HCL 4 MG/2ML IJ SOLN
INTRAMUSCULAR | Status: DC | PRN
  Administered 2023-06-27: 4 mg via INTRAVENOUS

## 2023-06-27 MED ORDER — LACTATED RINGERS IV SOLN: INTRAVENOUS | Status: DC

## 2023-06-27 MED ORDER — ACETAMINOPHEN 10 MG/ML IV SOLN: 1000.0000 mg | Freq: Once | INTRAVENOUS | Status: DC | PRN

## 2023-06-27 MED ORDER — PHENYLEPHRINE 80 MCG/ML (10ML) SYRINGE FOR IV PUSH (FOR BLOOD PRESSURE SUPPORT)
PREFILLED_SYRINGE | INTRAVENOUS | Status: AC
  Filled 2023-06-27: qty 10

## 2023-06-27 MED ORDER — FENTANYL CITRATE (PF) 100 MCG/2ML IJ SOLN
100.0000 ug | Freq: Once | INTRAMUSCULAR | Status: AC
  Administered 2023-06-27: 50 ug via INTRAVENOUS

## 2023-06-27 MED ORDER — FENTANYL CITRATE (PF) 100 MCG/2ML IJ SOLN
INTRAMUSCULAR | Status: DC | PRN
  Administered 2023-06-27 (×2): 50 ug via INTRAVENOUS

## 2023-06-27 MED ORDER — MIDAZOLAM HCL 2 MG/2ML IJ SOLN
INTRAMUSCULAR | Status: AC
  Filled 2023-06-27: qty 2

## 2023-06-27 MED ORDER — MIDAZOLAM HCL 2 MG/2ML IJ SOLN
2.0000 mg | Freq: Once | INTRAMUSCULAR | Status: AC
  Administered 2023-06-27: 1 mg via INTRAVENOUS

## 2023-06-27 MED ORDER — KETOROLAC TROMETHAMINE 30 MG/ML IJ SOLN
INTRAMUSCULAR | Status: AC
  Filled 2023-06-27: qty 1

## 2023-06-27 MED ORDER — ONDANSETRON HCL 4 MG/2ML IJ SOLN: 4.0000 mg | Freq: Once | INTRAMUSCULAR | Status: DC | PRN

## 2023-06-27 MED ORDER — LIDOCAINE 2% (20 MG/ML) 5 ML SYRINGE
INTRAMUSCULAR | Status: AC
  Filled 2023-06-27: qty 5

## 2023-06-27 MED ORDER — FENTANYL CITRATE (PF) 100 MCG/2ML IJ SOLN
INTRAMUSCULAR | Status: AC
Start: 2023-06-27 — End: ?
  Filled 2023-06-27: qty 2

## 2023-06-27 MED ORDER — FENTANYL CITRATE (PF) 100 MCG/2ML IJ SOLN: 25.0000 ug | INTRAMUSCULAR | Status: DC | PRN

## 2023-06-27 MED ORDER — CEFAZOLIN SODIUM-DEXTROSE 2-4 GM/100ML-% IV SOLN
2.0000 g | INTRAVENOUS | Status: AC
  Administered 2023-06-27: 2 g via INTRAVENOUS

## 2023-06-27 MED ORDER — DEXAMETHASONE SODIUM PHOSPHATE 10 MG/ML IJ SOLN
INTRAMUSCULAR | Status: DC | PRN
  Administered 2023-06-27: 8 mg via INTRAVENOUS

## 2023-06-27 MED ORDER — GLYCOPYRROLATE 0.2 MG/ML IJ SOLN
INTRAMUSCULAR | Status: DC | PRN
  Administered 2023-06-27: .2 mg via INTRAVENOUS

## 2023-06-27 MED ORDER — DEXAMETHASONE SODIUM PHOSPHATE 10 MG/ML IJ SOLN
INTRAMUSCULAR | Status: AC
  Filled 2023-06-27: qty 1

## 2023-06-27 MED ORDER — DEXMEDETOMIDINE HCL IN NACL 80 MCG/20ML IV SOLN
INTRAVENOUS | Status: DC | PRN
  Administered 2023-06-27: 8 ug via INTRAVENOUS

## 2023-06-27 MED ORDER — CLONIDINE HCL (ANALGESIA) 100 MCG/ML EP SOLN
EPIDURAL | Status: DC | PRN
  Administered 2023-06-27: 50 ug
  Administered 2023-06-27: 100 ug

## 2023-06-27 MED ORDER — OXYCODONE HCL 5 MG PO TABS: 5.0000 mg | ORAL_TABLET | Freq: Four times a day (QID) | ORAL | 0 refills | Status: AC | PRN

## 2023-06-27 MED ORDER — EPHEDRINE SULFATE (PRESSORS) 50 MG/ML IJ SOLN
INTRAMUSCULAR | Status: DC | PRN
  Administered 2023-06-27 (×2): 10 mg via INTRAVENOUS
  Administered 2023-06-27: 15 mg via INTRAVENOUS

## 2023-06-27 MED ORDER — EPHEDRINE 5 MG/ML INJ
INTRAVENOUS | Status: AC
Start: 2023-06-27 — End: ?
  Filled 2023-06-27: qty 10

## 2023-06-27 MED ORDER — VANCOMYCIN HCL 500 MG IV SOLR
INTRAVENOUS | Status: DC | PRN
  Administered 2023-06-27: 500 mg via TOPICAL

## 2023-06-27 MED ORDER — KETOROLAC TROMETHAMINE 30 MG/ML IJ SOLN
INTRAMUSCULAR | Status: DC | PRN
  Administered 2023-06-27: 15 mg via INTRAVENOUS

## 2023-06-27 MED ORDER — LIDOCAINE 2% (20 MG/ML) 5 ML SYRINGE
INTRAMUSCULAR | Status: DC | PRN
  Administered 2023-06-27: 100 mg via INTRAVENOUS

## 2023-06-27 MED ORDER — PHENYLEPHRINE HCL (PRESSORS) 10 MG/ML IV SOLN
INTRAVENOUS | Status: DC | PRN
  Administered 2023-06-27: 160 ug via INTRAVENOUS

## 2023-06-27 MED ORDER — LIDOCAINE 2% (20 MG/ML) 5 ML SYRINGE: INTRAMUSCULAR | Status: DC | PRN

## 2023-06-27 MED ORDER — SENNA 8.6 MG PO TABS: 2.0000 | ORAL_TABLET | Freq: Two times a day (BID) | ORAL | 0 refills | Status: DC

## 2023-06-27 MED ORDER — 0.9 % SODIUM CHLORIDE (POUR BTL) OPTIME
TOPICAL | Status: DC | PRN
  Administered 2023-06-27: 1000 mL

## 2023-06-27 MED ORDER — FENTANYL CITRATE (PF) 100 MCG/2ML IJ SOLN
INTRAMUSCULAR | Status: AC
  Filled 2023-06-27: qty 2

## 2023-06-27 MED ORDER — ROPIVACAINE HCL 5 MG/ML IJ SOLN
INTRAMUSCULAR | Status: DC | PRN
  Administered 2023-06-27: 27 mL via PERINEURAL
  Administered 2023-06-27: 15 mL via PERINEURAL

## 2023-06-27 MED ORDER — CEFAZOLIN SODIUM-DEXTROSE 2-4 GM/100ML-% IV SOLN
INTRAVENOUS | Status: AC
  Filled 2023-06-27: qty 100

## 2023-06-27 MED ORDER — ONDANSETRON HCL 4 MG/2ML IJ SOLN
INTRAMUSCULAR | Status: AC
  Filled 2023-06-27: qty 2

## 2023-06-27 SURGICAL SUPPLY — 87 items
BANDAGE ESMARK 6X9 LF (GAUZE/BANDAGES/DRESSINGS) IMPLANT
BIT DRILL 2 STRT CANN (BIT) IMPLANT
BIT DRILL 2.7XCANN QCK CNCT (BIT) IMPLANT
BIT DRILL Q-C 2.0 DIA 100 (BIT) IMPLANT
BIT DRL 2.7XCANN QCK CNCT (BIT) ×2 IMPLANT
BLADE AVERAGE 25X9 (BLADE) IMPLANT
BLADE LONG MED 25X9 (BLADE) ×2 IMPLANT
BLADE MICRO SAGITTAL (BLADE) IMPLANT
BLADE MINI RND TIP GREEN BEAV (BLADE) ×2 IMPLANT
BLADE OSC/SAG .038X5.5 CUT EDG (BLADE) IMPLANT
BLADE SURG 15 STRL LF DISP TIS (BLADE) ×4 IMPLANT
BNDG ELASTIC 4INX 5YD STR LF (GAUZE/BANDAGES/DRESSINGS) ×2 IMPLANT
BNDG ELASTIC 6INX 5YD STR LF (GAUZE/BANDAGES/DRESSINGS) IMPLANT
BNDG ESMARK 4X9 LF (GAUZE/BANDAGES/DRESSINGS) IMPLANT
BNDG ESMARK 6X9 LF (GAUZE/BANDAGES/DRESSINGS) IMPLANT
BNDG STRETCH GAUZE 3IN X12FT (GAUZE/BANDAGES/DRESSINGS) ×2 IMPLANT
BOOT STEPPER DURA LG (SOFTGOODS) IMPLANT
BOOT STEPPER DURA MED (SOFTGOODS) IMPLANT
BOOT STEPPER DURA SM (SOFTGOODS) IMPLANT
BOOT STEPPER DURA XLG (SOFTGOODS) IMPLANT
BUR MIS STRT 2.0X13 (BURR) IMPLANT
BURR MIS STRT 2.0X13 (BURR) ×2 IMPLANT
CANISTER SUCT 1200ML W/VALVE (MISCELLANEOUS) IMPLANT
CHLORAPREP W/TINT 26 (MISCELLANEOUS) ×2 IMPLANT
COVER BACK TABLE 60X90IN (DRAPES) ×2 IMPLANT
CUFF TRNQT CYL 24X4X16.5-23 (TOURNIQUET CUFF) ×2 IMPLANT
CUFF TRNQT CYL 34X4.125X (TOURNIQUET CUFF) IMPLANT
DRAPE EXTREMITY T 121X128X90 (DISPOSABLE) ×2 IMPLANT
DRAPE INCISE IOBAN 66X45 STRL (DRAPES) IMPLANT
DRAPE OEC MINIVIEW 54X84 (DRAPES) ×2 IMPLANT
DRAPE SURG 17X23 STRL (DRAPES) IMPLANT
DRAPE U-SHAPE 47X51 STRL (DRAPES) ×2 IMPLANT
DRSG MEPITEL 4X7.2 (GAUZE/BANDAGES/DRESSINGS) ×2 IMPLANT
ELECT REM PT RETURN 9FT ADLT (ELECTROSURGICAL) ×2 IMPLANT
ELECTRODE REM PT RTRN 9FT ADLT (ELECTROSURGICAL) ×2 IMPLANT
GAUZE PAD ABD 8X10 STRL (GAUZE/BANDAGES/DRESSINGS) ×2 IMPLANT
GAUZE SPONGE 4X4 12PLY STRL (GAUZE/BANDAGES/DRESSINGS) ×2 IMPLANT
GAUZE STRETCH 2X75IN STRL (MISCELLANEOUS) IMPLANT
GLOVE BIO SURGEON STRL SZ8 (GLOVE) ×2 IMPLANT
GLOVE BIOGEL PI IND STRL 8 (GLOVE) ×4 IMPLANT
GLOVE ECLIPSE 8.0 STRL XLNG CF (GLOVE) ×2 IMPLANT
GOWN STRL REUS W/ TWL LRG LVL3 (GOWN DISPOSABLE) ×2 IMPLANT
GOWN STRL REUS W/ TWL XL LVL3 (GOWN DISPOSABLE) ×4 IMPLANT
GUIDEWIRE 0.86MM (WIRE) IMPLANT
GUIDEWIRE PIN ORTH 6X1.6XSMTH (WIRE) IMPLANT
K-WIRE DBL .054X4 NSTRL (WIRE) IMPLANT
K-WIRE DBL .054X9 NSTRL (WIRE) IMPLANT
KWIRE DBL .054X4 NSTRL (WIRE) IMPLANT
KWIRE DBL .054X9 NSTRL (WIRE) IMPLANT
NDL HYPO 22X1.5 SAFETY MO (MISCELLANEOUS) ×2 IMPLANT
NDL HYPO 25X1 1.5 SAFETY (NEEDLE) IMPLANT
NEEDLE HYPO 22X1.5 SAFETY MO (MISCELLANEOUS) IMPLANT
NEEDLE HYPO 25X1 1.5 SAFETY (NEEDLE) IMPLANT
NS IRRIG 1000ML POUR BTL (IV SOLUTION) ×2 IMPLANT
PACK BASIN DAY SURGERY FS (CUSTOM PROCEDURE TRAY) ×2 IMPLANT
PAD CAST 4YDX4 CTTN HI CHSV (CAST SUPPLIES) ×2 IMPLANT
PADDING CAST ABS COTTON 4X4 ST (CAST SUPPLIES) IMPLANT
PADDING CAST COTTON 6X4 STRL (CAST SUPPLIES) IMPLANT
PASSER SUT SWANSON 36MM LOOP (INSTRUMENTS) IMPLANT
PENCIL SMOKE EVACUATOR (MISCELLANEOUS) ×2 IMPLANT
PIN GUARD 1.57MM GREEN STERILE (PIN) IMPLANT
PLATE TUB 39 W/COLLAR 5H (Plate) IMPLANT
SANITIZER HAND PURELL FF 515ML (MISCELLANEOUS) ×2 IMPLANT
SCREW CANN THREAD 1/2 4.0X34 (Screw) IMPLANT
SCREW COMPRESSION 2.5X24 (Screw) IMPLANT
SCREW COMPRESSION 2.5X25 (Screw) ×2 IMPLANT
SCREW CORT 2.5X20X2.7XST SM (Screw) IMPLANT
SCREW NLOCK CORT 2.7X16 NS (Screw) IMPLANT
SET IRRIGATION TUBING (TUBING) IMPLANT
SHEET MEDIUM DRAPE 40X70 STRL (DRAPES) ×2 IMPLANT
SLEEVE SCD COMPRESS KNEE MED (STOCKING) ×2 IMPLANT
SPLINT PLASTER CAST FAST 5X30 (CAST SUPPLIES) IMPLANT
SPONGE SURGIFOAM ABS GEL 12-7 (HEMOSTASIS) IMPLANT
SPONGE T-LAP 18X18 ~~LOC~~+RFID (SPONGE) ×2 IMPLANT
STOCKINETTE 6 STRL (DRAPES) ×2 IMPLANT
SUCTION TUBE FRAZIER 10FR DISP (SUCTIONS) ×2 IMPLANT
SUT ETHILON 3 0 PS 1 (SUTURE) ×2 IMPLANT
SUT MNCRL AB 3-0 PS2 18 (SUTURE) ×2 IMPLANT
SUT VIC AB 2-0 SH 27XBRD (SUTURE) ×2 IMPLANT
SUT VICRYL 0 SH 27 (SUTURE) IMPLANT
SUT VICRYL 0 UR6 27IN ABS (SUTURE) IMPLANT
SYR BULB EAR ULCER 3OZ GRN STR (SYRINGE) ×2 IMPLANT
SYR CONTROL 10ML LL (SYRINGE) IMPLANT
TOWEL GREEN STERILE FF (TOWEL DISPOSABLE) ×4 IMPLANT
TUBE CONNECTING 20X1/4 (TUBING) ×2 IMPLANT
UNDERPAD 30X36 HEAVY ABSORB (UNDERPADS AND DIAPERS) ×2 IMPLANT
YANKAUER SUCT BULB TIP NO VENT (SUCTIONS) IMPLANT

## 2023-06-27 NOTE — H&P (Signed)
 Kristen Stephenson is an 73 y.o. female.   Chief Complaint: right foot pain HPI: 73 y/o female without significant PMH c/o worseining right forefoot pain due to hallux rigidus, 2nd hammertoe and bunionette.  SHe has failed non op treatment and presents today for hallux MPJ arthrodesis, 2nd hammertoe correction and tailor's bunion correction.  Past Medical History:  Diagnosis Date   Adjustment disorder with anxiety    Arthritis    feet, back   Barrett's esophagus    [facility] Provider Based Lic [facility]Needs Endoscopy in 2023   Cancer [facility])    cervix   Cataract    beginning   GERD (gastroesophageal reflux disease)    H/O ovarian cancer    Heart murmur    History of malignant neoplasm of appendix    Hyperlipidemia    Hyperlipidemia    Lung nodule    Osteopenia    Osteopenia after menopause 09/01/2019   dexa 08/2019   Ovarian cancer (HCC) 2018    Past Surgical History:  Procedure Laterality Date   ABDOMINAL HYSTERECTOMY     ANKLE SURGERY  2003   APPENDECTOMY  2018   Cancer/S/P resection and chemo   ARTHROSCOPIC REPAIR ACL Left    BREAST EXCISIONAL BIOPSY     CYST EXCISION Right 1972   Right breast/benign   FEMUR FRACTURE SURGERY Right    Plate with 5 screws   INGUINAL HERNIA REPAIR  11/2007   TONSILLECTOMY     TOTAL KNEE ARTHROPLASTY Left 06/12/2022   Procedure: LEFT TOTAL KNEE ARTHROPLASTY;  Surgeon: Kathryne Hitch, MD;  Location: [facility] OR;  Service: Orthopedics;  Laterality: Left;   VULVAR LESION REMOVAL  07/2007   benign    Family History  Problem Relation Age of Onset   CAD Mother    Osteoporosis Mother    Diabetes Father    GER disease Father    Rheum arthritis Sister    Asthma Sister    Diabetes Maternal Grandmother        amputation   Colon cancer Neg Hx    Colon polyps Neg Hx    Crohn's disease Neg Hx    Esophageal cancer Neg Hx    Rectal cancer Neg Hx    Stomach cancer Neg Hx    Ulcerative colitis Neg Hx    Social History:  reports that she quit  smoking about 34 years ago. Her smoking use included cigarettes. She has been exposed to tobacco smoke. She has never used smokeless tobacco. She reports current alcohol use. She reports that she does not use drugs.  Allergies: No Known Allergies  Medications Prior to Admission  Medication Sig Dispense Refill   Calcium Carb-Cholecalciferol (CALCIUM 600 + D PO) Take 1 tablet by mouth daily.     Cholecalciferol (VITAMIN D3) 50 MCG (2000 UT) CAPS Take 2,000 Units by mouth daily.     cyanocobalamin (VITAMIN B12) 1000 MCG tablet Take 1,000 mcg by mouth daily.     pantoprazole (PROTONIX) 40 MG tablet Take 1 tablet (40 mg total) by mouth daily. 90 tablet 3   simvastatin (ZOCOR) 20 MG tablet TAKE 1 TABLET (20 MG TOTAL) BY MOUTH AT BEDTIME. 90 tablet 3    No results found for this or any previous visit (from the past 48 hours). No results found.  Review of Systems  no recent f/c/n/v/wt loss  Height 5\' 4"  (1.626 m), weight 60.8 kg. Physical Exam  Wn wd woman in nad.  A and O.  EOMI.  Resp  unlabored.  R forefoot with stiff hallux MPJ, 2nd hammertoe and tailor's bunion.  Skin healthy.  Pulses palpable.  No lymphadenopathy.  Active PF and DF strength of the toes.  Assessment/Plan R hallux rigidus, 2nd hammertoe and tailor's bunion.  To the OR today for surgical forefoot reconstruction.  The risks and benefits of the alternative treatment options have been discussed in detail.  The patient wishes to proceed with surgery and specifically understands risks of bleeding, infection, nerve damage, blood clots, need for additional surgery, amputation and death.   Toni Arthurs, MD 07/25/2023, 7:22 AM

## 2023-06-27 NOTE — Anesthesia Procedure Notes (Signed)
 Procedure Name: LMA Insertion Date/Time: 06/27/2023 9:15 AM  Performed by: Yolanda Bonine, CRNAPre-anesthesia Checklist: Patient identified, Emergency Drugs available, Suction available, Patient being monitored and Timeout performed Patient Re-evaluated:Patient Re-evaluated prior to induction Oxygen Delivery Method: Circle system utilized Preoxygenation: Pre-oxygenation with 100% oxygen Induction Type: IV induction Ventilation: Mask ventilation without difficulty LMA: LMA with gastric port inserted LMA Size: 4.0 Number of attempts: 1 Placement Confirmation: positive ETCO2 Dental Injury: Teeth and Oropharynx as per pre-operative assessment

## 2023-06-27 NOTE — Discharge Instructions (Addendum)
 Toni Arthurs, MD [facility]  Please read the following information regarding your care after surgery.  Medications  You only need a prescription for the narcotic pain medicine (ex. oxycodone, Percocet, Norco).  All of the other medicines listed below are available over the counter. ? Aleve 2 pills twice a day for the first 3 days after surgery. ? acetominophen (Tylenol) 650 mg every 4-6 hours as you need for minor to moderate pain ? oxycodone as prescribed for severe pain  Narcotic pain medicine (ex. oxycodone, Percocet, Vicodin) will cause constipation.  To prevent this problem, take the following medicines while you are taking any pain medicine. ? docusate sodium (Colace) 100 mg twice a day ? senna (Senokot) 2 tablets twice a day  Weight Bearing ? Bear weight only on your operated foot in the CAM boot.   Cast / Splint / Dressing ? Keep your splint, cast or dressing clean and dry.  Don't put anything (coat hanger, pencil, etc) down inside of it.  If it gets damp, use a hair dryer on the cool setting to dry it.  If it gets soaked, call the office to schedule an appointment for a cast change. ? Remove your dressing 3 days after surgery and cover the incisions with dry dressings.    After your dressing, cast or splint is removed; you may shower, but do not soak or scrub the wound.  Allow the water to run over it, and then gently pat it dry.  Swelling It is normal for you to have swelling where you had surgery.  To reduce swelling and pain, keep your toes above your nose for at least 3 days after surgery.  It may be necessary to keep your foot or leg elevated for several weeks.  If it hurts, it should be elevated.  Follow Up Call my office at [phone] when you are discharged from the hospital or surgery center to schedule an appointment to be seen two weeks after surgery.  Call my office at [phone] if you develop a fever >101.5 F, nausea, vomiting, bleeding from the surgical site  or severe pain.    Last dose of NSAIDs given at 10:30am today  Post Anesthesia Home Care Instructions  Activity: Get plenty of rest for the remainder of the day. A responsible individual must stay with you for 24 hours following the procedure.  For the next 24 hours, DO NOT: -Drive a car -Advertising copywriter -Drink alcoholic beverages -Take any medication unless instructed by your physician -Make any legal decisions or sign important papers.  Meals: Start with liquid foods such as gelatin or soup. Progress to regular foods as tolerated. Avoid greasy, spicy, heavy foods. If nausea and/or vomiting occur, drink only clear liquids until the nausea and/or vomiting subsides. Call your physician if vomiting continues.  Special Instructions/Symptoms: Your throat may feel dry or sore from the anesthesia or the breathing tube placed in your throat during surgery. If this causes discomfort, gargle with warm salt water. The discomfort should disappear within 24 hours.  If you had a scopolamine patch placed behind your ear for the management of post- operative nausea and/or vomiting:  1. The medication in the patch is effective for 72 hours, after which it should be removed.  Wrap patch in a tissue and discard in the trash. Wash hands thoroughly with soap and water. 2. You may remove the patch earlier than 72 hours if you experience unpleasant side effects which may include dry mouth, dizziness or visual disturbances. 3.  Avoid touching the patch. Wash your hands with soap and water after contact with the patch.    Regional Anesthesia Blocks  1. You may not be able to move or feel the "blocked" extremity after a regional anesthetic block. This may last may last from 3-48 hours after placement, but it will go away. The length of time depends on the medication injected and your individual response to the medication. As the nerves start to wake up, you may experience tingling as the movement and feeling  returns to your extremity. If the numbness and inability to move your extremity has not gone away after 48 hours, please call your surgeon.   2. The extremity that is blocked will need to be protected until the numbness is gone and the strength has returned. Because you cannot feel it, you will need to take extra care to avoid injury. Because it may be weak, you may have difficulty moving it or using it. You may not know what position it is in without looking at it while the block is in effect.  3. For blocks in the legs and feet, returning to weight bearing and walking needs to be done carefully. You will need to wait until the numbness is entirely gone and the strength has returned. You should be able to move your leg and foot normally before you try and bear weight or walk. You will need someone to be with you when you first try to ensure you do not fall and possibly risk injury.  4. Bruising and tenderness at the needle site are common side effects and will resolve in a few days.  5. Persistent numbness or new problems with movement should be communicated to the surgeon or the [facility] [id number] [facility] [phone]).

## 2023-06-27 NOTE — Anesthesia Procedure Notes (Addendum)
 Anesthesia Regional Block: Popliteal block   Pre-Anesthetic Checklist: , timeout performed,  Correct Patient, Correct Site, Correct Laterality,  Correct Procedure, Correct Position, site marked,  Risks and benefits discussed,  Pre-op evaluation,  At surgeon's request and post-op pain management  Laterality: Lower and Right  Prep: Maximum Sterile Barrier Precautions used, chloraprep       Needles:  Injection technique: Single-shot  Needle Type: Echogenic Needle     Needle Length: 9cm  Needle Gauge: 21     Additional Needles:   Procedures:,,,, ultrasound used (permanent image in chart),,    Narrative:  Start time: 06/27/2023 8:17 AM End time: 06/27/2023 8:22 AM Injection made incrementally with aspirations every 5 mL.  Performed by: Personally  Anesthesiologist: Trevor Iha, MD  Additional Notes: Block assessed. Patient tolerated procedure well.

## 2023-06-27 NOTE — Op Note (Signed)
 06/27/2023  10:39 AM  PATIENT:  Kristen Stephenson  73 y.o. female  PRE-OPERATIVE DIAGNOSIS: 1.  Right foot painful bunion deformity and hallux rigidus  2.  Right foot metatarsalgia 3.  Right second hammertoe deformity 4.  Right second MTP joint dorsal contracture 5.  Right fifth metatarsal exostosis  POST-OPERATIVE DIAGNOSIS: Same  Procedure(s): 1.  Right hallux MP joint arthrodesis and silver bunionectomy 2.  Right second MTP joint dorsal capsulotomy and extensor tendon lengthening 3.  Right second metatarsal Weil osteotomy through a separate incision 4.  Right second hammertoe correction 5.  Right fifth metatarsal exostectomy 6.  Right foot AP, oblique and lateral radiographs  SURGEON:  Toni Arthurs, MD  ASSISTANT: Alfredo Martinez, PA-C  ANESTHESIA:   General, regional  EBL:  minimal   TOURNIQUET:   Total Tourniquet Time Documented: Thigh (Right) - 59 minutes Total: Thigh (Right) - 59 minutes  COMPLICATIONS:  None apparent  DISPOSITION:  Extubated, awake and stable to recovery.  INDICATION FOR PROCEDURE: 73 year old female with worsening right forefoot pain and deformity over the last several years.  She has a prominent bunion deformity as well as degenerative changes of the hallux MP joint.  She also has metatarsalgia and a second hammertoe.  She has had a previous fifth metatarsal fracture that healed with a prominent bone spur plantar laterally.  She has failed nonoperative treatment including activity modification, oral anti-inflammatories and shoewear modification.  She presents now for surgical treatment of these painful forefoot conditions.  The risks and benefits of the alternative treatment options have been discussed in detail.  The patient wishes to proceed with surgery and specifically understands risks of bleeding, infection, nerve damage, blood clots, need for additional surgery, amputation and death.   PROCEDURE IN DETAIL:  After pre operative consent was  obtained, and the correct operative site was identified, the patient was brought to the operating room and placed supine on the OR table.  Anesthesia was administered.  Pre-operative antibiotics were administered.  A surgical timeout was taken.  The right lower extremity was prepped and draped in standard sterile fashion with a tourniquet around the thigh.  The extremity was elevated, and the tourniquet was inflated to 250 mmHg.  A longitudinal incision was made over the hallux MP joint.  Dissection was carried sharply down through the subcutaneous tissues.  The collateral ligaments were released exposing the metatarsal head.  The hypertrophic medial eminence was resected with a rondure completing the silver bunionectomy.  A concave reamer was used to remove the remaining articular cartilage and subchondral bone from the metatarsal head.  A convex reamer was used to complete the same task for the base of the proximal phalanx.  The joint was irrigated and perforated with a small drill bit.  The joint was reduced and provisionally pinned.  Radiographs confirmed appropriate reduction of the joint.  The joint was compressed with a 4 mm stainless steel partially-threaded cannulated screw from the [employer] cannulated screw set.  A 5 hole one quarter tubular plate was contoured to fit the joint dorsally.  It was secured proximally with 2 bicortical screws and distally with 2 bicortical screws.  AP and lateral radiographs confirmed appropriate reduction of the joint and position and length of all hardware.  The wound was irrigated and sprinkled with vancomycin powder.  It was closed with 2-0 Vicryl and 3-0 nylon.  Attention was turned to the second metatarsal.  A Beaver blade was inserted into the MTP joint under fluoroscopic guidance.  The dorsal joint capsule was released followed by the extensor tendons.  This allowed passive correction of the toe to a neutral position.  A second incision was made along the  metatarsal shaft.  Under fluoroscopic guidance an oblique osteotomy was made from plantar proximal to dorsal distal.  The osteotomy was mobilized with a straight periosteal elevator.  Attention was turned to the second toe where an incision was made adjacent to the PIP joint.  Under fluoroscopic guidance a 2 x 13 mm Shannon bur was used to remove the subchondral bone and articular cartilage from both sides of the joint.  The bone paste was expressed from the incision.  The joint was reduced.  A K wire was inserted across the IP joints.  Radiographs confirmed appropriate reduction of the hammertoe deformity.  The PIP joint was then fixed with a 2.5 mm Arthrex headless compression screw.  Attention was turned to the fifth metatarsal.  The exostosis was identified on the oblique radiograph.  A small incision was made just distal to the exostosis.  A periosteal elevator was used to elevate the soft tissues creating a working space.  The bur was then inserted under fluoroscopic guidance.  The bur was used to resect the bony prominence in its entirety.  The wound was irrigated and all bone paste and fragments were removed.  Final AP, oblique and lateral radiographs confirmed appropriate alignment across the hallux MP joint and shortening of the second metatarsal with hammertoe correction of the second toe.  Also noted is excision of the prominent bone at the fifth metatarsal plantar laterally.  Sterile dressings were applied after closing the incisions with 3-0 Monocryl.  The tourniquet was released after applying a compression wrap.  The patient was awakened from anesthesia and transported to the recovery room in stable condition.  FOLLOW UP PLAN: Weightbearing as tolerated on the heel in a cam boot.  Follow-up in 2 weeks for suture removal and first webspace toe spacer.  No indication for DVT prophylaxis in this ambulatory patient.   RADIOGRAPHS: AP, lateral and oblique radiographs of the right foot are  obtained intraoperatively.  These show interval correction of the bunion deformity and hallux MP joint arthrodesis.  Second metatarsal shortening and hammertoe correction are noted along with excision of the fifth metatarsal base exostosis.  Hardware is appropriately positioned and of the appropriate lengths.  No other acute injuries are noted.    Alfredo Martinez PA-C was present and scrubbed for the duration of the operative case. His assistance was essential in positioning the patient, prepping and draping, gaining and maintaining exposure, performing the operation, closing and dressing the wounds and applying the splint.

## 2023-06-27 NOTE — Anesthesia Procedure Notes (Signed)
 Anesthesia Regional Block: Adductor canal block   Pre-Anesthetic Checklist: , timeout performed,  Correct Patient, Correct Site, Correct Laterality,  Correct Procedure, Correct Position, site marked,  Risks and benefits discussed,  Surgical consent,  Pre-op evaluation,  At surgeon's request and post-op pain management  Laterality: Lower and Right  Prep: chloraprep       Needles:  Injection technique: Single-shot  Needle Type: Echogenic Needle     Needle Length: 9cm  Needle Gauge: 22     Additional Needles:   Procedures:,,,, ultrasound used (permanent image in chart),,    Narrative:  Start time: 06/27/2023 8:22 AM End time: 06/27/2023 8:25 AM Injection made incrementally with aspirations every 5 mL.  Performed by: Personally  Anesthesiologist: Trevor Iha, MD  Additional Notes: Block assessed prior to surgery. Pt tolerated procedure well.

## 2023-06-27 NOTE — Transfer of Care (Signed)
 Immediate Anesthesia Transfer of Care Note  Patient: Kristen Stephenson  Procedure(s) Performed: FUSION, JOINT, GREAT TOE (Right: Toe) 2nd HAMMERTOE RECONSTRUCTION WITH WEIL OSTEOTOMY (Right: Toe) FLEXOR AND EXTENSOR TENDON REPAIR (Right: Foot) 5TH METATARSAL EXOSTOSIS EXCISION (Right: Toe)  Patient Location: PACU  Anesthesia Type:GA combined with regional for post-op pain  Level of Consciousness: drowsy  Airway & Oxygen Therapy: Patient Spontanous Breathing and Patient connected to face mask oxygen  Post-op Assessment: Report given to RN and Post -op Vital signs reviewed and stable  Post vital signs: Reviewed and stable  Last Vitals:  Vitals Value Taken Time  BP 102/65 06/27/23 1035  Temp 36.3 C 06/27/23 1035  Pulse 76 06/27/23 1039  Resp 15 06/27/23 1039  SpO2 98 % 06/27/23 1039  Vitals shown include unfiled device data.  Last Pain:  Vitals:   06/27/23 0732  TempSrc: Tympanic  PainSc: 0-No pain      Patients Stated Pain Goal: 7 (06/27/23 0732)  Complications: No notable events documented.

## 2023-06-27 NOTE — Anesthesia Postprocedure Evaluation (Signed)
 Anesthesia Post Note  Patient: Elodia Florence Janvrin  Procedure(s) Performed: FUSION, JOINT, GREAT TOE (Right: Toe) 2nd HAMMERTOE RECONSTRUCTION WITH WEIL OSTEOTOMY (Right: Toe) FLEXOR AND EXTENSOR TENDON REPAIR (Right: Foot) 5TH METATARSAL EXOSTOSIS EXCISION (Right: Toe)     Patient location during evaluation: PACU Anesthesia Type: Regional and General Level of consciousness: awake and alert Pain management: pain level controlled Vital Signs Assessment: post-procedure vital signs reviewed and stable Respiratory status: spontaneous breathing, nonlabored ventilation, respiratory function stable and patient connected to nasal cannula oxygen Cardiovascular status: blood pressure returned to baseline and stable Postop Assessment: no apparent nausea or vomiting Anesthetic complications: no  No notable events documented.  Last Vitals:  Vitals:   06/27/23 1045 06/27/23 1105  BP: 106/67 123/77  Pulse: 68 71  Resp: 12 15  Temp:  (!) 36.2 C  SpO2: 94% 93%    Last Pain:  Vitals:   06/27/23 1105  TempSrc: Temporal  PainSc: 0-No pain                 Trevor Iha

## 2023-06-27 NOTE — Progress Notes (Signed)
Assisted Dr. Houser with right, adductor canal, popliteal, ultrasound guided block. Side rails up, monitors on throughout procedure. See vital signs in flow sheet. Tolerated Procedure well. ?

## 2023-06-28 ENCOUNTER — Encounter (HOSPITAL_BASED_OUTPATIENT_CLINIC_OR_DEPARTMENT_OTHER): Payer: Self-pay | Admitting: Orthopedic Surgery

## 2023-07-12 ENCOUNTER — Encounter: Payer: Self-pay | Admitting: Family Medicine

## 2023-07-12 ENCOUNTER — Ambulatory Visit: Admitting: Family Medicine

## 2023-07-12 VITALS — BP 134/79 | HR 59 | Temp 98.1°F | Ht 64.0 in | Wt 134.4 lb

## 2023-07-12 DIAGNOSIS — D126 Benign neoplasm of colon, unspecified: Secondary | ICD-10-CM

## 2023-07-12 DIAGNOSIS — Z79899 Other long term (current) drug therapy: Secondary | ICD-10-CM | POA: Diagnosis not present

## 2023-07-12 DIAGNOSIS — Z8543 Personal history of malignant neoplasm of ovary: Secondary | ICD-10-CM

## 2023-07-12 DIAGNOSIS — E782 Mixed hyperlipidemia: Secondary | ICD-10-CM

## 2023-07-12 DIAGNOSIS — N390 Urinary tract infection, site not specified: Secondary | ICD-10-CM

## 2023-07-12 DIAGNOSIS — K219 Gastro-esophageal reflux disease without esophagitis: Secondary | ICD-10-CM

## 2023-07-12 DIAGNOSIS — M8580 Other specified disorders of bone density and structure, unspecified site: Secondary | ICD-10-CM

## 2023-07-12 DIAGNOSIS — K2270 Barrett's esophagus without dysplasia: Secondary | ICD-10-CM

## 2023-07-12 DIAGNOSIS — Z0001 Encounter for general adult medical examination with abnormal findings: Secondary | ICD-10-CM | POA: Diagnosis not present

## 2023-07-12 DIAGNOSIS — E538 Deficiency of other specified B group vitamins: Secondary | ICD-10-CM

## 2023-07-12 DIAGNOSIS — Z780 Asymptomatic menopausal state: Secondary | ICD-10-CM | POA: Diagnosis not present

## 2023-07-12 LAB — CBC WITH DIFFERENTIAL/PLATELET
Basophils Absolute: 0 10*3/uL (ref 0.0–0.1)
Basophils Relative: 0.6 % (ref 0.0–3.0)
Eosinophils Absolute: 0.1 10*3/uL (ref 0.0–0.7)
Eosinophils Relative: 2.5 % (ref 0.0–5.0)
HCT: 40.1 % (ref 36.0–46.0)
Hemoglobin: 13.4 g/dL (ref 12.0–15.0)
Lymphocytes Relative: 22.1 % (ref 12.0–46.0)
Lymphs Abs: 1.3 10*3/uL (ref 0.7–4.0)
MCHC: 33.3 g/dL (ref 30.0–36.0)
MCV: 91.1 fl (ref 78.0–100.0)
Monocytes Absolute: 0.4 10*3/uL (ref 0.1–1.0)
Monocytes Relative: 6.8 % (ref 3.0–12.0)
Neutro Abs: 4 10*3/uL (ref 1.4–7.7)
Neutrophils Relative %: 68 % (ref 43.0–77.0)
Platelets: 281 10*3/uL (ref 150.0–400.0)
RBC: 4.4 Mil/uL (ref 3.87–5.11)
RDW: 13.4 % (ref 11.5–15.5)
WBC: 5.8 10*3/uL (ref 4.0–10.5)

## 2023-07-12 LAB — COMPREHENSIVE METABOLIC PANEL WITH GFR
ALT: 9 U/L (ref 0–35)
AST: 16 U/L (ref 0–37)
Albumin: 4.6 g/dL (ref 3.5–5.2)
Alkaline Phosphatase: 51 U/L (ref 39–117)
BUN: 20 mg/dL (ref 6–23)
CO2: 31 meq/L (ref 19–32)
Calcium: 9.6 mg/dL (ref 8.4–10.5)
Chloride: 104 meq/L (ref 96–112)
Creatinine, Ser: 0.65 mg/dL (ref 0.40–1.20)
GFR: 88.79 mL/min (ref >60.00)
Glucose, Bld: 111 mg/dL — ABNORMAL HIGH (ref 70–99)
Potassium: 3.9 meq/L (ref 3.5–5.1)
Sodium: 143 meq/L (ref 135–145)
Total Bilirubin: 0.5 mg/dL (ref 0.2–1.2)
Total Protein: 6.8 g/dL (ref 6.0–8.3)

## 2023-07-12 LAB — LIPID PANEL
Cholesterol: 199 mg/dL (ref 0–200)
HDL: 71.4 mg/dL (ref >39.00)
LDL Cholesterol: 107 mg/dL — ABNORMAL HIGH (ref 0–99)
NonHDL: 127.27
Total CHOL/HDL Ratio: 3
Triglycerides: 101 mg/dL (ref 0.0–149.0)
VLDL: 20.2 mg/dL (ref 0.0–40.0)

## 2023-07-12 LAB — VITAMIN D 25 HYDROXY (VIT D DEFICIENCY, FRACTURES): VITD: 57.36 ng/mL (ref 30.00–100.00)

## 2023-07-12 LAB — TSH: TSH: 2.92 u[IU]/mL (ref 0.35–5.50)

## 2023-07-12 LAB — VITAMIN B12: Vitamin B-12: 692 pg/mL (ref 211–911)

## 2023-07-12 NOTE — Progress Notes (Signed)
 Subjective  Chief Complaint  Patient presents with   Annual Exam    Pt here for Annual Exam and is not currently fasting    Hyperlipidemia    HPI: Kristen Stephenson is a 73 y.o. female who presents to [facility] [place] today for a Female Wellness Visit. She also has the concerns and/or needs as listed above in the chief complaint. These will be addressed in addition to the Health Maintenance Visit.   Wellness Visit: annual visit with health maintenance review and exam  HM: screens are current. Chronic disease f/u and/or acute problem visit: (deemed necessary to be done in addition to the wellness visit): Discussed the use of AI scribe software for clinical note transcription with the patient, who gave verbal consent to proceed.  History of Present Illness   Kristen Stephenson "Isidra-Esther" is a 73 year old female who presents for a routine follow-up visit.  She is in the post-surgical recovery phase following recent bunion and hammer toe surgery. She is wearing a boot and anticipates a recovery period of six to eight weeks. She describes some soreness but is managing well.  She is concerned about her bone density, which has worsened since the last check three years ago, though it is not yet at the level of osteoporosis. Elevated FRAX score.  She takes vitamin D and calcium supplements and exercises regularly.  She has a history of Barrett's esophagus, which remains stable. Her medication was changed to a stronger one due to this condition, and she is currently taking pantoprazole 40 mg daily.  She is on simvastatin 20 mg for cholesterol management and wants to minimize medication use.  Her blood pressure was noted to be slightly elevated at 134, which she attributes to anxiety from multiple medical appointments in one day.  She reports occasional stool leakage, particularly during physical activities like hiking. She has not experienced this issue for a couple of  weeks and notes that her bowel movements are otherwise normal.  She experiences sleep disturbances, characterized by waking up multiple times during the night. She attributes this to using her iPad before bed and has switched to using a Kindle with a black screen. She does not wish to use sleeping pills.  She mentions concerns about memory, with her husband suggesting early dementia. She describes occasional forgetfulness, such as misplacing items, but does not report significant functional impairment.       Assessment  1. Encounter for well adult exam with abnormal findings   2. Mixed hyperlipidemia   3. Barrett's esophagus without dysplasia   4. History of ovarian cancer   5. Gastroesophageal reflux disease, unspecified whether esophagitis present   6. Osteopenia after menopause   7. Postcoital UTI - recurrent   8. Long-term current use of proton pump inhibitor therapy   9. Vitamin B12 deficiency   10. Tubular adenoma of colon      Plan  Female Wellness Visit: Age appropriate Health Maintenance and Prevention measures were discussed with patient. Included topics are cancer screening recommendations, ways to keep healthy (see AVS) including dietary and exercise recommendations, regular eye and dental care, use of seat belts, and avoidance of moderate alcohol use and tobacco use.  BMI: discussed patient's BMI and encouraged positive lifestyle modifications to help get to or maintain a target BMI. HM needs and immunizations were addressed and ordered. See below for orders. See HM and immunization section for updates. Routine labs and screening tests ordered including cmp,  cbc and lipids where appropriate. Discussed recommendations regarding Vit D and calcium supplementation (see AVS)  Chronic disease management visit and/or acute problem visit: Assessment and Plan    Postoperative care for bunion and hammer toe surgery She is in the postoperative phase, wearing a boot with mild  soreness. Recovery expected in 6-8 weeks. - Advise on postoperative care and activity restrictions.  Osteopenia Bone density worsened with T-score of -2.1. Fracture risk borderline. Treatment decision based on preference. - Continue calcium and vitamin D supplementation. - Continue regular exercise. - Recheck bone density next year. - Consider treatment if bone density worsens or if she opts for proactive approach.  Hyperlipidemia Simvastatin 20 mg effectively managing cholesterol. Continued medication emphasized to prevent cardiovascular risks. - Continue simvastatin 20 mg daily. - Monitor cholesterol levels.  Barrett's esophagus Well-managed with pantoprazole. - Continue pantoprazole as prescribed.  Fecal incontinence Occasional stool leakage noted, not experienced recently. Fiber may help; monitor protein supplements. - Try fiber supplements like Fibercon or Metamucil. - Monitor intake of protein supplements. - Consult GI doctor if issue persists.  Insomnia Difficulty staying asleep, advised against iPad use. Suggested meditation apps or sleep stories. - Try meditation app 'Insight Timer' for sleep. - Consider using sleep stories or white noise apps.  Cognitive concerns Forgetfulness attributed to multitasking and normal aging, not significant functional decline. - Schedule annual wellness visit with cognitive screening.  General Health Maintenance Up to date with most vaccinations. COVID booster suggested. - Administer COVID booster.  Follow-up Scheduled for dermatology and blood work. - Attend dermatology appointment on April 14. - Complete blood work. - Review blood work results and follow up as needed.       Follow up: 12 mo for cpe  Orders Placed This Encounter  Procedures   TSH   VITAMIN D 25 Hydroxy (Vit-D Deficiency, Fractures)   CBC with Differential/Platelet   Comprehensive metabolic panel with GFR   Lipid panel   Vitamin B12   No orders of the  defined types were placed in this encounter.     Body mass index is 23.07 kg/m. Wt Readings from Last 3 Encounters:  07/12/23 134 lb 6.4 oz (61 kg)  06/27/23 135 lb 9.3 oz (61.5 kg)  05/14/23 132 lb (59.9 kg)     Patient Active Problem List   Diagnosis Date Noted Date Diagnosed   Tubular adenoma of colon 07/12/2023     Priority: High    Colonoscopy 2024, Dr. Jonny Ruiz. 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 Could consider starting meds.     Anxiety 09/16/2018     Priority: Medium    GERD (gastroesophageal reflux disease) 09/16/2018     Priority: Medium    Status post total left knee replacement 06/12/2022  Priority: Low   Vitamin B12 deficiency 05/14/2022     Priority: Low   Long-term current use of proton pump inhibitor therapy 05/03/2022     Priority: Low   Pulmonary nodules 09/23/2019     Priority: Low    Stable on fu chest CT 12/2019    Presbycusis of both ears 09/23/2019     Priority: Low   Health Maintenance  Topic Date Due   Medicare Annual Wellness (AWV)  07/29/2022   COVID-19 Vaccine (7 - 2024-25 season) 07/28/2023 (Originally 12/22/2022)   INFLUENZA VACCINE  11/08/2023   MAMMOGRAM  03/17/2024   DEXA SCAN  11/20/2024   DTaP/Tdap/Td (2 - Td or Tdap) 05/15/2026   Colonoscopy  01/14/2028   Pneumonia Vaccine 48+ Years old   Completed   Hepatitis C Screening  Completed   Zoster Vaccines- Shingrix  Completed   HPV VACCINES  Aged Out   Immunization History  Administered Date(s) Administered   Fluad Quad(high Dose 65+) 02/07/2022   Influenza Split 11/28/2022   Influenza, High Dose Seasonal PF 12/21/2019, 12/08/2020   Influenza-Unspecified 04/28/2014, 05/09/2015, 05/15/2016, 05/20/2017, 05/22/2018, 11/21/2018   PFIZER Comirnaty(Gray Top)Covid-19 Tri-Sucrose Vaccine 07/26/2020   PFIZER(Purple Top)SARS-COV-2 Vaccination 05/24/2019, 06/16/2019, 03/16/2020, 10/27/2022   Pfizer(Comirnaty)Fall Seasonal Vaccine 12 years and older 02/07/2022   Pneumococcal Conjugate-13 10/01/2017   Pneumococcal Polysaccharide-23 11/21/2018   Tdap 05/15/2016   Zoster Recombinant(Shingrix) 09/30/2019, 12/21/2019   Zoster, Live 04/28/2014   We updated and reviewed the patient's past history in detail and it is documented below. Allergies: Patient has no known allergies. Past Medical History Patient  has a past medical history of Adjustment disorder with anxiety, Arthritis, Barrett's esophagus, Cancer (HCC), Cataract, GERD (gastroesophageal reflux disease), H/O ovarian cancer, Heart murmur, History of malignant neoplasm of appendix, Hyperlipidemia, Hyperlipidemia, Lung nodule, Osteopenia, Osteopenia after menopause (09/01/2019), and Ovarian cancer (HCC) (2018). Past Surgical History Patient  has a past surgical history that includes Abdominal hysterectomy; Appendectomy (2018); Arthroscopic repair ACL (Left); Femur fracture surgery (Right); Cyst excision (Right, 1972); Tonsillectomy; Ankle surgery (2003); Vulvar lesion removal (07/2007); Inguinal hernia repair (11/2007); Breast excisional biopsy; Total knee arthroplasty (Left, 06/12/2022); Arthrodesis metatarsalphalangeal joint (mtpj) (Right, 06/27/2023); Hammertoe reconstruction with weil osteotomy (Right, 06/27/2023); Flexor tendon repair (Right, 06/27/2023); and Bone exostosis excision (Right,  06/27/2023). Family History: Patient family history includes Asthma in her sister; CAD in her mother; Diabetes in her father and maternal grandmother; GER disease in her father; Osteoporosis in her mother; Rheum arthritis in her sister. Social History:  Patient  reports that she quit smoking about 34 years ago. Her smoking use included cigarettes. She has been exposed to tobacco smoke. She has never used smokeless tobacco. She reports current alcohol use. She reports that she does not use drugs.  Review of Systems: Constitutional: negative for fever or malaise Ophthalmic: negative for photophobia, double vision or loss of vision Cardiovascular: negative for chest pain, dyspnea on exertion, or new LE swelling Respiratory: negative for SOB or persistent cough Gastrointestinal: negative for abdominal pain, change in bowel habits or melena Genitourinary: negative for dysuria or gross hematuria, no abnormal uterine bleeding or disharge Musculoskeletal: negative for new gait disturbance or muscular weakness Integumentary: negative for new or persistent rashes, no breast lumps Neurological: negative for TIA or stroke symptoms Psychiatric: negative for SI or delusions Allergic/Immunologic: negative for hives  Patient Care Team    Relationship Specialty Notifications Start End  Willow Ora, MD PCP - General Family Medicine  03/21/22    Comment: Jenny Reichmann,  Terrace Arabia, MD (Inactive) Consulting Physician Hematology  04/05/19   Dahlia Byes, [facility] Pharmacist Pharmacist  07/26/20    Comment: Phone [phone]  Napoleon Form, MD Consulting Physician Gastroenterology  05/03/22     Objective  Vitals: BP 134/79   Pulse (!) 59   Temp 98.1 F (36.7 C)   Ht 5\' 4"  (1.626 m)   Wt 134 lb 6.4 oz (61 kg)   SpO2 95%   BMI 23.07 kg/m  General:  Well developed, well nourished, no acute distress  Psych:  Alert and orientedx3,normal mood and affect HEENT:  Normocephalic, atraumatic, non-icteric sclera,   supple neck without adenopathy, mass or thyromegaly Cardiovascular:  Normal S1, S2, RRR without gallop, rub or murmur Respiratory:  Good breath sounds bilaterally, CTAB with normal respiratory effort Gastrointestinal: normal bowel sounds, soft, non-tender, no noted masses. No HSM MSK: extremities without edema, joints without erythema or swelling Neurologic:    Mental status is normal.  Gross motor and sensory exams are normal.  No tremor  Commons side effects, risks, benefits, and alternatives for medications and treatment plan prescribed today were discussed, and the patient expressed understanding of the given instructions. Patient is instructed to call or message via MyChart if he/she has any questions or concerns regarding our treatment plan. No barriers to understanding were identified. We discussed Red Flag symptoms and signs in detail. Patient expressed understanding regarding what to do in case of urgent or emergency type symptoms.  Medication list was reconciled, printed and provided to the patient in AVS. Patient instructions and summary information was reviewed with the patient as documented in the AVS. This note was prepared with assistance of Dragon voice recognition software. Occasional wrong-word or sound-a-like substitutions may have occurred due to the inherent limitations of voice recognition software

## 2023-07-12 NOTE — Patient Instructions (Signed)
 Please return in 12 months for your annual complete physical; please come fasting.   I will release your lab results to you on your MyChart account with further instructions. You may see the results before I do, but when I review them I will send you a message with my report or have my assistant call you if things need to be discussed. Please reply to my message with any questions. Thank you!   If you have any questions or concerns, please don't hesitate to send me a message via MyChart or call the office at [phone]. Thank you for visiting with Kristen Stephenson today! It's our pleasure caring for you.   VISIT SUMMARY:  During your routine follow-up visit, we discussed your recovery from recent bunion and hammer toe surgery, concerns about bone density, cholesterol management, Barrett's esophagus, occasional stool leakage, sleep disturbances, and cognitive concerns. We also reviewed your general health maintenance and upcoming appointments.  YOUR PLAN:  -POSTOPERATIVE CARE FOR BUNION AND HAMMER TOE SURGERY: You are in the postoperative phase, wearing a boot with mild soreness. Recovery is expected in 6-8 weeks. Please follow the advised postoperative care and activity restrictions.  -OSTEOPENIA: Osteopenia means your bone density is lower than normal but not low enough to be classified as osteoporosis. Continue taking calcium and vitamin D supplements, exercise regularly, and recheck your bone density next year. We will consider treatment if your bone density worsens or if you prefer a proactive approach.  -HYPERLIPIDEMIA: Hyperlipidemia means you have high cholesterol levels. Continue taking simvastatin 20 mg daily to manage your cholesterol and prevent cardiovascular risks. We will monitor your cholesterol levels.  -BARRETT'S ESOPHAGUS: Barrett's esophagus is a condition where the lining of your esophagus is damaged by stomach acid. Continue taking pantoprazole as prescribed to manage this  condition.  -FECAL INCONTINENCE: Fecal incontinence is the occasional leakage of stool. Try fiber supplements like Fibercon or Metamucil and monitor your intake of protein supplements. Consult a GI doctor if the issue persists.  -INSOMNIA: Insomnia is difficulty staying asleep. Try using the meditation app 'Insight Timer' for sleep, and consider using sleep stories or white noise apps.  -COGNITIVE CONCERNS: Your forgetfulness is likely due to multitasking and normal aging, and there is no significant functional decline. Schedule an annual wellness visit with cognitive screening.  -GENERAL HEALTH MAINTENANCE: You are up to date with most vaccinations. It is suggested to get a COVID booster.  INSTRUCTIONS:  Please attend your dermatology appointment on April 14 and complete your blood work. We will review the blood work results and follow up as needed.

## 2023-07-13 ENCOUNTER — Encounter: Payer: Self-pay | Admitting: Family Medicine

## 2023-07-13 MED ORDER — SIMVASTATIN 40 MG PO TABS: 40.0000 mg | ORAL_TABLET | Freq: Every day | ORAL | 3 refills | Status: AC

## 2023-07-13 NOTE — Progress Notes (Signed)
 Labs reviewed. Nonfasting state The 10-year ASCVD risk score (Arnett DK, et al., 2019) is: 11% on simvastatin 20 will increase to moderate intensity statin to push ldl < 100 (change to simvastatin 40)   Values used to calculate the score:     Age: 73 years     Sex: Female     Is Non-Hispanic African American: No     Diabetic: No     Tobacco smoker: No     Systolic Blood Pressure: 134 mmHg     Is BP treated: No     HDL Cholesterol: 71.4 mg/dL     Total Cholesterol: 199 mg/dL

## 2023-07-13 NOTE — Addendum Note (Signed)
 Addended by: Asencion Partridge on: 07/13/2023 04:37 PM   Modules accepted: Orders

## 2023-07-18 ENCOUNTER — Telehealth: Payer: Self-pay | Admitting: Family Medicine

## 2023-07-18 NOTE — Telephone Encounter (Unsigned)
 Copied from CRM [id number]. Topic: Appointments - Scheduling Inquiry for Clinic >> Jul 18, 2023  2:16 PM Kristen Stephenson wrote: Reason for CRM: Patient called in stating that Dr. Mardelle Stephenson mentioned scheduling a test for dementia after Medicare Annual Wellness visit. States she was told it could be done online and was interested in getting the next steps to complete it.

## 2023-07-19 NOTE — Telephone Encounter (Signed)
 Spoke with pt and she will cb to the office to confirm her appt with Inetta Fermo

## 2023-07-22 DIAGNOSIS — D2261 Melanocytic nevi of right upper limb, including shoulder: Secondary | ICD-10-CM | POA: Diagnosis not present

## 2023-07-22 DIAGNOSIS — D2262 Melanocytic nevi of left upper limb, including shoulder: Secondary | ICD-10-CM | POA: Diagnosis not present

## 2023-07-22 DIAGNOSIS — D225 Melanocytic nevi of trunk: Secondary | ICD-10-CM | POA: Diagnosis not present

## 2023-08-16 DIAGNOSIS — M2041 Other hammer toe(s) (acquired), right foot: Secondary | ICD-10-CM | POA: Diagnosis not present

## 2023-08-16 DIAGNOSIS — M2021 Hallux rigidus, right foot: Secondary | ICD-10-CM | POA: Diagnosis not present

## 2023-08-16 DIAGNOSIS — Z4789 Encounter for other orthopedic aftercare: Secondary | ICD-10-CM | POA: Diagnosis not present

## 2023-08-23 DIAGNOSIS — M79671 Pain in right foot: Secondary | ICD-10-CM | POA: Diagnosis not present

## 2023-08-29 DIAGNOSIS — M79671 Pain in right foot: Secondary | ICD-10-CM | POA: Diagnosis not present

## 2023-09-04 ENCOUNTER — Ambulatory Visit (INDEPENDENT_AMBULATORY_CARE_PROVIDER_SITE_OTHER)

## 2023-09-04 ENCOUNTER — Encounter: Payer: Self-pay | Admitting: Family Medicine

## 2023-09-04 VITALS — Ht 64.0 in | Wt 134.0 lb

## 2023-09-04 DIAGNOSIS — Z0000 Encounter for general adult medical examination without abnormal findings: Secondary | ICD-10-CM

## 2023-09-04 NOTE — Progress Notes (Signed)
 Subjective:   Kristen Stephenson is a 73 y.o. who presents for a Medicare Wellness preventive visit.  As a reminder, Annual Wellness Visits don't include a physical exam, and some assessments may be limited, especially if this visit is performed virtually. We may recommend an in-person follow-up visit with your provider if needed.  Visit Complete: Virtual I connected with  Kristen Stephenson on 09/04/23 by a audio enabled telemedicine application and verified that I am speaking with the correct person using two identifiers.  Patient Location: Home  Provider Location: Home Office  I discussed the limitations of evaluation and management by telemedicine. The patient expressed understanding and agreed to proceed.  Vital Signs: Because this visit was a virtual/telehealth visit, some criteria may be missing or patient reported. Any vitals not documented were not able to be obtained and vitals that have been documented are patient reported.  VideoDeclined- This patient declined Librarian, academic. Therefore the visit was completed with audio only.  Persons Participating in Visit: Patient.  AWV Questionnaire: Yes: Patient Medicare AWV questionnaire was completed by the patient on 09/03/23; I have confirmed that all information answered by patient is correct and no changes since this date.  Cardiac Risk Factors include: advanced age (>33men, >45 women);dyslipidemia     Objective:     Today's Vitals   09/03/23 1128 09/04/23 0841  Weight:  134 lb (60.8 kg)  Height:  5\' 4"  (1.626 m)  PainSc: 2     Body mass index is 23 kg/m.     09/04/2023    8:49 AM 06/27/2023    7:26 AM 05/08/2023   10:22 AM 01/23/2023   10:50 AM 06/26/2022   12:59 PM 06/12/2022   12:50 PM 06/06/2022    9:07 AM  Advanced Directives  Does Patient Have a Medical Advance Directive? Yes Yes Yes Yes Yes Yes Yes  Type of Estate agent of [place];Living will  Living will  Healthcare Power of [place];Living will Living will Living will Living will  Does patient want to make changes to medical advance directive?  No - Guardian declined   No - Patient declined No - Patient declined No - Patient declined  Copy of Healthcare Power of Attorney in Chart? No - copy requested        Would patient like information on creating a medical advance directive? No - Patient declined    No - Patient declined No - Patient declined No - Patient declined    Current Medications (verified) Outpatient Encounter Medications as of 09/04/2023  Medication Sig   ABRYSVO 120 MCG/0.5ML injection    Calcium Carb-Cholecalciferol (CALCIUM 600 + D PO) Take 1 tablet by mouth daily.   Cholecalciferol (VITAMIN D3) 50 MCG (2000 UT) CAPS Take 2,000 Units by mouth daily.   COMIRNATY syringe    cyanocobalamin  (VITAMIN B12) 1000 MCG tablet Take 1,000 mcg by mouth daily.   pantoprazole  (PROTONIX ) 40 MG tablet Take 1 tablet (40 mg total) by mouth daily.   PREVNAR 20 0.5 ML injection    simvastatin  (ZOCOR ) 40 MG tablet Take 1 tablet (40 mg total) by mouth at bedtime.   No facility-administered encounter medications on file as of 09/04/2023.    Allergies (verified) Patient has no known allergies.   History: Past Medical History:  Diagnosis Date   Adjustment disorder with anxiety    Arthritis    feet, back   Barrett's esophagus    [facility]eds Endoscopy in 2023  Cancer [facility])    cervix   Cataract    beginning   GERD (gastroesophageal reflux disease)    H/O ovarian cancer    Heart murmur    History of malignant neoplasm of appendix    Hyperlipidemia    Hyperlipidemia    Lung nodule    Osteopenia    Osteopenia after menopause 09/01/2019   dexa 08/2019   Ovarian cancer (HCC) 2018   Past Surgical History:  Procedure Laterality Date   ABDOMINAL HYSTERECTOMY     ANKLE SURGERY  2003   APPENDECTOMY  2018   Cancer/S/P resection and chemo   ARTHRODESIS  METATARSALPHALANGEAL JOINT (MTPJ) Right 06/27/2023   Procedure: FUSION, JOINT, GREAT TOE;  Surgeon: Amada Backer, MD;  Location: [facility];  Service: Orthopedics;  Laterality: Right;  general, regional (add canal / pop / no exparel ) 90 MIN   ARTHROSCOPIC REPAIR ACL Left    BONE EXOSTOSIS EXCISION Right 06/27/2023   Procedure: 5TH METATARSAL EXOSTOSIS EXCISION;  Surgeon: Amada Backer, MD;  Location: [facility];  Service: Orthopedics;  Laterality: Right;   BREAST EXCISIONAL BIOPSY     CYST EXCISION Right 1972   Right breast/benign   FEMUR FRACTURE SURGERY Right    Plate with 5 screws   FLEXOR TENDON REPAIR Right 06/27/2023   Procedure: FLEXOR AND EXTENSOR TENDON REPAIR;  Surgeon: Amada Backer, MD;  Location: [facility];  Service: Orthopedics;  Laterality: Right;   HAMMERTOE RECONSTRUCTION WITH WEIL OSTEOTOMY Right 06/27/2023   Procedure: 2nd HAMMERTOE RECONSTRUCTION WITH WEIL OSTEOTOMY;  Surgeon: Amada Backer, MD;  Location: [facility];  Service: Orthopedics;  Laterality: Right;   INGUINAL HERNIA REPAIR  11/2007   TONSILLECTOMY     TOTAL KNEE ARTHROPLASTY Left 06/12/2022   Procedure: LEFT TOTAL KNEE ARTHROPLASTY;  Surgeon: Arnie Lao, MD;  Location: [facility];  Service: Orthopedics;  Laterality: Left;   VULVAR LESION REMOVAL  07/2007   benign   Family History  Problem Relation Age of Onset   CAD Mother    Osteoporosis Mother    Diabetes Father    GER disease Father    Rheum arthritis Sister    Asthma Sister    Diabetes Maternal Grandmother        amputation   Colon cancer Neg Hx    Colon polyps Neg Hx    Crohn's disease Neg Hx    Esophageal cancer Neg Hx    Rectal cancer Neg Hx    Stomach cancer Neg Hx    Ulcerative colitis Neg Hx    Social History   Socioeconomic History   Marital status: Married    Spouse name: Not on file   Number of children: Not on file   Years of education: Not on file   Highest  education level: Associate degree: occupational, Scientist, product/process development, or vocational program  Occupational History   Not on file  Tobacco Use   Smoking status: Former    Current packs/day: 0.00    Types: Cigarettes    Quit date: 1991    Years since quitting: 34.4    Passive exposure: Past   Smokeless tobacco: Never  Vaping Use   Vaping status: Never Used  Substance and Sexual Activity   Alcohol use: Yes    Comment: social   Drug use: Never   Sexual activity: Yes    Partners: Male    Birth control/protection: None, Post-menopausal, Surgical    Comment: hyst  Other Topics Concern  Not on file  Social History Narrative   ** Merged History Encounter **       Social Drivers of Health   Financial Resource Strain: Low Risk  (09/04/2023)   Overall Financial Resource Strain (CARDIA)    Difficulty of Paying Living Expenses: Not hard at all  Food Insecurity: No Food Insecurity (09/04/2023)   Hunger Vital Sign    Worried About Running Out of Food in the Last Year: Never true    Ran Out of Food in the Last Year: Never true  Transportation Needs: No Transportation Needs (09/04/2023)   PRAPARE - Administrator, Civil Service (Medical): No    Lack of Transportation (Non-Medical): No  Physical Activity: Inactive (09/04/2023)   Exercise Vital Sign    Days of Exercise per Week: 0 days    Minutes of Exercise per Session: 0 min  Stress: No Stress Concern Present (09/04/2023)   [employer] of Occupational Health - Occupational Stress Questionnaire    Feeling of Stress : Not at all  Social Connections: Moderately Integrated (09/04/2023)   Social Connection and Isolation Panel [NHANES]    Frequency of Communication with Friends and Family: More than three times a week    Frequency of Social Gatherings with Friends and Family: More than three times a week    Attends Religious Services: More than 4 times per year    Active Member of [employer] or Organizations: No    Attends Museum/gallery exhibitions officer: Never    Marital Status: Married    Tobacco Counseling Counseling given: Not Answered    Clinical Intake:  Pre-visit preparation completed: Yes  Pain : 0-10 Pain Score: 2  Pain Type: Acute pain Pain Location: Toe (Comment which one) Pain Orientation: Right Pain Descriptors / Indicators: Aching Pain Onset: 1 to 4 weeks ago     BMI - recorded: 23 Nutritional Risks: None Diabetes: No  Lab Results  Component Value Date   HGBA1C 6.1 05/21/2018   HGBA1C 6.0 05/11/2017     How often do you need to have someone help you when you read instructions, pamphlets, or other written materials from your doctor or pharmacy?: 1 - Never  Interpreter Needed?: No  Information entered by :: Lamont Pilsner, LPN   Activities of Daily Living     09/03/2023   11:28 AM 06/27/2023    7:31 AM  In your present state of health, do you have any difficulty performing the following activities:  Hearing? 0 0  Vision? 0 0  Difficulty concentrating or making decisions? 0 0  Walking or climbing stairs? 0   Dressing or bathing? 0   Doing errands, shopping? 0   Preparing Food and eating ? N   Using the Toilet? N   In the past six months, have you accidently leaked urine? N   Do you have problems with loss of bowel control? N   Managing your Medications? N   Managing your Finances? N   Housekeeping or managing your Housekeeping? N     Patient Care Team: Luevenia Saha, MD as PCP - General (Family Medicine) Florina Husbands, MD (Inactive) as Consulting Physician (Hematology) Rolando Cliche, [facility] as Pharmacist (Pharmacist) Sergio Dandy, MD as Consulting Physician (Gastroenterology)  Indicate any recent Medical Services you may have received from other than [facility] providers in the past year (date may be approximate).     Assessment:    This is a routine wellness examination for [facility].  Hearing/Vision screen Hearing  Screening - Comments:: Pt denies any hearing issues   Vision Screening - Comments:: Wears rx glasses - up to date with routine eye exams with [facility]     Goals Addressed             This Visit's Progress    Patient Stated       Healthy diet and eat better        Depression Screen     09/04/2023    8:46 AM 07/12/2023   10:09 AM 05/14/2023    8:40 AM 05/03/2022   10:35 AM 11/29/2021    2:34 PM 07/28/2021    3:08 PM 04/19/2021    1:11 PM  PHQ 2/9 Scores  PHQ - 2 Score 0 0 0 1 0 0 0  PHQ- 9 Score    4       Fall Risk     09/03/2023   11:28 AM 07/12/2023   10:09 AM 05/14/2023    8:40 AM 05/03/2022   10:34 AM 11/29/2021    2:31 PM  Fall Risk   Falls in the past year? 0 0 0 1 1  Number falls in past yr: 0 0 0 1 0  Injury with Fall? 0 0 0 0 1  Comment     Hit head  Risk for fall due to : Impaired mobility;Impaired balance/gait No Fall Risks No Fall Risks No Fall Risks History of fall(s)  Risk for fall due to: Comment just had foot surgery      Follow up Falls prevention discussed Falls evaluation completed Falls evaluation completed;Education provided Falls evaluation completed Falls evaluation completed    MEDICARE RISK AT HOME:  Medicare Risk at Home Any stairs in or around the home?: (Patient-Rptd) No If so, are there any without handrails?: (Patient-Rptd) No Home free of loose throw rugs in walkways, pet beds, electrical cords, etc?: (Patient-Rptd) Yes Adequate lighting in your home to reduce risk of falls?: (Patient-Rptd) Yes Life alert?: (Patient-Rptd) No Use of a cane, walker or w/c?: (Patient-Rptd) No Grab bars in the bathroom?: (Patient-Rptd) Yes Shower chair or bench in shower?: (Patient-Rptd) No Elevated toilet seat or a handicapped toilet?: (Patient-Rptd) No  TIMED UP AND GO:  Was the test performed?  No  Cognitive Function: 6CIT completed        09/04/2023    8:51 AM 07/28/2021    3:17 PM  6CIT Screen  What Year? 0 points 0 points  What month? 0 points 0 points  What time? 0 points 0 points  Count back  from 20 0 points 0 points  Months in reverse 0 points 0 points  Repeat phrase 0 points 0 points  Total Score 0 points 0 points    Immunizations Immunization History  Administered Date(s) Administered   Fluad Quad(high Dose 65+) 02/07/2022   Influenza Split 11/28/2022   Influenza, High Dose Seasonal PF 12/21/2019, 12/08/2020   Influenza-Unspecified 04/28/2014, 05/09/2015, 05/15/2016, 05/20/2017, 05/22/2018, 11/21/2018   PFIZER Comirnaty(Gray Top)Covid-19 Tri-Sucrose Vaccine 07/26/2020   PFIZER(Purple Top)SARS-COV-2 Vaccination 05/24/2019, 06/16/2019, 03/16/2020, 10/27/2022   Pfizer(Comirnaty)Fall Seasonal Vaccine 12 years and older 02/07/2022   Pneumococcal Conjugate-13 10/01/2017   Pneumococcal Polysaccharide-23 11/21/2018   Tdap 05/15/2016   Zoster Recombinant(Shingrix ) 09/30/2019, 12/21/2019   Zoster, Live 04/28/2014    Screening Tests Health Maintenance  Topic Date Due   COVID-19 Vaccine (7 - 2024-25 season) 12/22/2022   INFLUENZA VACCINE  11/08/2023   MAMMOGRAM  03/17/2024   Medicare Annual Wellness (AWV)  09/03/2024   DEXA  SCAN  11/20/2024   DTaP/Tdap/Td (2 - Td or Tdap) 05/15/2026   Colonoscopy  01/14/2028   Pneumonia Vaccine 42+ Years old  Completed   Hepatitis C Screening  Completed   Zoster Vaccines- Shingrix   Completed   HPV VACCINES  Aged Out   Meningococcal B Vaccine  Aged Out    Health Maintenance  Health Maintenance Due  Topic Date Due   COVID-19 Vaccine (7 - 2024-25 season) 12/22/2022   Health Maintenance Items Addressed: See Nurse Notes  Additional Screening:  Vision Screening: Recommended annual ophthalmology exams for early detection of glaucoma and other disorders of the eye.  Dental Screening: Recommended annual dental exams for proper oral hygiene  Community Resource Referral / Chronic Care Management: CRR required this visit?  No   CCM required this visit?  No   Plan:    I have personally reviewed and noted the following in the  patient's chart:   Medical and social history Use of alcohol, tobacco or illicit drugs  Current medications and supplements including opioid prescriptions. Patient is not currently taking opioid prescriptions. Functional ability and status Nutritional status Physical activity Advanced directives List of other physicians Hospitalizations, surgeries, and ER visits in previous 12 months Vitals Screenings to include cognitive, depression, and falls Referrals and appointments  In addition, I have reviewed and discussed with patient certain preventive protocols, quality metrics, and best practice recommendations. A written personalized care plan for preventive services as well as general preventive health recommendations were provided to patient.   Bruno Capri, LPN   [id number]/[id number]   After Visit Summary: (MyChart) Due to this being a telephonic visit, the after visit summary with patients personalized plan was offered to patient via MyChart   Notes: Please refer to Routing Comments.

## 2023-09-04 NOTE — Patient Instructions (Signed)
 Ms. Nicks , Thank you for taking time out of your busy schedule to complete your Annual Wellness Visit with me. I enjoyed our conversation and look forward to speaking with you again next year. I, as well as your care team,  appreciate your ongoing commitment to your health goals. Please review the following plan we discussed and let me know if I can assist you in the future. Your Game plan/ To Do List    Referrals: If you haven't heard from the office you've been referred to, please reach out to them at the phone provided.   Follow up Visits: Next Medicare AWV with our clinical staff: 09/11/23   Have you seen your provider in the last 6 months (3 months if uncontrolled diabetes)? Yes Next Office Visit with your provider: 07/14/24  Clinician Recommendations:  Each day, aim for 6 glasses of water, plenty of protein in your diet and try to get up and walk/ stretch every hour for 5-10 minutes at a time.        This is a list of the screening recommended for you and due dates:  Health Maintenance  Topic Date Due   Medicare Annual Wellness Visit  07/29/2022   COVID-19 Vaccine (7 - 2024-25 season) 12/22/2022   Flu Shot  11/08/2023   Mammogram  03/17/2024   DEXA scan (bone density measurement)  11/20/2024   DTaP/Tdap/Td vaccine (2 - Td or Tdap) 05/15/2026   Colon Cancer Screening  01/14/2028   Pneumonia Vaccine  Completed   Hepatitis C Screening  Completed   Zoster (Shingles) Vaccine  Completed   HPV Vaccine  Aged Out   Meningitis B Vaccine  Aged Out    Advanced directives: (Copy Requested) Please bring a copy of your health care power of attorney and living will to the office to be added to your chart at your convenience. You can mail to [facility] [address] [place], [place] [address] or email to [email or web address] Advance Care Planning is important because it:  [x]  Makes sure you receive the medical care that is consistent with your values, goals, and  preferences  [x]  It provides guidance to your family and loved ones and reduces their decisional burden about whether or not they are making the right decisions based on your wishes.  Follow the link provided in your after visit summary or read over the paperwork we have mailed to you to help you started getting your Advance Directives in place. If you need assistance in completing these, please reach out to us  so that we can help you!  See attachments for Preventive Care and Fall Prevention Tips.

## 2023-09-06 DIAGNOSIS — M79671 Pain in right foot: Secondary | ICD-10-CM | POA: Diagnosis not present

## 2023-09-06 NOTE — Telephone Encounter (Signed)
 Noted

## 2023-09-11 DIAGNOSIS — M79671 Pain in right foot: Secondary | ICD-10-CM | POA: Diagnosis not present

## 2023-09-13 DIAGNOSIS — Z4789 Encounter for other orthopedic aftercare: Secondary | ICD-10-CM | POA: Diagnosis not present

## 2023-09-13 DIAGNOSIS — M2041 Other hammer toe(s) (acquired), right foot: Secondary | ICD-10-CM | POA: Diagnosis not present

## 2023-09-13 DIAGNOSIS — M2011 Hallux valgus (acquired), right foot: Secondary | ICD-10-CM | POA: Diagnosis not present

## 2023-09-13 DIAGNOSIS — M7741 Metatarsalgia, right foot: Secondary | ICD-10-CM | POA: Diagnosis not present

## 2023-11-01 DIAGNOSIS — L603 Nail dystrophy: Secondary | ICD-10-CM | POA: Diagnosis not present

## 2023-11-01 DIAGNOSIS — M2011 Hallux valgus (acquired), right foot: Secondary | ICD-10-CM | POA: Diagnosis not present

## 2023-11-04 ENCOUNTER — Ambulatory Visit (INDEPENDENT_AMBULATORY_CARE_PROVIDER_SITE_OTHER): Admitting: Family Medicine

## 2023-11-04 ENCOUNTER — Encounter: Payer: Self-pay | Admitting: Family Medicine

## 2023-11-04 VITALS — BP 115/70 | HR 64 | Temp 97.7°F | Ht 64.0 in | Wt 134.8 lb

## 2023-11-04 DIAGNOSIS — D373 Neoplasm of uncertain behavior of appendix: Secondary | ICD-10-CM | POA: Diagnosis not present

## 2023-11-04 DIAGNOSIS — Z8509 Personal history of malignant neoplasm of other digestive organs: Secondary | ICD-10-CM | POA: Diagnosis not present

## 2023-11-04 DIAGNOSIS — L700 Acne vulgaris: Secondary | ICD-10-CM | POA: Diagnosis not present

## 2023-11-04 NOTE — Progress Notes (Signed)
 Subjective  CC:  Chief Complaint  Patient presents with   ingrown hair on labia    HPI: Kristen Stephenson is a 73 y.o. female who presents to the office today to address the problems listed above in the chief complaint. 73 year old mostly healthy female noticed some black areas on her labia.  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  She has been cured of this but because of her cancer history was worried that this needed to be checked.  She has no pain.  No redness.  Just noticed these small lesions of concern.  Otherwise feels fine.  Assessment  1. Open comedone   2. History of malignant neoplasm of appendix   3. Low grade mucinous neoplasm of appendix      Plan  Open comedones, blackheads of vulva: Reassured.  No worrisome symptoms or findings.  Follow up: As needed Visit date not found  No orders of the defined types were placed in this encounter.  No orders of the defined types were placed in this encounter.     I reviewed the patients updated PMH, FH, and SocHx.    Patient Active Problem List   Diagnosis Date Noted   History of malignant neoplasm of appendix     Priority: High   Tubular adenoma of colon 07/12/2023    Priority: High   Barrett's esophagus without dysplasia 04/19/2021    Priority: High   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    Priority: High   Mixed hyperlipidemia 09/16/2018    Priority: High   Lumbar and sacral spondyloarthritis 05/03/2022    Priority: Medium    Postcoital UTI - recurrent 02/03/2020    Priority: Medium    Osteopenia after menopause 09/01/2019    Priority: Medium    Anxiety 09/16/2018    Priority: Medium    GERD (gastroesophageal reflux disease) 09/16/2018    Priority: Medium    Status post total left knee replacement 06/12/2022    Priority: Low   Vitamin B12 deficiency 05/14/2022    Priority: Low    Long-term current use of proton pump inhibitor therapy 05/03/2022    Priority: Low   Pulmonary nodules 09/23/2019    Priority: Low   Presbycusis of both ears 09/23/2019    Priority: Low   Current Meds  Medication Sig   ABRYSVO 120 MCG/0.5ML injection    Calcium Carb-Cholecalciferol (CALCIUM 600 + D PO) Take 1 tablet by mouth daily.   Cholecalciferol (VITAMIN D3) 50 MCG (2000 UT) CAPS Take 2,000 Units by mouth daily.   cyanocobalamin  (VITAMIN B12) 1000 MCG tablet Take 1,000 mcg by mouth daily.   pantoprazole  (PROTONIX ) 40 MG tablet Take 1 tablet (40 mg total) by mouth daily.   simvastatin  (ZOCOR ) 40 MG tablet Take 1 tablet (40 mg total) by mouth at bedtime.    Allergies: Patient has no known allergies. Family History: Patient family history includes Asthma in her sister; CAD in her mother; Diabetes in her father and maternal grandmother; GER disease in her father; Osteoporosis in her mother; Rheum arthritis in her sister. Social History:  Patient  reports that she quit smoking about 34 years ago. Her smoking use included cigarettes. She has been exposed to tobacco smoke. She has never used smokeless tobacco. She reports current alcohol use. She reports that she does not use drugs.  Review of Systems: Constitutional: Negative for fever malaise or anorexia Cardiovascular:  negative for chest pain Respiratory: negative for SOB or persistent cough Gastrointestinal: negative for abdominal pain  Objective  Vitals: BP 115/70   Pulse 64   Temp 97.7 F (36.5 C)   Ht 5' 4 (1.626 m)   Wt 134 lb 12.8 oz (61.1 kg)   SpO2 96%   BMI 23.14 kg/m  General: no acute distress , A&Ox3 Right vulva with 3-4 open flat comedones.   benign-appearing.  1 was easily expressed.  No groin lymphadenopathy.  No other lesions visible.  Commons side effects, risks, benefits, and alternatives for medications and treatment plan prescribed today were discussed, and the patient expressed understanding of the  given instructions. Patient is instructed to call or message via MyChart if he/she has any questions or concerns regarding our treatment plan. No barriers to understanding were identified. We discussed Red Flag symptoms and signs in detail. Patient expressed understanding regarding what to do in case of urgent or emergency type symptoms.  Medication list was reconciled, printed and provided to the patient in AVS. Patient instructions and summary information was reviewed with the patient as documented in the AVS. This note was prepared with assistance of Dragon voice recognition software. Occasional wrong-word or sound-a-like substitutions may have occurred due to the inherent limitations of voice recognition software

## 2024-01-23 ENCOUNTER — Ambulatory Visit: Payer: Self-pay | Admitting: Hematology & Oncology

## 2024-01-23 ENCOUNTER — Encounter: Payer: Self-pay | Admitting: Hematology & Oncology

## 2024-01-23 ENCOUNTER — Inpatient Hospital Stay: Payer: PPO | Attending: Hematology & Oncology

## 2024-01-23 ENCOUNTER — Inpatient Hospital Stay: Payer: PPO | Admitting: Hematology & Oncology

## 2024-01-23 VITALS — BP 127/72 | HR 62 | Temp 97.9°F | Resp 18 | Wt 136.0 lb

## 2024-01-23 DIAGNOSIS — D373 Neoplasm of uncertain behavior of appendix: Secondary | ICD-10-CM

## 2024-01-23 DIAGNOSIS — Z85038 Personal history of other malignant neoplasm of large intestine: Secondary | ICD-10-CM | POA: Insufficient documentation

## 2024-01-23 LAB — CBC WITH DIFFERENTIAL (CANCER CENTER ONLY)
Abs Immature Granulocytes: 0.01 K/uL (ref 0.00–0.07)
Basophils Absolute: 0 K/uL (ref 0.0–0.1)
Basophils Relative: 0 %
Eosinophils Absolute: 0.1 K/uL (ref 0.0–0.5)
Eosinophils Relative: 2 %
HCT: 43.6 % (ref 36.0–46.0)
Hemoglobin: 14.2 g/dL (ref 12.0–15.0)
Immature Granulocytes: 0 %
Lymphocytes Relative: 20 %
Lymphs Abs: 1.2 K/uL (ref 0.7–4.0)
MCH: 30.1 pg (ref 26.0–34.0)
MCHC: 32.6 g/dL (ref 30.0–36.0)
MCV: 92.4 fL (ref 80.0–100.0)
Monocytes Absolute: 0.4 K/uL (ref 0.1–1.0)
Monocytes Relative: 7 %
Neutro Abs: 4.2 K/uL (ref 1.7–7.7)
Neutrophils Relative %: 71 %
Platelet Count: 246 K/uL (ref 150–400)
RBC: 4.72 MIL/uL (ref 3.87–5.11)
RDW: 12.4 % (ref 11.5–15.5)
WBC Count: 5.9 K/uL (ref 4.0–10.5)
nRBC: 0 % (ref 0.0–0.2)

## 2024-01-23 LAB — CMP (CANCER CENTER ONLY)
ALT: 11 U/L (ref 0–44)
AST: 22 U/L (ref 15–41)
Albumin: 4.7 g/dL (ref 3.5–5.0)
Alkaline Phosphatase: 61 U/L (ref 38–126)
Anion gap: 10 (ref 5–15)
BUN: 16 mg/dL (ref 8–23)
CO2: 30 mmol/L (ref 22–32)
Calcium: 10.1 mg/dL (ref 8.9–10.3)
Chloride: 104 mmol/L (ref 98–111)
Creatinine: 0.84 mg/dL (ref 0.44–1.00)
GFR, Estimated: >60 mL/min (ref >60)
Glucose, Bld: 102 mg/dL — ABNORMAL HIGH (ref 70–99)
Potassium: 4.4 mmol/L (ref 3.5–5.1)
Sodium: 143 mmol/L (ref 135–145)
Total Bilirubin: 0.6 mg/dL (ref 0.0–1.2)
Total Protein: 7 g/dL (ref 6.5–8.1)

## 2024-01-23 LAB — LACTATE DEHYDROGENASE: LDH: 217 U/L — ABNORMAL HIGH (ref 98–192)

## 2024-01-23 LAB — CEA (ACCESS): CEA (CHCC): 1.66 ng/mL (ref 0.00–5.00)
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
## 2024-01-29 ENCOUNTER — Other Ambulatory Visit: Payer: Self-pay | Admitting: Family Medicine

## 2024-01-29 DIAGNOSIS — Z1231 Encounter for screening mammogram for malignant neoplasm of breast: Secondary | ICD-10-CM

## 2024-02-10 ENCOUNTER — Encounter: Payer: Self-pay | Admitting: Radiology

## 2024-03-19 ENCOUNTER — Ambulatory Visit
Admission: RE | Admit: 2024-03-19 | Discharge: 2024-03-19 | Disposition: A | Discharge status: Home / Self Care | Source: Ambulatory Visit | Attending: Family Medicine | Admitting: Family Medicine

## 2024-03-19 DIAGNOSIS — Z1231 Encounter for screening mammogram for malignant neoplasm of breast: Secondary | ICD-10-CM

## 2024-03-26 ENCOUNTER — Encounter: Payer: Self-pay | Admitting: Pharmacist

## 2024-03-26 NOTE — Progress Notes (Signed)
 Pharmacy Quality Measure Review  This patient is appearing on a report for being at risk of failing the adherence measure for cholesterol (statin) medications this calendar year.   Medication: simvastatin   Last fill date: 12/06/2023 for 90 day supply per adherence report.   Reviewed recent refill history in Dr Annemarie database. Actual last refill date was 03/03/2024 for 90 day supply. Patient has 1 refill remaining. Next appointment with PCP is 07/14/2024.    Insurance report was not up to date. No action needed at this time.   Madelin Ray, PharmD Clinical Pharmacist [facility]  Population Health [phone]

## 2024-04-01 ENCOUNTER — Other Ambulatory Visit: Payer: Self-pay | Admitting: Gastroenterology

## 2024-07-14 ENCOUNTER — Encounter: Admitting: Family Medicine

## 2024-09-10 ENCOUNTER — Ambulatory Visit

## 2025-01-22 ENCOUNTER — Inpatient Hospital Stay

## 2025-01-22 ENCOUNTER — Inpatient Hospital Stay: Admitting: Hematology & Oncology
